# Patient Record
Sex: Male | Born: 1985
Health system: Southern US, Community
[De-identification: ages and names within clinical notes are randomized; demographics above are authoritative.]

## PROBLEM LIST (undated history)

## (undated) DIAGNOSIS — E119 Type 2 diabetes mellitus without complications: Secondary | ICD-10-CM

## (undated) DIAGNOSIS — I10 Essential (primary) hypertension: Secondary | ICD-10-CM

## (undated) DIAGNOSIS — I639 Cerebral infarction, unspecified: Secondary | ICD-10-CM

## (undated) HISTORY — PX: TONSILLECTOMY: SUR1361

## (undated) HISTORY — DX: Morbid (severe) obesity due to excess calories: E66.01

---
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
## 2016-08-13 DIAGNOSIS — R930 Abnormal findings on diagnostic imaging of skull and head, not elsewhere classified: Secondary | ICD-10-CM | POA: Diagnosis not present

## 2016-08-13 DIAGNOSIS — G4482 Headache associated with sexual activity: Secondary | ICD-10-CM | POA: Diagnosis not present

## 2016-08-13 DIAGNOSIS — R51 Headache: Secondary | ICD-10-CM | POA: Diagnosis not present

## 2016-09-13 DIAGNOSIS — R9089 Other abnormal findings on diagnostic imaging of central nervous system: Secondary | ICD-10-CM | POA: Diagnosis not present

## 2016-09-13 DIAGNOSIS — G4482 Headache associated with sexual activity: Secondary | ICD-10-CM | POA: Diagnosis not present

## 2016-09-13 DIAGNOSIS — I10 Essential (primary) hypertension: Secondary | ICD-10-CM | POA: Diagnosis not present

## 2016-09-23 DIAGNOSIS — G4482 Headache associated with sexual activity: Secondary | ICD-10-CM | POA: Diagnosis not present

## 2016-09-23 DIAGNOSIS — R51 Headache: Secondary | ICD-10-CM | POA: Diagnosis not present

## 2016-09-23 DIAGNOSIS — R9089 Other abnormal findings on diagnostic imaging of central nervous system: Secondary | ICD-10-CM | POA: Diagnosis not present

## 2016-10-18 DIAGNOSIS — J101 Influenza due to other identified influenza virus with other respiratory manifestations: Secondary | ICD-10-CM | POA: Diagnosis not present

## 2016-11-07 DIAGNOSIS — G459 Transient cerebral ischemic attack, unspecified: Secondary | ICD-10-CM | POA: Diagnosis not present

## 2016-11-07 DIAGNOSIS — I638 Other cerebral infarction: Secondary | ICD-10-CM | POA: Diagnosis not present

## 2016-11-22 DIAGNOSIS — I67841 Reversible cerebrovascular vasoconstriction syndrome: Secondary | ICD-10-CM | POA: Diagnosis not present

## 2016-11-22 DIAGNOSIS — I10 Essential (primary) hypertension: Secondary | ICD-10-CM | POA: Diagnosis not present

## 2016-11-22 DIAGNOSIS — G4482 Headache associated with sexual activity: Secondary | ICD-10-CM | POA: Diagnosis not present

## 2016-11-22 DIAGNOSIS — I638 Other cerebral infarction: Secondary | ICD-10-CM | POA: Diagnosis not present

## 2017-02-05 DIAGNOSIS — E119 Type 2 diabetes mellitus without complications: Secondary | ICD-10-CM | POA: Diagnosis not present

## 2017-02-05 DIAGNOSIS — I10 Essential (primary) hypertension: Secondary | ICD-10-CM | POA: Diagnosis not present

## 2017-02-05 DIAGNOSIS — Z6841 Body Mass Index (BMI) 40.0 and over, adult: Secondary | ICD-10-CM | POA: Diagnosis not present

## 2017-02-05 DIAGNOSIS — R748 Abnormal levels of other serum enzymes: Secondary | ICD-10-CM | POA: Diagnosis not present

## 2017-04-25 DIAGNOSIS — R21 Rash and other nonspecific skin eruption: Secondary | ICD-10-CM | POA: Diagnosis not present

## 2017-04-25 DIAGNOSIS — L2084 Intrinsic (allergic) eczema: Secondary | ICD-10-CM | POA: Diagnosis not present

## 2018-07-13 ENCOUNTER — Emergency Department (HOSPITAL_COMMUNITY)
Admission: EM | Admit: 2018-07-13 | Discharge: 2018-07-13 | Disposition: A | Discharge status: Home / Self Care | Payer: BLUE CROSS/BLUE SHIELD | Attending: Emergency Medicine | Admitting: Emergency Medicine

## 2018-07-13 ENCOUNTER — Other Ambulatory Visit: Payer: Self-pay

## 2018-07-13 ENCOUNTER — Encounter (HOSPITAL_COMMUNITY): Payer: Self-pay

## 2018-07-13 ENCOUNTER — Emergency Department (HOSPITAL_COMMUNITY): Payer: BLUE CROSS/BLUE SHIELD

## 2018-07-13 ENCOUNTER — Ambulatory Visit (HOSPITAL_COMMUNITY)
Admission: EM | Admit: 2018-07-13 | Discharge: 2018-07-13 | Disposition: A | Discharge status: Home / Self Care | Payer: Self-pay

## 2018-07-13 ENCOUNTER — Encounter (HOSPITAL_COMMUNITY): Payer: Self-pay | Admitting: Emergency Medicine

## 2018-07-13 DIAGNOSIS — R4182 Altered mental status, unspecified: Secondary | ICD-10-CM | POA: Diagnosis not present

## 2018-07-13 DIAGNOSIS — R05 Cough: Secondary | ICD-10-CM | POA: Diagnosis not present

## 2018-07-13 DIAGNOSIS — R51 Headache: Secondary | ICD-10-CM | POA: Diagnosis not present

## 2018-07-13 DIAGNOSIS — Z79899 Other long term (current) drug therapy: Secondary | ICD-10-CM | POA: Diagnosis not present

## 2018-07-13 DIAGNOSIS — R42 Dizziness and giddiness: Secondary | ICD-10-CM | POA: Insufficient documentation

## 2018-07-13 DIAGNOSIS — I10 Essential (primary) hypertension: Secondary | ICD-10-CM | POA: Insufficient documentation

## 2018-07-13 DIAGNOSIS — E119 Type 2 diabetes mellitus without complications: Secondary | ICD-10-CM | POA: Insufficient documentation

## 2018-07-13 DIAGNOSIS — Z8673 Personal history of transient ischemic attack (TIA), and cerebral infarction without residual deficits: Secondary | ICD-10-CM | POA: Diagnosis not present

## 2018-07-13 DIAGNOSIS — F17210 Nicotine dependence, cigarettes, uncomplicated: Secondary | ICD-10-CM | POA: Insufficient documentation

## 2018-07-13 DIAGNOSIS — J0110 Acute frontal sinusitis, unspecified: Secondary | ICD-10-CM | POA: Diagnosis not present

## 2018-07-13 HISTORY — DX: Cerebral infarction, unspecified: I63.9

## 2018-07-13 HISTORY — DX: Essential (primary) hypertension: I10

## 2018-07-13 HISTORY — DX: Type 2 diabetes mellitus without complications: E11.9

## 2018-07-13 LAB — CBC
HCT: 47 % (ref 39.0–52.0)
Hemoglobin: 16.1 g/dL (ref 13.0–17.0)
MCH: 29.9 pg (ref 26.0–34.0)
MCHC: 34.3 g/dL (ref 30.0–36.0)
MCV: 87.4 fL (ref 78.0–100.0)
PLATELETS: 318 10*3/uL (ref 150–400)
RBC: 5.38 MIL/uL (ref 4.22–5.81)
RDW: 12.2 % (ref 11.5–15.5)
WBC: 11.4 10*3/uL — ABNORMAL HIGH (ref 4.0–10.5)

## 2018-07-13 LAB — COMPREHENSIVE METABOLIC PANEL
ALBUMIN: 4.6 g/dL (ref 3.5–5.0)
ALK PHOS: 54 U/L (ref 38–126)
ALT: 68 U/L — ABNORMAL HIGH (ref 0–44)
AST: 36 U/L (ref 15–41)
Anion gap: 12 (ref 5–15)
BILIRUBIN TOTAL: 1.1 mg/dL (ref 0.3–1.2)
BUN: 11 mg/dL (ref 6–20)
CALCIUM: 9.6 mg/dL (ref 8.9–10.3)
CO2: 22 mmol/L (ref 22–32)
Chloride: 101 mmol/L (ref 98–111)
Creatinine, Ser: 1.15 mg/dL (ref 0.61–1.24)
GFR calc Af Amer: >60 mL/min (ref >60)
Glucose, Bld: 208 mg/dL — ABNORMAL HIGH (ref 70–99)
Potassium: 3.6 mmol/L (ref 3.5–5.1)
Sodium: 135 mmol/L (ref 135–145)
TOTAL PROTEIN: 7.7 g/dL (ref 6.5–8.1)

## 2018-07-13 LAB — PROTIME-INR
INR: 0.91
PROTHROMBIN TIME: 12.1 s (ref 11.4–15.2)

## 2018-07-13 LAB — I-STAT CHEM 8, ED
BUN: 11 mg/dL (ref 6–20)
CALCIUM ION: 1.14 mmol/L — AB (ref 1.15–1.40)
CREATININE: 1 mg/dL (ref 0.61–1.24)
Chloride: 102 mmol/L (ref 98–111)
GLUCOSE: 213 mg/dL — AB (ref 70–99)
HCT: 47 % (ref 39.0–52.0)
Hemoglobin: 16 g/dL (ref 13.0–17.0)
Potassium: 3.6 mmol/L (ref 3.5–5.1)
Sodium: 138 mmol/L (ref 135–145)
TCO2: 27 mmol/L (ref 22–32)

## 2018-07-13 LAB — DIFFERENTIAL
Abs Immature Granulocytes: 0.1 10*3/uL (ref 0.0–0.1)
BASOS PCT: 1 %
Basophils Absolute: 0.1 10*3/uL (ref 0.0–0.1)
EOS ABS: 0.2 10*3/uL (ref 0.0–0.7)
EOS PCT: 2 %
IMMATURE GRANULOCYTES: 1 %
Lymphocytes Relative: 25 %
Lymphs Abs: 2.8 10*3/uL (ref 0.7–4.0)
Monocytes Absolute: 0.8 10*3/uL (ref 0.1–1.0)
Monocytes Relative: 7 %
NEUTROS PCT: 64 %
Neutro Abs: 7.4 10*3/uL (ref 1.7–7.7)

## 2018-07-13 LAB — APTT: aPTT: 31 seconds (ref 24–36)

## 2018-07-13 LAB — I-STAT TROPONIN, ED: Troponin i, poc: 0 ng/mL (ref 0.00–0.08)

## 2018-07-13 MED ORDER — LISINOPRIL 10 MG PO TABS: 10.0000 mg | ORAL_TABLET | Freq: Every day | ORAL | 0 refills | Status: DC

## 2018-07-13 MED ORDER — LISINOPRIL 10 MG PO TABS
10.0000 mg | ORAL_TABLET | Freq: Once | ORAL | Status: AC
  Administered 2018-07-13: 10 mg via ORAL
  Filled 2018-07-13: qty 1

## 2018-07-13 MED ORDER — AMOXICILLIN-POT CLAVULANATE 875-125 MG PO TABS: 1.0000 | ORAL_TABLET | Freq: Two times a day (BID) | ORAL | 0 refills | Status: DC

## 2018-07-13 NOTE — ED Triage Notes (Signed)
PT reports history of hypertension, diabetes, TIA.   PT has had sinus symptoms for several days.   PT took a claritin at 0600 this AM and then took a nap.  PT noticed problems concentrating 0900. PT woke up from his nap feeling "spacy" and "not feeling right."  PT has been off of BP meds for 6 months.

## 2018-07-13 NOTE — Discharge Instructions (Signed)
Take tylenol 2 pills 4 times a day and motrin 4 pills 3 times a day.  Drink plenty of fluids.  Return for worsening shortness of breath, headache, confusion. Follow up with your family doctor.   

## 2018-07-13 NOTE — ED Triage Notes (Signed)
Per Dr. Milus Glazier, Pt sent to ED for work up based on past medical history. PT elects to walk over without assistance.

## 2018-07-13 NOTE — ED Provider Notes (Signed)
[facility] EMERGENCY DEPARTMENT Provider Note   CSN: [id number] Arrival date & time: 07/13/18  1237     History   Chief Complaint Chief Complaint  Patient presents with  . Hypertension    HPI Jared Ramsey is a 32 y.o. male.  32 yo M with a chief complaint of cough and congestion.  This been going on for the past couple days.  The patient took some Sudafed last night and then today he was talking to a friend on the phone and felt like he was having trouble keeping track of the conversation.  He felt somewhat lightheaded where he felt like he was high.  He denies fevers denies head injury.  The patient has a history of a remote stroke that was found on MRI.  This made him more concerned about his difficulty keeping track of the conversation.  He denies difficulty with speech or swallowing denies unilateral numbness or weakness.  He went to urgent care and they found his blood pressure to be significantly elevated and sent him here.   Hypertension  Associated symptoms include headaches. Pertinent negatives include no chest pain, no abdominal pain and no shortness of breath.    Past Medical History:  Diagnosis Date  . Diabetes mellitus without complication (HCC)   . Hypertension   . Stroke [facility])    "mini stroke"    There are no active problems to display for this patient.   Past Surgical History:  Procedure Laterality Date  . TONSILLECTOMY          Home Medications    Prior to Admission medications   Medication Sig Start Date End Date Taking? Authorizing Provider  amoxicillin-clavulanate (AUGMENTIN) 875-125 MG tablet Take 1 tablet by mouth 2 (two) times daily. 07/13/18   Melene Plan, DO  aspirin 81 MG chewable tablet Chew by mouth daily.    [provider]  lisinopril (PRINIVIL,ZESTRIL) 10 MG tablet Take 1 tablet (10 mg total) by mouth daily. 07/13/18   Melene Plan, DO  loratadine (CLARITIN) 10 MG tablet Take 10 mg by mouth daily.    [provider]    Family History History reviewed. No pertinent family history.  Social History Social History   Tobacco Use  . Smoking status: Current Every Day Smoker    Packs/day: 0.50    Types: Cigarettes  Substance Use Topics  . Alcohol use: Yes    Comment: occ  . Drug use: Never     Allergies   Codeine   Review of Systems Review of Systems  Constitutional: Negative for chills and fever.  HENT: Positive for congestion. Negative for facial swelling.   Eyes: Negative for discharge and visual disturbance.  Respiratory: Positive for cough. Negative for shortness of breath.   Cardiovascular: Negative for chest pain and palpitations.  Gastrointestinal: Negative for abdominal pain, diarrhea and vomiting.  Musculoskeletal: Negative for arthralgias and myalgias.  Skin: Negative for color change and rash.  Neurological: Positive for headaches. Negative for tremors and syncope.  Psychiatric/Behavioral: Negative for confusion and dysphoric mood.     Physical Exam Updated Vital Signs BP (!) 153/99   Pulse 97   Temp 99 F (37.2 C) (Oral)   Resp 20   Ht 6\' 1"  (1.854 m)   Wt (!) 142.9 kg   SpO2 97%   BMI 41.56 kg/m   Physical Exam  Constitutional: He is oriented to person, place, and time. He appears well-developed and well-nourished.  HENT:  Head: Normocephalic and  atraumatic.  Eyes: Pupils are equal, round, and reactive to light. EOM are normal.  Fundoscopic exam:      The right eye shows no papilledema.       The left eye shows no papilledema.  Neck: Normal range of motion. Neck supple. No JVD present.  Cardiovascular: Normal rate and regular rhythm. Exam reveals no gallop and no friction rub.  No murmur heard. Pulmonary/Chest: No respiratory distress. He has no wheezes.  Abdominal: He exhibits no distension. There is no rebound and no guarding.  Musculoskeletal: Normal range of motion.  Neurological: He is alert and oriented to person, place, and time. He  has normal strength. No cranial nerve deficit or sensory deficit. He displays a negative Romberg sign. Coordination and gait normal.  Skin: No rash noted. No pallor.  Psychiatric: He has a normal mood and affect. His behavior is normal.  Nursing note and vitals reviewed.    ED Treatments / Results  Labs (all labs ordered are listed, but only abnormal results are displayed) Labs Reviewed  CBC - Abnormal; Notable for the following components:      Result Value   WBC 11.4 (*)    All other components within normal limits  COMPREHENSIVE METABOLIC PANEL - Abnormal; Notable for the following components:   Glucose, Bld 208 (*)    ALT 68 (*)    All other components within normal limits  I-STAT CHEM 8, ED - Abnormal; Notable for the following components:   Glucose, Bld 213 (*)    Calcium, Ion 1.14 (*)    All other components within normal limits  PROTIME-INR  APTT  DIFFERENTIAL  I-STAT TROPONIN, ED    EKG EKG Interpretation  Date/Time:  Monday July 13 2018 12:53:58 EDT Ventricular Rate:  111 PR Interval:  156 QRS Duration: 80 QT Interval:  330 QTC Calculation: 448 R Axis:   64 Text Interpretation:  Sinus tachycardia Anterior infarct , age undetermined Abnormal ECG No old tracing to compare Confirmed by Melene Plan [phone]) on 07/13/2018 1:27:00 PM Also confirmed by Melene Plan [phone]), editor Barbette Hair [phone])  on 07/13/2018 1:33:42 PM   Radiology Ct Head Wo Contrast  Result Date: 07/13/2018 CLINICAL DATA:  Hypertension with altered mental status EXAM: CT HEAD WITHOUT CONTRAST TECHNIQUE: Contiguous axial images were obtained from the base of the skull through the vertex without intravenous contrast. COMPARISON:  None. FINDINGS: Brain: The ventricles are normal in size and configuration. There is no intracranial mass, hemorrhage, extra-axial fluid collection, or midline shift. Brain parenchyma appears normal. No evident acute infarct. Vascular: No hyperdense vessel. No appreciable  vascular calcification. Skull: Bony calvarium appears intact. Sinuses/Orbits: There is slight mucosal thickening in each maxillary antrum. There is mucosal thickening in several ethmoid air cells. Other visualized paranasal sinuses are clear. Orbits appear symmetric bilaterally. Other: Mastoid air cells are clear. IMPRESSION: Study within normal limits. Electronically Signed   By: Bretta Bang III M.D.   On: 07/13/2018 13:34    Procedures Procedures (including critical care time)  Medications Ordered in ED Medications  lisinopril (PRINIVIL,ZESTRIL) tablet 10 mg (has no administration in time range)     Initial Impression / Assessment and Plan / ED Course  I have reviewed the triage vital signs and the nursing notes.  Pertinent labs & imaging results that were available during my care of the patient were reviewed by me and considered in my medical decision making (see chart for details).      32 yo M with a  chief complaint of hypertension and difficulty keeping track of the conversation.  He denied any aphasia without event.  He now feels better and his blood pressure is significantly improved.  This without any intervention.  He is to be on lisinopril but stopped taking it and is waiting to see a new physician.  He has a benign neurologic exam.  I suspect his hypertension was due to him taking Sudafed.  No chest pain, diaphoresis, nausea or other acs symptoms. Normal fundiscopic exam, no unequal pulses, normal pulse ox without rales or sob.  Feel this is unlikely to be a Hypertensive Emergency and recent studies suggest no benefit for inpatient admission.  There are also no studies to my knowledge suggesting that patients with hypertensive urgency have increased risk for end organ disease.The patient will follow up closely with their PCP.  Compliance with their medication stressed.    Chester Holstein, Christell Constant EH, et al. Characteristics and outcomes of patients presenting with  hypertensive urgency in the office setting. JAMA Intern Med. 2016 Jul 1; 176(7): 981-8.    2:26 PM:  I have discussed the diagnosis/risks/treatment options with the patient and believe the pt to be eligible for discharge home to follow-up with PCP. We also discussed returning to the ED immediately if new or worsening sx occur. We discussed the sx which are most concerning (e.g., sudden worsening pain, fever, inability to tolerate by mouth) that necessitate immediate return. Medications administered to the patient during their visit and any new prescriptions provided to the patient are listed below.  Medications given during this visit Medications  lisinopril (PRINIVIL,ZESTRIL) tablet 10 mg (has no administration in time range)      The patient appears reasonably screen and/or stabilized for discharge and I doubt any other medical condition or other [facility] requiring further screening, evaluation, or treatment in the ED at this time prior to discharge.    Final Clinical Impressions(s) / ED Diagnoses   Final diagnoses:  Essential hypertension  Acute frontal sinusitis, recurrence not specified    ED Discharge Orders         Ordered    amoxicillin-clavulanate (AUGMENTIN) 875-125 MG tablet  2 times daily     07/13/18 1421    lisinopril (PRINIVIL,ZESTRIL) 10 MG tablet  Daily     07/13/18 1421           Melene Plan, DO 07/13/18 1426

## 2018-07-13 NOTE — ED Triage Notes (Signed)
Pt endorses sinus issues x 2 days, this morning pt was working and states "something didn't feel right in my head" Went to [facility] and BP 190 systolic. Pt states "I wasn't tracking people's conversations correctly while I was at work" Pt has hx of TIA 1 year ago and hx of htn and was on meds but stopped taking them due to changing doctors. Axox4. No neuro deficits.

## 2018-07-14 ENCOUNTER — Encounter (HOSPITAL_COMMUNITY): Payer: Self-pay

## 2018-07-14 ENCOUNTER — Emergency Department (HOSPITAL_COMMUNITY): Payer: BLUE CROSS/BLUE SHIELD

## 2018-07-14 ENCOUNTER — Emergency Department (HOSPITAL_COMMUNITY)
Admission: EM | Admit: 2018-07-14 | Discharge: 2018-07-15 | Disposition: A | Discharge status: Home / Self Care | Payer: BLUE CROSS/BLUE SHIELD | Attending: Emergency Medicine | Admitting: Emergency Medicine

## 2018-07-14 DIAGNOSIS — R51 Headache: Secondary | ICD-10-CM | POA: Diagnosis not present

## 2018-07-14 DIAGNOSIS — F17210 Nicotine dependence, cigarettes, uncomplicated: Secondary | ICD-10-CM | POA: Diagnosis not present

## 2018-07-14 DIAGNOSIS — E119 Type 2 diabetes mellitus without complications: Secondary | ICD-10-CM | POA: Diagnosis not present

## 2018-07-14 DIAGNOSIS — Z7982 Long term (current) use of aspirin: Secondary | ICD-10-CM | POA: Insufficient documentation

## 2018-07-14 DIAGNOSIS — G9389 Other specified disorders of brain: Secondary | ICD-10-CM | POA: Diagnosis not present

## 2018-07-14 DIAGNOSIS — Z720 Tobacco use: Secondary | ICD-10-CM | POA: Diagnosis not present

## 2018-07-14 DIAGNOSIS — I10 Essential (primary) hypertension: Secondary | ICD-10-CM | POA: Insufficient documentation

## 2018-07-14 DIAGNOSIS — E1169 Type 2 diabetes mellitus with other specified complication: Secondary | ICD-10-CM

## 2018-07-14 DIAGNOSIS — Z79899 Other long term (current) drug therapy: Secondary | ICD-10-CM | POA: Insufficient documentation

## 2018-07-14 DIAGNOSIS — I6523 Occlusion and stenosis of bilateral carotid arteries: Secondary | ICD-10-CM | POA: Diagnosis not present

## 2018-07-14 DIAGNOSIS — R519 Headache, unspecified: Secondary | ICD-10-CM

## 2018-07-14 DIAGNOSIS — R531 Weakness: Secondary | ICD-10-CM | POA: Diagnosis not present

## 2018-07-14 LAB — DIFFERENTIAL
Abs Immature Granulocytes: 0.04 10*3/uL (ref 0.00–0.07)
Basophils Absolute: 0.1 10*3/uL (ref 0.0–0.1)
Basophils Relative: 1 %
EOS PCT: 2 %
Eosinophils Absolute: 0.2 10*3/uL (ref 0.0–0.5)
Immature Granulocytes: 0 %
LYMPHS ABS: 2.5 10*3/uL (ref 0.7–4.0)
LYMPHS PCT: 23 %
MONOS PCT: 7 %
Monocytes Absolute: 0.8 10*3/uL (ref 0.1–1.0)
Neutro Abs: 7.3 10*3/uL (ref 1.7–7.7)
Neutrophils Relative %: 67 %

## 2018-07-14 LAB — COMPREHENSIVE METABOLIC PANEL
ALBUMIN: 4.7 g/dL (ref 3.5–5.0)
ALK PHOS: 54 U/L (ref 38–126)
ALT: 66 U/L — ABNORMAL HIGH (ref 0–44)
ANION GAP: 14 (ref 5–15)
AST: 33 U/L (ref 15–41)
BILIRUBIN TOTAL: 1.6 mg/dL — AB (ref 0.3–1.2)
BUN: 12 mg/dL (ref 6–20)
CALCIUM: 10.3 mg/dL (ref 8.9–10.3)
CO2: 22 mmol/L (ref 22–32)
Chloride: 101 mmol/L (ref 98–111)
Creatinine, Ser: 1.07 mg/dL (ref 0.61–1.24)
GFR calc non Af Amer: >60 mL/min (ref >60)
GLUCOSE: 222 mg/dL — AB (ref 70–99)
Potassium: 4.8 mmol/L (ref 3.5–5.1)
Sodium: 137 mmol/L (ref 135–145)
TOTAL PROTEIN: 7.9 g/dL (ref 6.5–8.1)

## 2018-07-14 LAB — CBC
HEMATOCRIT: 51 % (ref 39.0–52.0)
Hemoglobin: 16.7 g/dL (ref 13.0–17.0)
MCH: 28.9 pg (ref 26.0–34.0)
MCHC: 32.7 g/dL (ref 30.0–36.0)
MCV: 88.4 fL (ref 80.0–100.0)
Platelets: 367 10*3/uL (ref 150–400)
RBC: 5.77 MIL/uL (ref 4.22–5.81)
RDW: 12.4 % (ref 11.5–15.5)
WBC: 10.8 10*3/uL — ABNORMAL HIGH (ref 4.0–10.5)
nRBC: 0 % (ref 0.0–0.2)

## 2018-07-14 LAB — I-STAT TROPONIN, ED: Troponin i, poc: 0.01 ng/mL (ref 0.00–0.08)

## 2018-07-14 LAB — CBG MONITORING, ED: GLUCOSE-CAPILLARY: 221 mg/dL — AB (ref 70–99)

## 2018-07-14 LAB — RAPID URINE DRUG SCREEN, HOSP PERFORMED
Amphetamines: NOT DETECTED
BARBITURATES: NOT DETECTED
Benzodiazepines: NOT DETECTED
Cocaine: NOT DETECTED
OPIATES: NOT DETECTED
TETRAHYDROCANNABINOL: NOT DETECTED

## 2018-07-14 MED ORDER — IOPAMIDOL (ISOVUE-370) INJECTION 76%
50.0000 mL | Freq: Once | INTRAVENOUS | Status: AC | PRN
  Administered 2018-07-14: 50 mL via INTRAVENOUS

## 2018-07-14 MED ORDER — ACETAMINOPHEN 325 MG PO TABS
650.0000 mg | ORAL_TABLET | Freq: Once | ORAL | Status: AC
  Administered 2018-07-14: 650 mg via ORAL
  Filled 2018-07-14: qty 2

## 2018-07-14 MED ORDER — IOPAMIDOL (ISOVUE-370) INJECTION 76%
INTRAVENOUS | Status: AC
  Filled 2018-07-14: qty 50

## 2018-07-14 NOTE — ED Triage Notes (Signed)
Patient seen yesterday for right sided weakness and restarted on BP meds. Had labs and CT with no diagnosis. Today arrived anxious and reports right sided weakness. No arm drift, grip strength equal, NAD. No facial droop.

## 2018-07-14 NOTE — ED Provider Notes (Signed)
Patient presents today for evalution of right sided heaviness and headache.  He has a history of abnormal scans.  Please see note by Harolyn Rutherford PA-C for full details.     Physical Exam  BP 132/83 (BP Location: Right Arm)   Pulse 86   Temp 98.5 F (36.9 C) (Oral)   Resp 18   Ht 6\' 1"  (1.854 m)   Wt (!) 142.8 kg   SpO2 98%   BMI 41.54 kg/m   Physical Exam  Constitutional: He appears well-developed and well-nourished. No distress.  HENT:  Head: Normocephalic and atraumatic.  Eyes: Conjunctivae are normal. Right eye exhibits no discharge. Left eye exhibits no discharge. No scleral icterus.  Neck: Normal range of motion.  Cardiovascular: Normal rate and regular rhythm.  Pulmonary/Chest: Effort normal. No stridor. No respiratory distress.  Abdominal: He exhibits no distension.  Musculoskeletal: He exhibits no edema or deformity.  Neurological: He is alert. He exhibits normal muscle tone.  Moves all extremities spontaneously, speech is not slurred, no obvious facial droop.  His gait is normal without evidence of ataxia.  Skin: Skin is warm and dry. He is not diaphoretic.  Psychiatric: He has a normal mood and affect. His behavior is normal.  Nursing note and vitals reviewed.   ED Course/Procedures   Clinical Course as of Jul 15 9  Tue Jul 14, 2018  1752 Spoke with Dr. Wilford Corner, neurologist. Recommends CTA head and neck.   [[facility]]  2027 Spoke with Dr. Laurence Slate after neurologist shift change.  Reviewed patient's current and past imaging.  Recommends MR brain as well as a UDS.   [[facility]]  2343 Spoke with dr. Laurence Slate from neurology, he requests to start patient on 40 mg atorvastatin once a day, and have patient follow-up with neurology.   [EH]    Clinical Course User Index [EH] Cristina Gong, PA-C [[facility]] Joy, Shawn C, PA-C   Ct Angio Head W Or Wo Contrast  Result Date: 07/14/2018 CLINICAL DATA:  Sudden onset right-sided headache with right-sided extremity weakness. Headache is improved.  Weakness has resolved. EXAM: CT ANGIOGRAPHY HEAD AND NECK TECHNIQUE: Multidetector CT imaging of the head and neck was performed using the standard protocol during bolus administration of intravenous contrast. Multiplanar CT image reconstructions and MIPs were obtained to evaluate the vascular anatomy. Carotid stenosis measurements (when applicable) are obtained utilizing NASCET criteria, using the distal internal carotid diameter as the denominator. CONTRAST:  50mL ISOVUE-370 IOPAMIDOL (ISOVUE-370) INJECTION 76% COMPARISON:  Head CT from yesterday.  Brain MRI 09/23/2016 FINDINGS: CT HEAD FINDINGS Brain: Subtle low-density in the superior paramedian right cerebellum, reference MRI in 2017. No evidence of acute infarct, hemorrhage, hydrocephalus, or mass. Normal brain volume. Vascular: Negative Skull: Normal Sinuses: Negative Orbits: Negative Review of the MIP images confirms the above findings CTA NECK FINDINGS Aortic arch: Partial coverage is negative.  Three vessel branching. Right carotid system: Mild wall thickening at the common carotid bifurcation attributed to noncalcified atherosclerosis. No ulceration or flow limiting stenosis. Left carotid system: Vessels are smooth and widely patent. No detected atherosclerosis. Vertebral arteries: No proximal subclavian stenosis. There is 50% narrowing at the origin of the left vertebral artery due to low-density plaque. No vertebral beading. Skeleton: No acute or aggressive finding Other neck: No evidence of mass or inflammation Upper chest: Negative Review of the MIP images confirms the above findings CTA HEAD FINDINGS Anterior circulation: The siphons show no atheromatous changes or narrowing. Irregularity at the right M1 segment on prior MRA is not  seen today. No branch occlusion or beading seen on either side. Negative for aneurysm. Posterior circulation: Mild left vertebral artery dominance. The vertebral and basilar arteries are smooth and widely patent Venous  sinuses: Patent Anatomic variants: Negative Delayed phase: No abnormal intracranial enhancement Review of the MIP images confirms the above findings IMPRESSION: 1. No emergent finding or specific explanation for symptoms. 2. Mild noncalcified plaque at the right common carotid bifurcation and left vertebral origin. There is related 50% narrowing at the left vertebral origin. Electronically Signed   By: Marnee Spring M.D.   On: 07/14/2018 19:58   Ct Head Wo Contrast  Result Date: 07/13/2018 CLINICAL DATA:  Hypertension with altered mental status EXAM: CT HEAD WITHOUT CONTRAST TECHNIQUE: Contiguous axial images were obtained from the base of the skull through the vertex without intravenous contrast. COMPARISON:  None. FINDINGS: Brain: The ventricles are normal in size and configuration. There is no intracranial mass, hemorrhage, extra-axial fluid collection, or midline shift. Brain parenchyma appears normal. No evident acute infarct. Vascular: No hyperdense vessel. No appreciable vascular calcification. Skull: Bony calvarium appears intact. Sinuses/Orbits: There is slight mucosal thickening in each maxillary antrum. There is mucosal thickening in several ethmoid air cells. Other visualized paranasal sinuses are clear. Orbits appear symmetric bilaterally. Other: Mastoid air cells are clear. IMPRESSION: Study within normal limits. Electronically Signed   By: Bretta Bang III M.D.   On: 07/13/2018 13:34   Ct Angio Neck W And/or Wo Contrast  Result Date: 07/14/2018 CLINICAL DATA:  Sudden onset right-sided headache with right-sided extremity weakness. Headache is improved. Weakness has resolved. EXAM: CT ANGIOGRAPHY HEAD AND NECK TECHNIQUE: Multidetector CT imaging of the head and neck was performed using the standard protocol during bolus administration of intravenous contrast. Multiplanar CT image reconstructions and MIPs were obtained to evaluate the vascular anatomy. Carotid stenosis measurements (when  applicable) are obtained utilizing NASCET criteria, using the distal internal carotid diameter as the denominator. CONTRAST:  50mL ISOVUE-370 IOPAMIDOL (ISOVUE-370) INJECTION 76% COMPARISON:  Head CT from yesterday.  Brain MRI 09/23/2016 FINDINGS: CT HEAD FINDINGS Brain: Subtle low-density in the superior paramedian right cerebellum, reference MRI in 2017. No evidence of acute infarct, hemorrhage, hydrocephalus, or mass. Normal brain volume. Vascular: Negative Skull: Normal Sinuses: Negative Orbits: Negative Review of the MIP images confirms the above findings CTA NECK FINDINGS Aortic arch: Partial coverage is negative.  Three vessel branching. Right carotid system: Mild wall thickening at the common carotid bifurcation attributed to noncalcified atherosclerosis. No ulceration or flow limiting stenosis. Left carotid system: Vessels are smooth and widely patent. No detected atherosclerosis. Vertebral arteries: No proximal subclavian stenosis. There is 50% narrowing at the origin of the left vertebral artery due to low-density plaque. No vertebral beading. Skeleton: No acute or aggressive finding Other neck: No evidence of mass or inflammation Upper chest: Negative Review of the MIP images confirms the above findings CTA HEAD FINDINGS Anterior circulation: The siphons show no atheromatous changes or narrowing. Irregularity at the right M1 segment on prior MRA is not seen today. No branch occlusion or beading seen on either side. Negative for aneurysm. Posterior circulation: Mild left vertebral artery dominance. The vertebral and basilar arteries are smooth and widely patent Venous sinuses: Patent Anatomic variants: Negative Delayed phase: No abnormal intracranial enhancement Review of the MIP images confirms the above findings IMPRESSION: 1. No emergent finding or specific explanation for symptoms. 2. Mild noncalcified plaque at the right common carotid bifurcation and left vertebral origin. There is related 50%  narrowing  at the left vertebral origin. Electronically Signed   By: Marnee Spring M.D.   On: 07/14/2018 19:58   Mr Brain Wo Contrast  Result Date: 07/14/2018 CLINICAL DATA:  Right-sided weakness. EXAM: MRI HEAD WITHOUT CONTRAST TECHNIQUE: Multiplanar, multiecho pulse sequences of the brain and surrounding structures were obtained without intravenous contrast. COMPARISON:  Head CT 07/14/2018 Brain MRI 09/23/2016 FINDINGS: BRAIN: There is no acute infarct, acute hemorrhage, hydrocephalus or extra-axial collection. The midline structures are normal. No midline shift or other mass effect. There is a focus of hyperintense T2-weighted signal along the medial superior surface of the right cerebellar hemisphere (series 10, image 11). This is unchanged from the prior examination of 09/23/2016. The white matter signal is normal for the patient's age. The cerebral and cerebellar volume are age-appropriate. Susceptibility-sensitive sequences show no chronic microhemorrhage or superficial siderosis. VASCULAR: Major intracranial arterial and venous sinus flow voids are normal. SKULL AND UPPER CERVICAL SPINE: Calvarial bone marrow signal is normal. There is no skull base mass. Visualized upper cervical spine and soft tissues are normal. SINUSES/ORBITS: No fluid levels or advanced mucosal thickening. No mastoid or middle ear effusion. The orbits are normal. IMPRESSION: 1. Unchanged appearance of hyperintense T2-weighted signal at the superomedial right cerebellar hemisphere, suspected to be the sequela of remote ischemic incident. 2. Otherwise normal MRI of the brain. Electronically Signed   By: Deatra Robinson M.D.   On: 07/14/2018 22:49    Procedures  MDM  Plan per previous team: follow up on MRI, if normal patient can go home, if abnormal re-consult neurology.   MRI was reviewed by Dr. Rexanne Mano from neurology, he came and saw the patient and recommended discharge home.  He requested I discharge patient with a  prescription for 40 mg of atorvastatin daily.  Discussed this with patient, including possible side effects.  Also discussed mildly elevated ALT, he states that in an average weekend he will drink 12-24 beers, he does not drink at all during the week and does not have problems when he does not drink, advised significant decrease in alcohol consumption.  His blood sugar was also elevated at 222, patient has previously been diagnosed as diabetic, however has not been taking any metformin for the past year.  He does not have a primary care doctor.  Will re-prescribe metformin, he was instructed that as he received contrast today he needs to hold metformin for at least 3 days and states his understanding.  General cardiovascular risk reduction discussed.  Return precautions were discussed with patient who states their understanding.  At the time of discharge patient denied any unaddressed complaints or concerns.  Patient is agreeable for discharge home.        Cristina Gong, PA-C 07/15/18 0012    Doug Sou, MD 07/17/18 [id number]    Doug Sou, MD 07/17/18 [phone]

## 2018-07-14 NOTE — Consult Note (Signed)
Requesting Physician: Harolyn Rutherford PA-C    Chief Complaint: headache, right side weakness  History obtained from: Patient and Chart     HPI:                                                                                                                                       Jared Ramsey is an 32 y.o. male past medical history of diabetes mellitus, hypertension, RCVS, scleral headaches and old right cerebellar infarct presents to the emergency room after developing sudden onset headache while driving earlier today followed by mild right arm numbness and weakness.  Patient denies any slurred speech or facial droop. Patient was in the emergency room just prior day after presenting with confusion.  His blood pressure was elevated 180s to 200 systolic and this was thought to be related to Sudafed use.  Patient has a prior history of possible RCVS with MR angiogram showing right M1 irregularity.  He presented with headache after having coitus.  Also noted on workup included MRI brain which showed a chronic infarct in the right cerebellum.  He is being followed by [facility] neurology.  He is taking aspirin daily.   Past Medical History:  Diagnosis Date  . Diabetes mellitus without complication (HCC)   . Hypertension   . Stroke [facility])    "mini stroke"    Past Surgical History:  Procedure Laterality Date  . TONSILLECTOMY      No family history on file. Social History:  reports that he has been smoking cigarettes. He has been smoking about 0.50 packs per day. He does not have any smokeless tobacco history on file. He reports that he drinks alcohol. He reports that he does not use drugs.  Allergies:  Allergies  Allergen Reactions  . Codeine Swelling    Medications:                                                                                                                        I reviewed home medications   ROS:  14 systems reviewed and negative except above   Examination:                                                                                                      General: Appears well-developed  Psych: Affect appropriate to situation Eyes: No scleral injection HENT: No OP obstrucion Head: Normocephalic.  Cardiovascular: Normal rate and regular rhythm.  Respiratory: Effort normal and breath sounds normal to anterior ascultation GI: Soft.  No distension. There is no tenderness.  Skin: WDI    Neurological Examination Mental Status: Alert, oriented, thought content appropriate.  Speech fluent without evidence of aphasia. Able to follow 3 step commands without difficulty. Cranial Nerves: II: Visual fields grossly normal,  III,IV, VI: ptosis not present, extra-ocular motions intact bilaterally, pupils equal, round, reactive to light and accommodation V,VII: smile symmetric, facial light touch sensation normal bilaterally VIII: hearing normal bilaterally IX,X: uvula rises symmetrically XI: bilateral shoulder shrug XII: midline tongue extension Motor: Right : Upper extremity   5/5    Left:     Upper extremity   5/5  Lower extremity   5/5     Lower extremity   5/5 Tone and bulk:normal tone throughout; no atrophy noted Sensory: Pinprick and light touch intact throughout, bilaterally Deep Tendon Reflexes: 2+ and symmetric throughout Plantars: Right: downgoing   Left: downgoing Cerebellar: normal finger-to-nose, normal rapid alternating movements and normal heel-to-shin test Gait: normal gait and station     Lab Results: Basic Metabolic Panel: Recent Labs  Lab 07/13/18 1300 07/13/18 1313 07/14/18 1500  NA 135 138 137  K 3.6 3.6 4.8  CL 101 102 101  CO2 22  --  22  GLUCOSE 208* 213* 222*  BUN 11 11 12   CREATININE 1.15 1.00 1.07  CALCIUM 9.6  --  10.3    CBC: Recent Labs  Lab 07/13/18 1300 07/13/18 1313 07/14/18 1500   WBC 11.4*  --  10.8*  NEUTROABS 7.4  --  7.3  HGB 16.1 16.0 16.7  HCT 47.0 47.0 51.0  MCV 87.4  --  88.4  PLT 318  --  367    Coagulation Studies: Recent Labs    07/13/18 1300  LABPROT 12.1  INR 0.91    Imaging: Ct Angio Head W Or Wo Contrast  Result Date: 07/14/2018 CLINICAL DATA:  Sudden onset right-sided headache with right-sided extremity weakness. Headache is improved. Weakness has resolved. EXAM: CT ANGIOGRAPHY HEAD AND NECK TECHNIQUE: Multidetector CT imaging of the head and neck was performed using the standard protocol during bolus administration of intravenous contrast. Multiplanar CT image reconstructions and MIPs were obtained to evaluate the vascular anatomy. Carotid stenosis measurements (when applicable) are obtained utilizing NASCET criteria, using the distal internal carotid diameter as the denominator. CONTRAST:  96mLGuinea-Bis[place]wood [id number] H195mEuGuinea-[place]isEllwood [id number]a-[place]isEllwood [id number]-BisEllwood [id number]a-BisEllwood [id number]-[place]isEll[place] [id number]-[place] Handle854(641SewardMidd1927mEuGuinea-[place]is[place] [id number]-[place]is[place] Handle854(678)SewardW1968mEuGuinea-[place]isEll[place] Handle854(219SewardNa195mEuGu[place] Handle854917SewardB1962mEuGuinea-[place] Handle854(586SewardSy1943mEuGuinea-[place]isEll[place] Handle854305Sewa1970mEuGu[place] Handle8544SewardRed1965mEuGuinea-[place] Handle854(913SewardB1983mEuGuinea-[place]isEll[place] [id number]ea-[place] Handle8549SewardWhite1953mEuGuinea-[place]isEll[place] Handle854(579Sewar19105mEuGuinea-BisEllwood [id number]-BisEllwood [id number]-BisEllwood [id number]-BisEllwood [id number]-BisEllwood [id number] Moore IOPAMIDOL (ISOVUE-370) INJECTION 76% COMPARISON:  Head CT from yesterday.  Brain MRI 09/23/2016 FINDINGS: CT HEAD FINDINGS Brain: Subtle low-density in the superior paramedian right cerebellum, reference MRI in 2017. No evidence of acute infarct, hemorrhage, hydrocephalus, or mass. Normal brain volume. Vascular: Negative Skull: Normal Sinuses: Negative Orbits: Negative Review  of the MIP images confirms the above findings CTA NECK FINDINGS Aortic arch: Partial coverage is negative.  Three vessel branching. Right carotid system: Mild wall thickening at the common carotid bifurcation attributed to noncalcified atherosclerosis. No ulceration or flow limiting stenosis. Left carotid system: Vessels are smooth and widely patent. No detected atherosclerosis. Vertebral arteries: No proximal subclavian stenosis. There is 50% narrowing at the origin of the left vertebral artery due to low-density plaque. No vertebral beading. Skeleton: No acute or aggressive finding Other neck: No evidence of mass or inflammation Upper chest: Negative Review of the MIP images  confirms the above findings CTA HEAD FINDINGS Anterior circulation: The siphons show no atheromatous changes or narrowing. Irregularity at the right M1 segment on prior MRA is not seen today. No branch occlusion or beading seen on either side. Negative for aneurysm. Posterior circulation: Mild left vertebral artery dominance. The vertebral and basilar arteries are smooth and widely patent Venous sinuses: Patent Anatomic variants: Negative Delayed phase: No abnormal intracranial enhancement Review of the MIP images confirms the above findings IMPRESSION: 1. No emergent finding or specific explanation for symptoms. 2. Mild noncalcified plaque at the right common carotid bifurcation and left vertebral origin. There is related 50% narrowing at the left vertebral origin. Electronically Signed   By: Marnee Spring M.D.   On: 07/14/2018 19:58   Ct Head Wo Contrast  Result Date: 07/13/2018 CLINICAL DATA:  Hypertension with altered mental status EXAM: CT HEAD WITHOUT CONTRAST TECHNIQUE: Contiguous axial images were obtained from the base of the skull through the vertex without intravenous contrast. COMPARISON:  None. FINDINGS: Brain: The ventricles are normal in size and configuration. There is no intracranial mass, hemorrhage, extra-axial fluid collection, or midline shift. Brain parenchyma appears normal. No evident acute infarct. Vascular: No hyperdense vessel. No appreciable vascular calcification. Skull: Bony calvarium appears intact. Sinuses/Orbits: There is slight mucosal thickening in each maxillary antrum. There is mucosal thickening in several ethmoid air cells. Other visualized paranasal sinuses are clear. Orbits appear symmetric bilaterally. Other: Mastoid air cells are clear. IMPRESSION: Study within normal limits. Electronically Signed   By: Bretta Bang III M.D.   On: 07/13/2018 13:34   Ct Angio Neck W And/or Wo Contrast  Result Date: 07/14/2018 CLINICAL DATA:  Sudden onset right-sided headache  with right-sided extremity weakness. Headache is improved. Weakness has resolved. EXAM: CT ANGIOGRAPHY HEAD AND NECK TECHNIQUE: Multidetector CT imaging of the head and neck was performed using the standard protocol during bolus administration of intravenous contrast. Multiplanar CT image reconstructions and MIPs were obtained to evaluate the vascular anatomy. Carotid stenosis measurements (when applicable) are obtained utilizing NASCET criteria, using the distal internal carotid diameter as the denominator. CONTRAST:  50mL ISOVUE-370 IOPAMIDOL (ISOVUE-370) INJECTION 76% COMPARISON:  Head CT from yesterday.  Brain MRI 09/23/2016 FINDINGS: CT HEAD FINDINGS Brain: Subtle low-density in the superior paramedian right cerebellum, reference MRI in 2017. No evidence of acute infarct, hemorrhage, hydrocephalus, or mass. Normal brain volume. Vascular: Negative Skull: Normal Sinuses: Negative Orbits: Negative Review of the MIP images confirms the above findings CTA NECK FINDINGS Aortic arch: Partial coverage is negative.  Three vessel branching. Right carotid system: Mild wall thickening at the common carotid bifurcation attributed to noncalcified atherosclerosis. No ulceration or flow limiting stenosis. Left carotid system: Vessels are smooth and widely patent. No detected atherosclerosis. Vertebral arteries: No proximal subclavian stenosis. There is 50% narrowing at the origin of the left vertebral artery due to low-density plaque. No vertebral beading. Skeleton: No acute  or aggressive finding Other neck: No evidence of mass or inflammation Upper chest: Negative Review of the MIP images confirms the above findings CTA HEAD FINDINGS Anterior circulation: The siphons show no atheromatous changes or narrowing. Irregularity at the right M1 segment on prior MRA is not seen today. No branch occlusion or beading seen on either side. Negative for aneurysm. Posterior circulation: Mild left vertebral artery dominance. The vertebral  and basilar arteries are smooth and widely patent Venous sinuses: Patent Anatomic variants: Negative Delayed phase: No abnormal intracranial enhancement Review of the MIP images confirms the above findings IMPRESSION: 1. No emergent finding or specific explanation for symptoms. 2. Mild noncalcified plaque at the right common carotid bifurcation and left vertebral origin. There is related 50% narrowing at the left vertebral origin. Electronically Signed   By: Marnee Spring M.D.   On: 07/14/2018 19:58     ASSESSMENT AND PLAN  32 year old male with past medical history diabetes, hypertension, right cerebellar infarct, RCVS and post coital headaches who presents to the emergency room with sudden onset headache, followed by mild right-sided weakness. Symptoms lasted approximately 2 hours and gradually subsided.  CTA negative for any vasoconstriction, significant stenosis.  Shows mild to moderate atherosclerotic changes.  No longer showing irregularity in the right M1 seen on  Prior MR Angiogram.  MRI brain negative for acute stroke, read demonstrates FLAIR changes in the cerebellum likely representing chronic infarct.  Patient description of episode with sudden onset headache, recent use of Sudafed and prior history of RCVS -I do suspect this may have been similar event.  Patient is back to baseline before receiving any imaging.  Low suspicion that this was an embolic event and therefore yield of echocardiogram will likely be low and can be done as outpatient per the discretion of his neurologist at [facility].    Transient ischemic attack vs RCVS vs Complicated migraine    Recommendations Continue aspirin Will add atorvastatin 40 mg Avoid any decongestants, stimulants Follow up with outpatient neurologist at [facility] or Dr. Pearlean Brownie at [facility] Hallis Meditz [facility] Pager Number [phone]

## 2018-07-14 NOTE — Discharge Instructions (Addendum)
Please do not start taking your metformin for at least 3 days from today.  There were no acute abnormalities noted on the MRI today.   Headache  For future headaches please try the following regimen: Antiinflammatory medications: Take 600 mg of ibuprofen every 6 hours or 440 mg (over the counter dose) to 500 mg (prescription dose) of naproxen every 12 hours for the next 3 days. After this time, these medications may be used as needed for pain. Take these medications with food to avoid upset stomach. Choose only one of these medications, do not take them together. Tylenol: Should you continue to have additional pain while taking the ibuprofen or naproxen, you may add in tylenol as needed. Your daily total maximum amount of tylenol from all sources should be limited to 4000mg /day for persons without liver problems, or 2000mg /day for those with liver problems.  Hydration: Have a goal of about a half liter of water every couple hours to stay well hydrated.   Sleep: Please be sure to get plenty of sleep with a goal of 8 hours per night. Having a regular bed time and bedtime routine can help with this.  Screens: Reduce the amount of time you are in front of screens.  Take about a 5-10-minute break every hour or every couple hours to give your eyes rest.  Do not use screens in dark rooms.  Glasses with a blue light filter may also help reduce eye fatigue.  Stress: Take steps to reduce stress as much as possible.   Follow up: Follow-up with your primary care provider on this issue and for management of your blood pressure.    You may also need to follow-up with the neurologist due to the presence of plaque abnormalities noted in some of your vessels.

## 2018-07-14 NOTE — ED Notes (Signed)
Pt transported to CT ?

## 2018-07-14 NOTE — ED Provider Notes (Addendum)
[facility] EMERGENCY DEPARTMENT Provider Note   CSN: [id number] Arrival date & time: 07/14/18  1424     History   Chief Complaint Chief Complaint  Patient presents with  . WEAKNESS/nausea    HPI Jared Ramsey is a 32 y.o. male.  HPI   Jared Ramsey is a 32 y.o. male, with a history of DM and HTN, presenting to the ED with right-sided weakness and headache. Around 1:30 PM today, patient was picking up his daughter from school and when he had onset of right-sided headache, nausea, vomiting, and dizziness, followed shortly thereafter by tingling and heaviness in the right upper and lower extremity. Symptoms lasted for approximately an hour and have improved.  His right arm and leg still "feel strange" and he retains a 3/10, pressure-type headache behind the right eye and in the right temporal region. Accompanied by a feeling of tightness in the right neck.   Denies syncope, confusion, chest pain, shortness of breath, vision loss, falls/trauma, or any other complaints.    Past Medical History:  Diagnosis Date  . Diabetes mellitus without complication (HCC)   . Hypertension   . Stroke [facility])    "mini stroke"    There are no active problems to display for this patient.   Past Surgical History:  Procedure Laterality Date  . TONSILLECTOMY          Home Medications    Prior to Admission medications   Medication Sig Start Date End Date Taking? Authorizing Provider  aspirin 81 MG chewable tablet Chew 81 mg by mouth daily.    Yes [provider]  lisinopril (PRINIVIL,ZESTRIL) 10 MG tablet Take 1 tablet (10 mg total) by mouth daily. 07/13/18  Yes Melene Plan, DO  loratadine (CLARITIN) 10 MG tablet Take 10 mg by mouth daily.   Yes [provider]  amoxicillin-clavulanate (AUGMENTIN) 875-125 MG tablet Take 1 tablet by mouth 2 (two) times daily. 07/13/18   Melene Plan, DO    Family History No family history on file.  Social  History Social History   Tobacco Use  . Smoking status: Current Every Day Smoker    Packs/day: 0.50    Types: Cigarettes  Substance Use Topics  . Alcohol use: Yes    Comment: occ  . Drug use: Never     Allergies   Codeine   Review of Systems Review of Systems  Constitutional: Negative for diaphoresis.  Eyes: Negative for visual disturbance.  Respiratory: Negative for shortness of breath.   Cardiovascular: Negative for chest pain.  Gastrointestinal: Positive for nausea and vomiting. Negative for abdominal pain.  Neurological: Positive for dizziness, weakness, numbness and headaches. Negative for syncope.  All other systems reviewed and are negative.    Physical Exam Updated Vital Signs BP (!) 182/123 (BP Location: Left Arm)   Pulse (!) 102   Temp 98.7 F (37.1 C) (Oral)   Resp 20   SpO2 99%   Physical Exam  Constitutional: He is oriented to person, place, and time. He appears well-developed and well-nourished. No distress.  HENT:  Head: Normocephalic and atraumatic.  Mouth/Throat: Oropharynx is clear and moist.  Eyes: Pupils are equal, round, and reactive to light. Conjunctivae and EOM are normal.  Neck: Normal range of motion. Neck supple.  No change in the patient's symptoms with movement of the neck.  Cardiovascular: Normal rate, regular rhythm, normal heart sounds and intact distal pulses.  Pulmonary/Chest: Effort normal and breath sounds normal. No respiratory distress.  Abdominal: Soft.  There is no tenderness. There is no guarding.  Musculoskeletal: He exhibits no edema.  Normal motor function intact in all extremities. No midline spinal tenderness.   Lymphadenopathy:    He has no cervical adenopathy.  Neurological: He is alert and oriented to person, place, and time.  Sensation grossly intact to light touch in the extremities. No noted speech deficits. No aphasia. Patient handles oral secretions without difficulty. No noted swallowing defects.  Equal  grip strength bilaterally. Strength 5/5 in the upper extremities. Strength 5/5 in the lower extremities. Negative Romberg. No gait disturbance.  Coordination intact including heel to shin and finger to nose.  Cranial nerves III-XII grossly intact.  No facial droop.   Skin: Skin is warm and dry. He is not diaphoretic.  Psychiatric: He has a normal mood and affect. His behavior is normal.  Nursing note and vitals reviewed.    ED Treatments / Results  Labs (all labs ordered are listed, but only abnormal results are displayed) Labs Reviewed  CBC - Abnormal; Notable for the following components:      Result Value   WBC 10.8 (*)    All other components within normal limits  COMPREHENSIVE METABOLIC PANEL - Abnormal; Notable for the following components:   Glucose, Bld 222 (*)    ALT 66 (*)    Total Bilirubin 1.6 (*)    All other components within normal limits  CBG MONITORING, ED - Abnormal; Notable for the following components:   Glucose-Capillary 221 (*)    All other components within normal limits  DIFFERENTIAL  I-STAT TROPONIN, ED    EKG None  Radiology Ct Head Wo Contrast  Result Date: 07/13/2018 CLINICAL DATA:  Hypertension with altered mental status EXAM: CT HEAD WITHOUT CONTRAST TECHNIQUE: Contiguous axial images were obtained from the base of the skull through the vertex without intravenous contrast. COMPARISON:  None. FINDINGS: Brain: The ventricles are normal in size and configuration. There is no intracranial mass, hemorrhage, extra-axial fluid collection, or midline shift. Brain parenchyma appears normal. No evident acute infarct. Vascular: No hyperdense vessel. No appreciable vascular calcification. Skull: Bony calvarium appears intact. Sinuses/Orbits: There is slight mucosal thickening in each maxillary antrum. There is mucosal thickening in several ethmoid air cells. Other visualized paranasal sinuses are clear. Orbits appear symmetric bilaterally. Other: Mastoid air  cells are clear. IMPRESSION: Study within normal limits. Electronically Signed   By: Bretta Bang III M.D.   On: 07/13/2018 13:34    Procedures Procedures (including critical care time)  Medications Ordered in ED Medications - No data to display   Initial Impression / Assessment and Plan / ED Course  I have reviewed the triage vital signs and the nursing notes.  Pertinent labs & imaging results that were available during my care of the patient were reviewed by me and considered in my medical decision making (see chart for details).  Clinical Course as of Jul 17 729  Tue Jul 14, 2018  1752 Spoke with Dr. Wilford Corner, neurologist. Recommends CTA head and neck.   [SJ]  2027 Spoke with Dr. Laurence Slate after neurologist shift change.  Reviewed patient's current and past imaging.  Recommends MR brain as well as a UDS.   [SJ]  2343 Spoke with dr. Laurence Slate from neurology, he requests to start patient on 40 mg atorvastatin once a day, and have patient follow-up with neurology.   [EH]    Clinical Course User Index [EH] Cristina Gong, PA-C [SJ] Anselm Pancoast, PA-C    Patient  presents with acute onset headache with subjective right-sided strength and sensory deficits.  No focal neuro deficits upon my examination.  Patient's headache resolved early in ED course and did not recur.  Findings and plan of care discussed with Doug Sou, MD.    End of shift patient care handoff report given to Lyndel Safe, PA-C. Plan: MRI pending.  If no acute abnormalities, patient may be discharged.  He will follow-up with primary care provider and neurology.   Carotid duplex ultrasound (11/07/16) 40-59% stenosis in the proximal RIGHT internal carotid artery. There appears  to be antegrade flow in the vertebral artery.  1-39% stenosis in the LEFT internal carotid artery. There appears to be  antegrade flow in the vertebral artery.  MRI brain w/o contrast (08/13/16): Cluster of small signal  abnormalities in the high right cerebellum. In the current clinical setting and MRA findings, question subacute infarct. Post infectious/inflammatory process is considered, and postcontrast imaging is recommended. This would be an unusual morphology for neoplastic process, but may need surveillance.  MRA head w/o contrast (08/13/16): 1. Narrowed irregular appearance of the proximal right MCA, suspect reversible cerebral vasoconstrictive syndrome in this patient with recent onset post coital headaches. Please see discussion above. 2. Unremarkable right superior cerebellar artery, noted given contemporaneous brain MR findings.  Final Clinical Impressions(s) / ED Diagnoses   Final diagnoses:  None    ED Discharge Orders    None       Concepcion Living 07/14/18 2212    Doug Sou, MD 07/14/18 1610    Doug Sou, MD 07/17/18 0730

## 2018-07-15 MED ORDER — ATORVASTATIN CALCIUM 40 MG PO TABS: 40.0000 mg | ORAL_TABLET | Freq: Every day | ORAL | 1 refills | Status: DC

## 2018-07-15 MED ORDER — METFORMIN HCL 500 MG PO TABS: 500.0000 mg | ORAL_TABLET | Freq: Two times a day (BID) | ORAL | 1 refills | Status: DC

## 2018-07-16 DIAGNOSIS — Z8673 Personal history of transient ischemic attack (TIA), and cerebral infarction without residual deficits: Secondary | ICD-10-CM | POA: Insufficient documentation

## 2018-07-16 DIAGNOSIS — Z9109 Other allergy status, other than to drugs and biological substances: Secondary | ICD-10-CM | POA: Insufficient documentation

## 2018-07-16 HISTORY — DX: Personal history of transient ischemic attack (TIA), and cerebral infarction without residual deficits: Z86.73

## 2018-07-16 NOTE — Progress Notes (Signed)
Complete Physical  Assessment and Plan:  Jared Ramsey was seen today for establish care.  Diagnoses and all orders for this visit:  Encounter for medical examination to establish care  Hypertension, unspecified type Continue medication- lisinopril 10 mg daily Monitor blood pressure at home; call if consistently over 130/80 Continue DASH diet.   Reminder to go to the ER if any CP, SOB, nausea, dizziness, severe HA, changes vision/speech, left arm numbness and tingling and jaw pain. -     CBC with Differential/Platelet -     COMPLETE METABOLIC PANEL WITH GFR -     Magnesium  Type 2 diabetes mellitus with other specified complication, without long-term current use of insulin [facility]) Education: Reviewed 'ABCs' of diabetes management (respective goals in parentheses):  A1C (<7), blood pressure (<130/80), and cholesterol (LDL <70) Eye Exam yearly and Dental Exam every 6 months. Dietary recommendations Physical Activity recommendations -     Hemoglobin A1c -     Urinalysis, Routine w reflex microscopic  History of CVA (cerebrovascular accident) Follow up neurology as scheduled -     Lipid panel  Morbid obesity (HCC) Long discussion about weight loss, diet, and exercise Recommended diet heavy in fruits and veggies and low in animal meats, cheeses, and dairy products, appropriate calorie intake Patient will work on cutting down on portions, avoiding binge eating/drinking Discussed appropriate weight for height and initial goal (275lb) Follow up at next visit  Environmental allergies Continue OTC allergy pills  Alcohol consumption binge drinking Has stopped drinking- discussed avoiding binge drinking, aim for max 1-2 drinks/day Less is best  Former smoker Has quit smoking Strategies for ongoing success discussed  Stenosis of right carotid artery -     Lipid panel  Hyperlipidemia associated with type 2 diabetes mellitus (HCC) -     Lipid panel -     TSH  Discussed med's effects  and SE's. Screening labs and tests as requested with regular follow-up as recommended. Over 40 minutes of exam, counseling, chart review and critical decision making was performed   Future Appointments  Date Time Provider Department Center  07/30/2018  9:30 AM Micki Riley, MD [facility] None  10/26/2018  4:30 PM Judd Gaudier, NP [facility] None  07/27/2019  2:00 PM Judd Gaudier, NP [facility] None    HPI BP 122/82   Pulse 88   Temp (!) 97.5 F (36.4 C)   Ht 6' 0.25" (1.835 m)   Wt (!) 308 lb 12.8 oz (140.1 kg)   SpO2 98%   BMI 41.59 kg/m   32 y.o. male patient, married, has 12 y/o daughter, presents to establish care. He has Diabetes mellitus (HCC); Hypertension; History of CVA (cerebrovascular accident); Morbid obesity (HCC); Environmental allergies; Alcohol consumption binge drinking; Former smoker; Stenosis of right carotid artery; Stroke (HCC); and Diabetes mellitus without complication (HCC) on their problem list. He has not seen a PCP in this past year. He has previous history of diabetes, but has not been on metformin.   He was recently on 10/8 evaluated in ED for right sided headache and extremity heaviness. CTA head showed Subtle low-density in the superior paramedian right cerebellum, compared to MRI in 2017. No evidence of acute infarct,hemorrhage, hydrocephalus, or mass. Normal brain volume. CTA neck showed No emergent finding or specific explanation for symptoms. Mild noncalcified plaque at the right common carotid bifurcation and left vertebral origin. There was related 50% narrowing at the left vertebral origin. Carotid study from 11/2016 demonstrates 40-59% stenosis in proximal R internal carotid  artery with antegrade flow in the vertebral artery. MRI brain from 08/2016 showed subtle abnormalities in high right cerebellum which were questioned for subacute infarct.    Headache and extremity heaviness resolved, and he was discharged after initiation of atorvastatin,  recommendation to cut down on weekend binge drinking (12-24 on weekend) and to follow up with PCP for diabetes management, and scheduled for outpatient neurology follow up with Dr. Delia Heady on 07/30/2018.   He also reports intermittent RUQ pain at night over the weekend, with cold sweats and nausea, dry heaving, emesis x1. Has had intermittently typically 2 AM or so. Was advised to stop ibuprofen, started on pepcid x 2 in PM. Reports didn't have symptoms last night.   BMI is Body mass index is 41.59 kg/m., he has been working on diet and exercise - trying to get out and walk more, going to the park, taking the stairs Wt Readings from Last 3 Encounters:  07/20/18 (!) 308 lb 12.8 oz (140.1 kg)  07/14/18 (!) 314 lb 13.1 oz (142.8 kg)  07/13/18 (!) 315 lb (142.9 kg)   His blood pressure has been controlled at home, today their BP is BP: 122/82 He does not workout. He denies chest pain, shortness of breath, dizziness.   He is on cholesterol medication (atorvastatin 40 mg daily) and denies myalgias.   The cholesterol last visit was:  No results found for: CHOL, HDL, LDLCALC, LDLDIRECT, TRIG, CHOLHDL   He has been working on diet and exercise for T2DM, he is on bASA, he is on ACE/ARB and denies foot ulcerations, increased appetite, nausea, polydipsia, polyuria, visual disturbances and vomiting. Last A1C in the office was: No results found for: HGBA1C   Last GFR: Lab Results  Component Value Date   GFRNONAA >60 07/14/2018   No results found for: VD25OH    Current Medications:  Current Outpatient Medications on File Prior to Visit  Medication Sig Dispense Refill  . aspirin 81 MG chewable tablet Chew 81 mg by mouth daily.     Marland Kitchen atorvastatin (LIPITOR) 40 MG tablet Take 1 tablet (40 mg total) by mouth daily. 30 tablet 1  . famotidine (PEPCID) 10 MG tablet Take 10 mg by mouth 2 (two) times daily.    Marland Kitchen lisinopril (PRINIVIL,ZESTRIL) 10 MG tablet Take 1 tablet (10 mg total) by mouth daily. 30  tablet 0  . loratadine (CLARITIN) 10 MG tablet Take 10 mg by mouth daily as needed.     . metFORMIN (GLUCOPHAGE) 500 MG tablet Take 1 tablet (500 mg total) by mouth 2 (two) times daily. 60 tablet 1   No current facility-administered medications on file prior to visit.    Allergies:  Allergies  Allergen Reactions  . Codeine Swelling   Health Maintenance:   There is no immunization history on file for this patient.  Tetanus: last in college Pneumovax: Prevnar 13: Flu vaccine: declines Zostavax:   Colonoscopy: EGD:  Eye Exam: -  Dentist: plans to get  Patient Care Team: Lucky Cowboy, MD as PCP - General (Internal Medicine)  Medical History:  has Diabetes mellitus (HCC); Hypertension; History of CVA (cerebrovascular accident); Morbid obesity (HCC); Environmental allergies; Alcohol consumption binge drinking; Former smoker; Stenosis of right carotid artery; Stroke (HCC); and Diabetes mellitus without complication (HCC) on their problem list. Surgical History:  He  has a past surgical history that includes Tonsillectomy. Family History:  His family history includes Alzheimer's disease in his maternal grandmother and paternal grandmother; Diabetes in his maternal grandmother, mother,  and sister; Emphysema in his paternal grandmother; Hyperlipidemia in his father and paternal grandfather; Hypertension in his father, maternal grandmother, mother, paternal grandfather, and sister; Lymphoma in his mother. Social History:   reports that he quit smoking 6 days ago. His smoking use included cigarettes. He has a 7.50 pack-year smoking history. He has never used smokeless tobacco. He reports that he drinks alcohol. He reports that he does not use drugs. Review of Systems:  Review of Systems  Constitutional: Negative for malaise/fatigue and weight loss.  HENT: Negative for hearing loss and tinnitus.   Eyes: Negative for blurred vision and double vision.  Respiratory: Negative for cough,  shortness of breath and wheezing.   Cardiovascular: Negative for chest pain, palpitations, orthopnea, claudication and leg swelling.  Gastrointestinal: Negative for abdominal pain, blood in stool, constipation, diarrhea, heartburn, melena, nausea and vomiting.  Genitourinary: Negative.   Musculoskeletal: Negative for joint pain and myalgias.  Skin: Negative for rash.  Neurological: Negative for dizziness, tingling, sensory change, weakness and headaches.  Endo/Heme/Allergies: Negative for polydipsia.  Psychiatric/Behavioral: Negative.   All other systems reviewed and are negative.   Physical Exam: Estimated body mass index is 41.59 kg/m as calculated from the following:   Height as of this encounter: 6' 0.25" (1.835 m).   Weight as of this encounter: 308 lb 12.8 oz (140.1 kg). BP 122/82   Pulse 88   Temp (!) 97.5 F (36.4 C)   Ht 6' 0.25" (1.835 m)   Wt (!) 308 lb 12.8 oz (140.1 kg)   SpO2 98%   BMI 41.59 kg/m  General Appearance: Well nourished, in no apparent distress.  Eyes: PERRLA, EOMs, conjunctiva no swelling or erythema, normal fundi and vessels.  Sinuses: No Frontal/maxillary tenderness  ENT/Mouth: Ext aud canals clear, normal light reflex with TMs without erythema, bulging. Good dentition. No erythema, swelling, or exudate on post pharynx. Tonsils not swollen or erythematous. Hearing normal.  Neck: Supple, thyroid normal. No bruits  Respiratory: Respiratory effort normal, BS equal bilaterally without rales, rhonchi, wheezing or stridor.  Cardio: RRR without murmurs, rubs or gallops. Brisk peripheral pulses without edema.  Chest: symmetric, with normal excursions and percussion.  Abdomen: Soft, mildly tender in RUQ, no guarding, rebound, palpable hernias, masses, or organomegaly.  Lymphatics: Non tender without lymphadenopathy.  Genitourinary: Defer Musculoskeletal: Full ROM all peripheral extremities,5/5 strength, and normal gait.  Skin: Warm, dry without rashes,  lesions, ecchymosis. Neuro: Cranial nerves intact, reflexes equal bilaterally. Normal muscle tone, no cerebellar symptoms. Sensation intact.  Psych: Awake and oriented X 3, normal affect, Insight and Judgment appropriate.   EKG: Defer - just had recently in ED  Dan Maker 5:24 PM [facility]

## 2018-07-17 ENCOUNTER — Other Ambulatory Visit: Payer: Self-pay | Admitting: Physician Assistant

## 2018-07-17 DIAGNOSIS — I6521 Occlusion and stenosis of right carotid artery: Secondary | ICD-10-CM | POA: Insufficient documentation

## 2018-07-17 DIAGNOSIS — F1010 Alcohol abuse, uncomplicated: Secondary | ICD-10-CM | POA: Insufficient documentation

## 2018-07-17 DIAGNOSIS — Z87891 Personal history of nicotine dependence: Secondary | ICD-10-CM

## 2018-07-17 HISTORY — DX: Personal history of nicotine dependence: Z87.891

## 2018-07-17 HISTORY — DX: Alcohol abuse, uncomplicated: F10.10

## 2018-07-17 MED ORDER — ONDANSETRON HCL 4 MG PO TABS: 4.0000 mg | ORAL_TABLET | Freq: Every day | ORAL | 1 refills | Status: DC | PRN

## 2018-07-19 ENCOUNTER — Encounter: Payer: Self-pay | Admitting: Physician Assistant

## 2018-07-19 ENCOUNTER — Telehealth: Payer: Self-pay | Admitting: Physician Assistant

## 2018-07-19 DIAGNOSIS — I639 Cerebral infarction, unspecified: Secondary | ICD-10-CM

## 2018-07-19 DIAGNOSIS — E1159 Type 2 diabetes mellitus with other circulatory complications: Secondary | ICD-10-CM | POA: Insufficient documentation

## 2018-07-19 DIAGNOSIS — E119 Type 2 diabetes mellitus without complications: Secondary | ICD-10-CM | POA: Insufficient documentation

## 2018-07-19 NOTE — Telephone Encounter (Signed)
Patient calling with nausea every night at 2 AM for past 2 nights, last night also had tingling on his left side x 15-20 mins with dizziness, dry heaving. Did not go to ER. BP at the time was 140/90 and sugars were 180.   He has not started the augmenting. He has been on ibuprofen every day. He started the lisinopril, lipitor, just started the metformin yesterday.   Told patient to stop iburopfen, start pepcid OTC take 2 at night, states he does snore but wife states he does not stop breathing, fm hx of sleep apnea.  ? GERD/LPR/gallbladder- check at OV Continue 2 pills of metformin and diet ? Need to switch lisinopril  Has follow up neuro the 24th.  He was informed to call 911 if he develop any new symptoms such as worsening headaches, episodes of blurred vision, double vision or complete loss of vision or speech difficulties or motor weakness.

## 2018-07-20 ENCOUNTER — Encounter: Payer: Self-pay | Admitting: Adult Health

## 2018-07-20 ENCOUNTER — Ambulatory Visit: Payer: BLUE CROSS/BLUE SHIELD | Admitting: Adult Health

## 2018-07-20 VITALS — BP 122/82 | HR 88 | Temp 97.5°F | Ht 72.25 in | Wt 308.8 lb

## 2018-07-20 DIAGNOSIS — Z8673 Personal history of transient ischemic attack (TIA), and cerebral infarction without residual deficits: Secondary | ICD-10-CM

## 2018-07-20 DIAGNOSIS — Z79899 Other long term (current) drug therapy: Secondary | ICD-10-CM | POA: Diagnosis not present

## 2018-07-20 DIAGNOSIS — F1010 Alcohol abuse, uncomplicated: Secondary | ICD-10-CM

## 2018-07-20 DIAGNOSIS — R10811 Right upper quadrant abdominal tenderness: Secondary | ICD-10-CM

## 2018-07-20 DIAGNOSIS — E1169 Type 2 diabetes mellitus with other specified complication: Secondary | ICD-10-CM

## 2018-07-20 DIAGNOSIS — I6521 Occlusion and stenosis of right carotid artery: Secondary | ICD-10-CM

## 2018-07-20 DIAGNOSIS — I10 Essential (primary) hypertension: Secondary | ICD-10-CM | POA: Diagnosis not present

## 2018-07-20 DIAGNOSIS — E785 Hyperlipidemia, unspecified: Secondary | ICD-10-CM | POA: Diagnosis not present

## 2018-07-20 DIAGNOSIS — Z87891 Personal history of nicotine dependence: Secondary | ICD-10-CM

## 2018-07-20 DIAGNOSIS — Z9109 Other allergy status, other than to drugs and biological substances: Secondary | ICD-10-CM

## 2018-07-20 DIAGNOSIS — Z0000 Encounter for general adult medical examination without abnormal findings: Secondary | ICD-10-CM

## 2018-07-20 NOTE — Patient Instructions (Addendum)
Jared Ramsey , Thank you for taking time to come in to establish care. I appreciate your ongoing commitment to your health goals. Please review the following plan we discussed and let me know if I can assist you in the future.   These are the goals we discussed: Goals    . Exercise 150 min/wk Moderate Activity    . HEMOGLOBIN A1C < 7.0    . LDL CALC < 70    . Weight (lb) < 275 lb (124.7 kg)       This is a list of the screening recommended for you and due dates:  Health Maintenance  Topic Date Due  . Hemoglobin A1C  09[birth year]08[birth year]  . Pneumococcal vaccine  04/26/1987  . Complete foot exam   04/26/1995  . Eye exam for diabetics  04/26/1995  . HIV Screening  04/25/2000  . Tetanus Vaccine  04/25/2004  . Flu Shot  05/07/2018     Know what a healthy weight is for you (roughly BMI <25) and aim to maintain this  Aim for 7+ servings of fruits and vegetables daily  65-80+ fluid ounces of water or unsweet tea for healthy kidneys  Limit to max 2 drink of alcohol per day; avoid smoking/tobacco  Limit animal fats in diet for cholesterol and heart health - choose grass fed whenever available  Avoid highly processed foods, and foods high in saturated/trans fats  Aim for low stress - take time to unwind and care for your mental health  Aim for 150 min of moderate intensity exercise weekly for heart health, and weights twice weekly for bone health  Aim for 7-9 hours of sleep daily    Drink 1/2 your body weight in fluid ounces of water daily; drink a tall glass of water 30 min before meals  Don't eat until you're stuffed- listen to your stomach and eat until you are 80% full   Try eating off of a salad plate; wait 10 min after finishing before going back for seconds  Start by eating the vegetables on your plate; aim for 40% of your meals to be fruits or vegetables  Then eat your protein - lean meats (grass fed if possible), fish, beans, nuts in moderation  Eat your carbs/starch last  ONLY if you still are hungry. If you can, stop before finishing it all  Avoid sugar and flour - the closer it looks to it's original form in nature, typically the better it is for you  Splurge in moderation - "assign" days when you get to splurge and have the "bad stuff" - I like to follow a 80% - 20% plan- "good" choices 80 % of the time, "bad" choices in moderation 20% of the time  Simple equation is: Calories out > calories in = weight loss - even if you eat the bad stuff, if you limit portions, you will still lose weight       When it comes to diets, agreement about the perfect plan isn't easy to find, even among the experts. Experts at the [facility] [employer] developed an idea known as the Healthy Eating Plate. Just imagine a plate divided into logical, healthy portions.  The emphasis is on diet quality:  Load up on vegetables and fruits - one-half of your plate: Aim for color and variety, and remember that potatoes don't count.  Go for whole grains - one-quarter of your plate: Whole wheat, barley, wheat berries, quinoa, oats, brown rice, and foods made with them.  If you want pasta, go with whole wheat pasta.  Protein power - one-quarter of your plate: Fish, chicken, beans, and nuts are all healthy, versatile protein sources. Limit red meat.  The diet, however, does go beyond the plate, offering a few other suggestions.  Use healthy plant oils, such as olive, canola, soy, corn, sunflower and peanut. Check the labels, and avoid partially hydrogenated oil, which have unhealthy trans fats.  If you're thirsty, drink water. Coffee and tea are good in moderation, but skip sugary drinks and limit milk and dairy products to one or two daily servings.  The type of carbohydrate in the diet is more important than the amount. Some sources of carbohydrates, such as vegetables, fruits, whole grains, and beans-are healthier than others.  Finally, stay active.

## 2018-07-21 ENCOUNTER — Other Ambulatory Visit: Payer: Self-pay | Admitting: Adult Health

## 2018-07-21 LAB — COMPLETE METABOLIC PANEL WITH GFR
AG Ratio: 1.8 (calc) (ref 1.0–2.5)
ALKALINE PHOSPHATASE (APISO): 53 U/L (ref 40–115)
ALT: 67 U/L — ABNORMAL HIGH (ref 9–46)
AST: 23 U/L (ref 10–40)
Albumin: 5.1 g/dL (ref 3.6–5.1)
BILIRUBIN TOTAL: 1.5 mg/dL — AB (ref 0.2–1.2)
BUN: 13 mg/dL (ref 7–25)
CHLORIDE: 100 mmol/L (ref 98–110)
CO2: 27 mmol/L (ref 20–32)
CREATININE: 0.91 mg/dL (ref 0.60–1.35)
Calcium: 10.6 mg/dL — ABNORMAL HIGH (ref 8.6–10.3)
GFR, Est African American: 128 mL/min/{1.73_m2} (ref >=60)
GFR, Est Non African American: 110 mL/min/{1.73_m2} (ref >=60)
GLUCOSE: 132 mg/dL — AB (ref 65–99)
Globulin: 2.8 g/dL (calc) (ref 1.9–3.7)
Potassium: 4.4 mmol/L (ref 3.5–5.3)
Sodium: 137 mmol/L (ref 135–146)
Total Protein: 7.9 g/dL (ref 6.1–8.1)

## 2018-07-21 LAB — LIPID PANEL
Cholesterol: 242 mg/dL — ABNORMAL HIGH (ref <200)
HDL: 37 mg/dL — ABNORMAL LOW (ref >40)
LDL Cholesterol (Calc): 179 mg/dL (calc) — ABNORMAL HIGH
NON-HDL CHOLESTEROL (CALC): 205 mg/dL — AB (ref <130)
Total CHOL/HDL Ratio: 6.5 (calc) — ABNORMAL HIGH (ref <5.0)
Triglycerides: 127 mg/dL (ref <150)

## 2018-07-21 LAB — CBC WITH DIFFERENTIAL/PLATELET
BASOS PCT: 0.5 %
Basophils Absolute: 56 cells/uL (ref 0–200)
EOS ABS: 311 {cells}/uL (ref 15–500)
Eosinophils Relative: 2.8 %
HEMATOCRIT: 46.6 % (ref 38.5–50.0)
HEMOGLOBIN: 15.9 g/dL (ref 13.2–17.1)
LYMPHS ABS: 3297 {cells}/uL (ref 850–3900)
MCH: 29 pg (ref 27.0–33.0)
MCHC: 34.1 g/dL (ref 32.0–36.0)
MCV: 84.9 fL (ref 80.0–100.0)
MPV: 10.1 fL (ref 7.5–12.5)
Monocytes Relative: 7.5 %
NEUTROS ABS: 6605 {cells}/uL (ref 1500–7800)
Neutrophils Relative %: 59.5 %
Platelets: 339 10*3/uL (ref 140–400)
RBC: 5.49 10*6/uL (ref 4.20–5.80)
RDW: 12.2 % (ref 11.0–15.0)
Total Lymphocyte: 29.7 %
WBC: 11.1 10*3/uL — AB (ref 3.8–10.8)
WBCMIX: 833 {cells}/uL (ref 200–950)

## 2018-07-21 LAB — URINALYSIS, ROUTINE W REFLEX MICROSCOPIC
BILIRUBIN URINE: NEGATIVE
GLUCOSE, UA: NEGATIVE
Hgb urine dipstick: NEGATIVE
Ketones, ur: NEGATIVE
Leukocytes, UA: NEGATIVE
Nitrite: NEGATIVE
PROTEIN: NEGATIVE
Specific Gravity, Urine: 1.01 (ref 1.001–1.03)

## 2018-07-21 LAB — HEMOGLOBIN A1C
HEMOGLOBIN A1C: 10.7 %{Hb} — AB (ref <5.7)
MEAN PLASMA GLUCOSE: 260 (calc)
eAG (mmol/L): 14.4 (calc)

## 2018-07-21 LAB — MAGNESIUM: MAGNESIUM: 1.9 mg/dL (ref 1.5–2.5)

## 2018-07-21 LAB — MICROALBUMIN / CREATININE URINE RATIO
Creatinine, Urine: 64 mg/dL (ref 20–320)
Microalb Creat Ratio: 19 mcg/mg creat (ref <30)
Microalb, Ur: 1.2 mg/dL

## 2018-07-21 LAB — TSH: TSH: 1.92 m[IU]/L (ref 0.40–4.50)

## 2018-07-21 MED ORDER — METFORMIN HCL 500 MG PO TABS: ORAL_TABLET | ORAL | 1 refills | Status: DC

## 2018-07-30 ENCOUNTER — Other Ambulatory Visit: Payer: Self-pay

## 2018-07-30 ENCOUNTER — Encounter: Payer: Self-pay | Admitting: Neurology

## 2018-07-30 ENCOUNTER — Ambulatory Visit: Payer: BLUE CROSS/BLUE SHIELD | Admitting: Neurology

## 2018-07-30 VITALS — BP 128/79 | HR 72 | Ht 73.0 in | Wt 303.0 lb

## 2018-07-30 DIAGNOSIS — I67841 Reversible cerebrovascular vasoconstriction syndrome: Secondary | ICD-10-CM | POA: Diagnosis not present

## 2018-07-30 DIAGNOSIS — G459 Transient cerebral ischemic attack, unspecified: Secondary | ICD-10-CM | POA: Diagnosis not present

## 2018-07-30 NOTE — Patient Instructions (Signed)
I had a long discussion with the patient with regards to his recent episode of left hemispheric TIA as well as previous episode of likely right cerebellar infarct due to cerebral reversible vasoconstriction syndrome.  Both episodes appear to be triggered by using phentermine and Sudafed and I advised him to stay away from these.  Continue aspirin for stroke prevention and aggressive risk factor modification with strict control of hypertension with blood pressure goal below 130/90, lipids with LDL cholesterol goal below 70 mg percent and diabetes with hemoglobin A1c goal below 6.5%.  I encouraged him to eat a healthy diet with lots of fruits, vegetables, cereals and whole grains and to be active and to lose weight.  Check polysomnogram for sleep apnea and transcranial Doppler bubble study for PFO.  Check lipid profile, hemoglobin A1c, anticardiolipin antibodies and ANA.  He will return for follow-up in 3 months or call earlier if necessary.  Stroke Prevention Some medical conditions and behaviors are associated with a higher chance of having a stroke. You can help prevent a stroke by making nutrition, lifestyle, and other changes, including managing any medical conditions you may have. What nutrition changes can be made?  Eat healthy foods. You can do this by: ? Choosing foods high in fiber, such as fresh fruits and vegetables and whole grains. ? Eating at least 5 or more servings of fruits and vegetables a day. Try to fill half of your plate at each meal with fruits and vegetables. ? Choosing lean protein foods, such as lean cuts of meat, poultry without skin, fish, tofu, beans, and nuts. ? Eating low-fat dairy products. ? Avoiding foods that are high in salt (sodium). This can help lower blood pressure. ? Avoiding foods that have saturated fat, trans fat, and cholesterol. This can help prevent high cholesterol. ? Avoiding processed and premade foods.  Follow your health care provider's specific  guidelines for losing weight, controlling high blood pressure (hypertension), lowering high cholesterol, and managing diabetes. These may include: ? Reducing your daily calorie intake. ? Limiting your daily sodium intake to 1,500 milligrams (mg). ? Using only healthy fats for cooking, such as olive oil, canola oil, or sunflower oil. ? Counting your daily carbohydrate intake. What lifestyle changes can be made?  Maintain a healthy weight. Talk to your health care provider about your ideal weight.  Get at least 30 minutes of moderate physical activity at least 5 days a week. Moderate activity includes brisk walking, biking, and swimming.  Do not use any products that contain nicotine or tobacco, such as cigarettes and e-cigarettes. If you need help quitting, ask your health care provider. It may also be helpful to avoid exposure to secondhand smoke.  Limit alcohol intake to no more than 1 drink a day for nonpregnant women and 2 drinks a day for men. One drink equals 12 oz of beer, 5 oz of wine, or 1 oz of hard liquor.  Stop any illegal drug use.  Avoid taking birth control pills. Talk to your health care provider about the risks of taking birth control pills if: ? You are over 39 years old. ? You smoke. ? You get migraines. ? You have ever had a blood clot. What other changes can be made?  Manage your cholesterol levels. ? Eating a healthy diet is important for preventing high cholesterol. If cholesterol cannot be managed through diet alone, you may also need to take medicines. ? Take any prescribed medicines to control your cholesterol as told by your  health care provider.  Manage your diabetes. ? Eating a healthy diet and exercising regularly are important parts of managing your blood sugar. If your blood sugar cannot be managed through diet and exercise, you may need to take medicines. ? Take any prescribed medicines to control your diabetes as told by your health care  provider.  Control your hypertension. ? To reduce your risk of stroke, try to keep your blood pressure below 130/80. ? Eating a healthy diet and exercising regularly are an important part of controlling your blood pressure. If your blood pressure cannot be managed through diet and exercise, you may need to take medicines. ? Take any prescribed medicines to control hypertension as told by your health care provider. ? Ask your health care provider if you should monitor your blood pressure at home. ? Have your blood pressure checked every year, even if your blood pressure is normal. Blood pressure increases with age and some medical conditions.  Get evaluated for sleep disorders (sleep apnea). Talk to your health care provider about getting a sleep evaluation if you snore a lot or have excessive sleepiness.  Take over-the-counter and prescription medicines only as told by your health care provider. Aspirin or blood thinners (antiplatelets or anticoagulants) may be recommended to reduce your risk of forming blood clots that can lead to stroke.  Make sure that any other medical conditions you have, such as atrial fibrillation or atherosclerosis, are managed. What are the warning signs of a stroke? The warning signs of a stroke can be easily remembered as BEFAST.  B is for balance. Signs include: ? Dizziness. ? Loss of balance or coordination. ? Sudden trouble walking.  E is for eyes. Signs include: ? A sudden change in vision. ? Trouble seeing.  F is for face. Signs include: ? Sudden weakness or numbness of the face. ? The face or eyelid drooping to one side.  A is for arms. Signs include: ? Sudden weakness or numbness of the arm, usually on one side of the body.  S is for speech. Signs include: ? Trouble speaking (aphasia). ? Trouble understanding.  T is for time. ? These symptoms may represent a serious problem that is an emergency. Do not wait to see if the symptoms will go away.  Get medical help right away. Call your local emergency services (911 in the [place] Do not drive yourself to the hospital.  Other signs of stroke may include: ? A sudden, severe headache with no known cause. ? Nausea or vomiting. ? Seizure.  Where to find more information: For more information, visit:  American Stroke Association: www.strokeassociation.org  National Stroke Association: www.stroke.org  Summary  You can prevent a stroke by eating healthy, exercising, not smoking, limiting alcohol intake, and managing any medical conditions you may have.  Do not use any products that contain nicotine or tobacco, such as cigarettes and e-cigarettes. If you need help quitting, ask your health care provider. It may also be helpful to avoid exposure to secondhand smoke.  Remember BEFAST for warning signs of stroke. Get help right away if you or a loved one has any of these signs. This information is not intended to replace advice given to you by your health care provider. Make sure you discuss any questions you have with your health care provider. Document Released: 10/31/2004 Document Revised: 10/29/2016 Document Reviewed: 10/29/2016 Elsevier Interactive Patient Education  [employer].

## 2018-07-30 NOTE — Progress Notes (Signed)
[facility] [address] [place]. [place] [address] [phone]       OFFICE CONSULT NOTE  Jared. Jared Ramsey Date of Birth:  01-09-1986 Medical Record Number:  [id number]   Referring MD: Judd Gaudier, NP Reason for Referral: TIA  HPI:  Jared Ramsey is a 32 year old Caucasian male who is seen today for initial consultation visit for TIA and stroke.  History is obtained from the patient and review of electronic medical records.  I personally reviewed imaging films.  The patient states that on 07/14/2018 he developed sudden onset of nausea, dry heaving as well as some swallowing difficulties and noticed heaviness of the right face arm and leg after picking up his daughter from school.Marland Kitchen  He describes a mild 3/10 pressure type headache over the right eye in the right temporal region as well as some tightness in the right side of the neck.  Symptoms lasted about 4 hours and then resolved completely.  The day prior to he was actually seen in the emergency room and was found to have significantly elevated blood pressure greater than 190 systolic.  The patient had been suffering from an cold and had been taking Sudafed recently prior to the present episode..  The patient's blood pressure was 190 systolic when he went to urgent care on 07/13/2018 but today in the office it is 128/79.  Interestingly the patient initially developed postcoital headaches on several occasions in October 2017.  He was eventually seen at [facility] neurology clinic and an MRI scan of the brain was obtained on 08/13/2016 which showed a subacute right superior cerebellar infarct.  MRA of the brain showed irregularity of the proximal right middle cerebral artery.  Patient was on phentermine for weight loss and this was felt to be cerebral reversible vasoconstriction syndrome.  This was discontinued.  He had a second MRI on 09/23/2016 which showed the right cerebellar infarct.  Interestingly on the CT angiogram that he had  recently on 07/14/2018 the right middle cerebral artery irregularities no longer seen.  He was found to have elevated LDL cholesterol of 172 mg percent and hemoglobin A1c of 10.9 on 07/14/2018.  Patient does complain of snoring, restless sleep and daytime sleepiness.  He has not been evaluated for sleep apnea.He is tolerating aspirin well without bruising or bleeding.  He remains on Lipitor 40 mg which was recently started but follow-up lipid profile has not yet been checked.  He states his sugars are well controlled even though the last A1c was quite high ROS:   14 system review of systems is positive for numbness, snoring, daytime tiredness and all other systems negative  PMH:  Past Medical History:  Diagnosis Date  . Diabetes mellitus without complication (HCC)   . Hypertension   . Morbid obesity (HCC)   . Stroke [facility])    "mini stroke"    Social History:  Social History   Socioeconomic History  . Marital status: Married    Spouse name: Not on file  . Number of children: 1  . Years of education: Not on file  . Highest education level: Not on file  Occupational History  . Not on file  Social Needs  . Financial resource strain: Not on file  . Food insecurity:    Worry: Not on file    Inability: Not on file  . Transportation needs:    Medical: Not on file    Non-medical: Not on file  Tobacco Use  . Smoking status: Former Smoker  Packs/day: 0.50    Years: 15.00    Pack years: 7.50    Types: Cigarettes    Last attempt to quit: 07/14/2018    Years since quitting: 0.0  . Smokeless tobacco: Never Used  Substance and Sexual Activity  . Alcohol use: Yes    Comment: 6 pack over the weekend  . Drug use: Never  . Sexual activity: Yes    Partners: Female    Birth control/protection: Implant  Lifestyle  . Physical activity:    Days per week: 0 days    Minutes per session: 0 min  . Stress: Only a little  Relationships  . Social connections:    Talks on phone: Not on file     Gets together: Not on file    Attends religious service: Not on file    Active member of club or organization: Not on file    Attends meetings of clubs or organizations: Not on file    Relationship status: Not on file  . Intimate partner violence:    Fear of current or ex partner: Not on file    Emotionally abused: Not on file    Physically abused: Not on file    Forced sexual activity: Not on file  Other Topics Concern  . Not on file  Social History Narrative  . Not on file    Medications:   Current Outpatient Medications on File Prior to Visit  Medication Sig Dispense Refill  . aspirin 81 MG chewable tablet Chew 81 mg by mouth daily.     Marland Kitchen atorvastatin (LIPITOR) 40 MG tablet Take 1 tablet (40 mg total) by mouth daily. 30 tablet 1  . famotidine (PEPCID) 10 MG tablet Take 10 mg by mouth as needed.     Marland Kitchen lisinopril (PRINIVIL,ZESTRIL) 10 MG tablet Take 1 tablet (10 mg total) by mouth daily. 30 tablet 0  . loratadine (CLARITIN) 10 MG tablet Take 10 mg by mouth daily as needed.     . metFORMIN (GLUCOPHAGE) 500 MG tablet Take 2 tabs with largest meal of the day and 1 tab with each other meal for a total of 4 tabs daily. 360 tablet 1   No current facility-administered medications on file prior to visit.     Allergies:   Allergies  Allergen Reactions  . Codeine Swelling    Physical Exam General: obese young Caucasian male, seated, in no evident distress Head: head normocephalic and atraumatic.   Neck: supple with no carotid or supraclavicular bruits Cardiovascular: regular rate and rhythm, no murmurs Musculoskeletal: no deformity Skin:  no rash/petichiae Vascular:  Normal pulses all extremities  Neurologic Exam Mental Status: Awake and fully alert. Oriented to place and time. Recent and remote memory intact. Attention span, concentration and fund of knowledge appropriate. Mood and affect appropriate.  Cranial Nerves: Fundoscopic exam reveals sharp disc margins. Pupils equal,  briskly reactive to light. Extraocular movements full without nystagmus. Visual fields full to confrontation. Hearing intact. Facial sensation intact. Face, tongue, palate moves normally and symmetrically.  Motor: Normal bulk and tone. Normal strength in all tested extremity muscles. Sensory.: intact to touch , pinprick , position and vibratory sensation.  Coordination: Rapid alternating movements normal in all extremities. Finger-to-nose and heel-to-shin performed accurately bilaterally. Gait and Station: Arises from chair without difficulty. Stance is normal. Gait demonstrates normal stride length and balance . Able to heel, toe and tandem walk without difficulty.  Reflexes: 1+ and symmetric. Toes downgoing.   NIHSS  0 Modified Rankin  0   ASSESSMENT: 15 year Caucasian male with episode of left hemispheric TIA in October 2019 likely from vasoconstriction from Sudafed use.  Prior history of likely right cerebellar infarct in 2017 secondary to cerebral reversible vasoconstriction syndrome while using phentermine and postcoital headaches.  Both episodes apparently happened in the setting of using vasoactive medications.  Multiple vascular risk factors of obesity, diabetes, hypertension, hyperlipidemia and suspected sleep apnea    PLAN: I had a long discussion with the patient with regards to his recent episode of left hemispheric TIA as well as previous episode of likely right cerebellar infarct due to cerebral reversible vasoconstriction syndrome.  Both episodes appear to be triggered by using phentermine and Sudafed and I advised him to stay away from these.  Continue aspirin for stroke prevention and aggressive risk factor modification with strict control of hypertension with blood pressure goal below 130/90, lipids with LDL cholesterol goal below 70 mg percent and diabetes with hemoglobin A1c goal below 6.5%.  I encouraged him to eat a healthy diet with lots of fruits, vegetables, cereals and  whole grains and to be active and to lose weight.  Check polysomnogram for sleep apnea and transcranial Doppler bubble study for PFO.  Check lipid profile, hemoglobin A1c, anticardiolipin antibodies and ANA. . I have advised him in the future to stay away from vasoactive medications like cough and cold medicines and migraine medications like triptan's.  Greater than 50% time during this 45-minute consultation visit was spent on counseling and coordination of care about stroke, TIA, reversible cerebral vasoconstriction syndrome and answering questions He will return for follow-up in 3 months or call earlier if necessary. Delia Heady, MD  [facility] [address] [place], [place] [address]  Phone [phone] Fax [phone] Note: This document was prepared with digital dictation and possible smart phrase technology. Any transcriptional errors that result from this process are unintentional.

## 2018-08-02 LAB — LIPID PANEL
CHOLESTEROL TOTAL: 156 mg/dL (ref 100–199)
Chol/HDL Ratio: 4.2 ratio (ref 0.0–5.0)
HDL: 37 mg/dL — ABNORMAL LOW (ref >39)
LDL Calculated: 97 mg/dL (ref 0–99)
Triglycerides: 108 mg/dL (ref 0–149)
VLDL Cholesterol Cal: 22 mg/dL (ref 5–40)

## 2018-08-02 LAB — ANA,IFA RA DIAG PNL W/RFLX TIT/PATN
ANA Titer 1: NEGATIVE
Cyclic Citrullin Peptide Ab: 9 units (ref 0–19)
Rheumatoid fact SerPl-aCnc: <10 IU/mL (ref 0.0–13.9)

## 2018-08-02 LAB — CARDIOLIPIN ANTIBODY, IGA

## 2018-08-06 ENCOUNTER — Ambulatory Visit (INDEPENDENT_AMBULATORY_CARE_PROVIDER_SITE_OTHER): Payer: BLUE CROSS/BLUE SHIELD | Admitting: Neurology

## 2018-08-06 ENCOUNTER — Encounter: Payer: Self-pay | Admitting: Neurology

## 2018-08-06 VITALS — BP 155/96 | HR 99 | Ht 73.0 in | Wt 301.0 lb

## 2018-08-06 DIAGNOSIS — R0683 Snoring: Secondary | ICD-10-CM | POA: Diagnosis not present

## 2018-08-06 DIAGNOSIS — G4719 Other hypersomnia: Secondary | ICD-10-CM

## 2018-08-06 DIAGNOSIS — Z820 Family history of epilepsy and other diseases of the nervous system: Secondary | ICD-10-CM

## 2018-08-06 DIAGNOSIS — E669 Obesity, unspecified: Secondary | ICD-10-CM | POA: Diagnosis not present

## 2018-08-06 DIAGNOSIS — I639 Cerebral infarction, unspecified: Secondary | ICD-10-CM

## 2018-08-06 DIAGNOSIS — Z8673 Personal history of transient ischemic attack (TIA), and cerebral infarction without residual deficits: Secondary | ICD-10-CM

## 2018-08-06 NOTE — Patient Instructions (Addendum)

## 2018-08-06 NOTE — Progress Notes (Signed)
Subjective:    Patient ID: Jared Ramsey is a 32 y.o. male.  HPI     Jared Foley, MD, PhD [facility] [address] [address] [place], [place] [address]  Dear Janalyn Shy,   I saw your patient, Jared Ramsey, upon your kind request, in my sleep clinic today for initial consultation of his sleep disorder, in particular, concern for underlying obstructive sleep apnea. The patient is unaccompanied today. As you know, Jared Ramsey is a 32 year old right-handed gentleman with an underlying medical history of TIA, cerebellar stroke, hypertension, diabetes, and morbid obesity, who reports snoring and excessive daytime somnolence. I reviewed your office note from 07/30/2018. His Epworth sleepiness score is 7 out of 24, fatigue score is 19 out of 63. He is married and lives with his wife and 36 yo daughter, quit smoking about 3 weeks ago, drinks alcohol in the form of beer, 2 per week on average, drinks quite a bit of caffeine in the form of coffee, one "pot a day". He does not drink sodas.  He has been a [employer] architect, has a CDL. He is trying to lose weight. His mother had sleep apnea and improved after significant weight loss in the context of weight loss surgery. He would be willing to get tested for sleep apnea and consider CPAP therapy. He does snore, particularly when he sleeps on his back. He typically likes to sleep on his stomach. He had a tonsillectomy and adenoidectomy as a child. His A1c was high around 9, he is working on diabetes management.   His Past Medical History Is Significant For: Past Medical History:  Diagnosis Date  . Diabetes mellitus without complication (HCC)   . Hypertension   . Morbid obesity (HCC)   . Stroke [facility])    "mini stroke"    His Past Surgical History Is Significant For: Past Surgical History:  Procedure Laterality Date  . TONSILLECTOMY      His Family History Is Significant For: Family History  Problem Relation Age of Onset   . Diabetes Mother        resolved post gastric bypass  . Lymphoma Mother   . Hypertension Mother   . Hyperlipidemia Father   . Hypertension Father   . Diabetes Sister   . Hypertension Sister   . Alzheimer's disease Maternal Grandmother   . Diabetes Maternal Grandmother   . Hypertension Maternal Grandmother   . Alzheimer's disease Paternal Grandmother   . Emphysema Paternal Grandmother        smoker  . Hypertension Paternal Grandfather   . Hyperlipidemia Paternal Grandfather     His Social History Is Significant For: Social History   Socioeconomic History  . Marital status: Married    Spouse name: Not on file  . Number of children: 1  . Years of education: Not on file  . Highest education level: Not on file  Occupational History  . Not on file  Social Needs  . Financial resource strain: Not on file  . Food insecurity:    Worry: Not on file    Inability: Not on file  . Transportation needs:    Medical: Not on file    Non-medical: Not on file  Tobacco Use  . Smoking status: Former Smoker    Packs/day: 0.50    Years: 15.00    Pack years: 7.50    Types: Cigarettes    Last attempt to quit: 07/14/2018    Years since quitting: 0.0  . Smokeless tobacco:  Never Used  Substance and Sexual Activity  . Alcohol use: Yes    Comment: 6 pack over the weekend  . Drug use: Never  . Sexual activity: Yes    Partners: Female    Birth control/protection: Implant  Lifestyle  . Physical activity:    Days per week: 0 days    Minutes per session: 0 min  . Stress: Only a little  Relationships  . Social connections:    Talks on phone: Not on file    Gets together: Not on file    Attends religious service: Not on file    Active member of club or organization: Not on file    Attends meetings of clubs or organizations: Not on file    Relationship status: Not on file  Other Topics Concern  . Not on file  Social History Narrative  . Not on file    His Allergies Are:  Allergies   Allergen Reactions  . Codeine Swelling  :   His Current Medications Are:  Outpatient Encounter Medications as of 08/06/2018  Medication Sig  . aspirin 81 MG chewable tablet Chew 81 mg by mouth daily.   Marland Kitchen atorvastatin (LIPITOR) 40 MG tablet Take 1 tablet (40 mg total) by mouth daily.  . famotidine (PEPCID) 10 MG tablet Take 10 mg by mouth as needed.   Marland Kitchen lisinopril (PRINIVIL,ZESTRIL) 10 MG tablet Take 1 tablet (10 mg total) by mouth daily.  Marland Kitchen loratadine (CLARITIN) 10 MG tablet Take 10 mg by mouth daily as needed.   . metFORMIN (GLUCOPHAGE) 500 MG tablet Take 2 tabs with largest meal of the day and 1 tab with each other meal for a total of 4 tabs daily.   No facility-administered encounter medications on file as of 08/06/2018.   :  Review of Systems:  Out of a complete 14 point review of systems, all are reviewed and negative with the exception of these symptoms as listed below:  Review of Systems  Neurological:       Pt presents today to discuss his sleep. Pt has never had a sleep study but does endorse snoring.  Epworth Sleepiness Scale 0= would never doze 1= slight chance of dozing 2= moderate chance of dozing 3= high chance of dozing  Sitting and reading: 1 Watching TV: 2 Sitting inactive in a public place (ex. Theater or meeting): 0 As a passenger in a car for an hour without a break: 0 Lying down to rest in the afternoon: 3 Sitting and talking to someone: 0 Sitting quietly after lunch (no alcohol): 1 In a car, while stopped in traffic: 0 Total: 7     Objective:  Neurological Exam  Physical Exam Physical Examination:   Vitals:   08/06/18 1523  BP: (!) 155/96  Pulse: 99    General Examination: The patient is a very pleasant 33 y.o. male in no acute distress. He appears well-developed and well-nourished and well groomed.   HEENT: Normocephalic, atraumatic, pupils are equal, round and reactive to light and accommodation. Extraocular tracking is good without  limitation to gaze excursion or nystagmus noted. Normal smooth pursuit is noted. Hearing is grossly intact. Face is symmetric with normal facial animation and normal facial sensation. Speech is clear with no dysarthria noted. There is no hypophonia. There is no lip, neck/head, jaw or voice tremor. Neck is supple with full range of passive and active motion. There are no carotid bruits on auscultation. Oropharynx exam reveals: moderate mouth dryness, adequate dental hygiene and  moderate airway crowding, due to smaller airway entry, and larger tongue. Mallampati is class III. Tongue protrudes centrally and palate elevates symmetrically. Tonsils are absent. Neck size is 18.5 inches. He has a Mild overbite.  Chest: Clear to auscultation without wheezing, rhonchi or crackles noted.  Heart: S1+S2+0, regular and normal without murmurs, rubs or gallops noted.   Abdomen: Soft, non-tender and non-distended with normal bowel sounds appreciated on auscultation.  Extremities: There is no pitting edema in the distal lower extremities bilaterally. Pedal pulses are intact.  Skin: Warm and dry without trophic changes noted.   Musculoskeletal: exam reveals no obvious joint deformities, tenderness or joint swelling or erythema.   Neurologically:  Mental status: The patient is awake, alert and oriented in all 4 spheres. His immediate and remote memory, attention, language skills and fund of knowledge are appropriate. There is no evidence of aphasia, agnosia, apraxia or anomia. Speech is clear with normal prosody and enunciation. Thought process is linear. Mood is normal and affect is normal.  Cranial nerves II - XII are as described above under HEENT exam. In addition: shoulder shrug is normal with equal shoulder height noted. Motor exam: Normal bulk, strength and tone is noted. There is no drift, tremor or rebound. Romberg is negative. Reflexes are 2+ throughout. Fine motor skills and coordination: intact with normal  finger taps, normal hand movements, normal rapid alternating patting, normal foot taps and normal foot agility.  Cerebellar testing: No dysmetria or intention tremor on finger to nose testing. Heel to shin is unremarkable bilaterally. There is no truncal or gait ataxia.  Sensory exam: intact to light touch.  Gait, station and balance: He stands easily. No veering to one side is noted. No leaning to one side is noted. Posture is age-appropriate and stance is narrow based. Gait shows normal stride length and normal pace. No problems turning are noted. Tandem walk is okay.   Assessment and plan:   In summary, Tanush Drees is a very pleasant 32 y.o.-year old male with an underlying medical history of TIA, cerebellar stroke, hypertension, diabetes, and morbid obesity, whose history and physical exam are concerning for obstructive sleep apnea (OSA). I had a long chat with the patient about my findings and the diagnosis of OSA, its prognosis and treatment options. We talked about medical treatments, surgical interventions and non-pharmacological approaches. I explained in particular the risks and ramifications of untreated moderate to severe OSA, especially with respect to developing cardiovascular disease down the Road, including congestive heart failure, difficult to treat hypertension, cardiac arrhythmias, or stroke. Even type 2 diabetes has, in part, been linked to untreated OSA. Symptoms of untreated OSA include daytime sleepiness, memory problems, mood irritability and mood disorder such as depression and anxiety, lack of energy, as well as recurrent headaches, especially morning headaches. We talked about ongoing smoking cessation and trying to maintain a healthy lifestyle in general, as well as the importance of weight control. I encouraged the patient to eat healthy, exercise daily and keep well hydrated, to keep a scheduled bedtime and wake time routine, to not skip any meals and eat healthy snacks in  between meals. I advised the patient not to drive when feeling sleepy. I recommended the following at this time: sleep study with potential positive airway pressure titration. (We will score hypopneas at 3%).   I explained the sleep test procedure to the patient and also outlined possible surgical and non-surgical treatment options of OSA, including the use of a custom-made dental device (which would  require a referral to a specialist dentist or oral surgeon), upper airway surgical options, such as pillar implants, radiofrequency surgery, tongue base surgery, and UPPP (which would involve a referral to an ENT surgeon). Rarely, jaw surgery such as mandibular advancement may be considered.  I also explained the CPAP treatment option to the patient, who indicated that he would be willing to try CPAP if the need arises. I explained the importance of being compliant with PAP treatment, not only for insurance purposes but primarily to improve His symptoms, and for the patient's long term health benefit, including to reduce His cardiovascular risks. I answered all his questions today and the patient was in agreement. I plan to see him back after the sleep study is completed and encouraged him to call with any interim questions, concerns, problems or updates.   Thank you very much for allowing me to participate in the care of this nice patient. If I can be of any further assistance to you please do not hesitate to call me at [phone].  Sincerely,   Jared Foley, MD, PhD

## 2018-08-11 ENCOUNTER — Other Ambulatory Visit: Payer: Self-pay | Admitting: Adult Health

## 2018-08-11 MED ORDER — LISINOPRIL 10 MG PO TABS: 10.0000 mg | ORAL_TABLET | Freq: Every day | ORAL | 1 refills | Status: DC

## 2018-08-11 MED ORDER — ATORVASTATIN CALCIUM 40 MG PO TABS: 40.0000 mg | ORAL_TABLET | Freq: Every day | ORAL | 1 refills | Status: DC

## 2018-08-12 ENCOUNTER — Telehealth: Payer: Self-pay

## 2018-08-12 ENCOUNTER — Other Ambulatory Visit: Payer: Self-pay | Admitting: Adult Health

## 2018-08-12 MED ORDER — ROSUVASTATIN CALCIUM 40 MG PO TABS: 40.0000 mg | ORAL_TABLET | Freq: Every day | ORAL | 2 refills | Status: DC

## 2018-08-12 NOTE — Telephone Encounter (Signed)
RN call patient about his lab work for vasculitis and abnormal clotting tendency. Rn stated his bad cholesterol is unsatisfactory and needs to be increase to 80 lipitor. PT stated he has seen his PCP, the NP put him on 40 mg of crestor which he will be taking tomorrow. RN stated Dr .Pearlean Brownie will be notified and pt verbalized understanding.

## 2018-08-12 NOTE — Telephone Encounter (Signed)
-----   Message from Micki Riley, MD sent at 08/11/2018  8:32 AM EST ----- Kindly inform patient that all lab work for vasculitis and abnormal clotting tendency is fine but bad cholestrol is unsatisfactory and need to increase lipitor dose to 80 mg daily and to see Dr Oneta Rack for this

## 2018-08-25 ENCOUNTER — Ambulatory Visit (HOSPITAL_COMMUNITY)
Admission: RE | Admit: 2018-08-25 | Discharge: 2018-08-25 | Disposition: A | Discharge status: Home / Self Care | Payer: BLUE CROSS/BLUE SHIELD | Source: Ambulatory Visit | Attending: Neurology | Admitting: Neurology

## 2018-08-25 ENCOUNTER — Other Ambulatory Visit (HOSPITAL_COMMUNITY): Payer: BLUE CROSS/BLUE SHIELD

## 2018-08-25 ENCOUNTER — Encounter (HOSPITAL_COMMUNITY): Payer: Self-pay | Admitting: Neurology

## 2018-08-25 DIAGNOSIS — G459 Transient cerebral ischemic attack, unspecified: Secondary | ICD-10-CM | POA: Diagnosis not present

## 2018-08-25 NOTE — Progress Notes (Signed)
TCD bubble study completed with Dr. Roda ShuttersXu.   No HITS at rest or during Valsalva. No apparent PFO.     Farrel DemarkJill Eunice, RDMS, RVT Blanch MediaMegan Riddle, RVS

## 2018-08-26 ENCOUNTER — Telehealth: Payer: Self-pay

## 2018-08-26 NOTE — Telephone Encounter (Signed)
-----   Message from Micki RileyPramod S Sethi, MD sent at 08/26/2018  8:08 AM EST ----- Kindly inform patient that TCD Bubble study was negative

## 2018-08-26 NOTE — Telephone Encounter (Signed)
Rn call patient that the TCD bubble study was negative. Pt verbalized understanding. ------

## 2018-08-27 ENCOUNTER — Ambulatory Visit (INDEPENDENT_AMBULATORY_CARE_PROVIDER_SITE_OTHER): Payer: BLUE CROSS/BLUE SHIELD | Admitting: Neurology

## 2018-08-27 DIAGNOSIS — G4719 Other hypersomnia: Secondary | ICD-10-CM

## 2018-08-27 DIAGNOSIS — Z820 Family history of epilepsy and other diseases of the nervous system: Secondary | ICD-10-CM

## 2018-08-27 DIAGNOSIS — E669 Obesity, unspecified: Secondary | ICD-10-CM

## 2018-08-27 DIAGNOSIS — G4720 Circadian rhythm sleep disorder, unspecified type: Secondary | ICD-10-CM

## 2018-08-27 DIAGNOSIS — I639 Cerebral infarction, unspecified: Secondary | ICD-10-CM

## 2018-08-27 DIAGNOSIS — G4710 Hypersomnia, unspecified: Secondary | ICD-10-CM

## 2018-08-27 DIAGNOSIS — Z8673 Personal history of transient ischemic attack (TIA), and cerebral infarction without residual deficits: Secondary | ICD-10-CM

## 2018-08-27 DIAGNOSIS — R0683 Snoring: Secondary | ICD-10-CM

## 2018-09-01 ENCOUNTER — Telehealth: Payer: Self-pay

## 2018-09-01 NOTE — Telephone Encounter (Signed)
-----   Message from Huston FoleySaima Athar, MD sent at 09/01/2018  7:45 AM EST ----- Patient referred by Dr. Pearlean BrownieSethi, seen by me on 08/06/18, diagnostic PSG on 08/27/18.   Please call and notify the patient that the recent sleep study did not show any significant obstructive sleep apnea, with the exception of mild to moderate snoring. Weight loss and avoiding the supine sleep position will likely help reduce snoring. For disturbing snoring, an oral appliance (through a qualified dentist) can be considered. Please remind patient to try to maintain good sleep hygiene, which means: Keep a regular sleep and wake schedule and make enough time for sleep (7 1/2 to 8 1/2 hours for the average adult), try not to exercise or have a meal within 2 hours of your bedtime, try to keep your bedroom conducive for sleep, that is, cool and dark, without light distractors such as an illuminated alarm clock, and refrain from watching TV right before sleep or in the middle of the night and do not keep the TV or radio on during the night. If a nightlight is used, have it away from the visual field. Also, try not to use or play on electronic devices at bedtime, such as your cell phone, tablet PC or laptop. If you like to read at bedtime on an electronic device, try to dim the background light as much as possible. Do not eat in the middle of the night. Keep pets away from the bedroom environment. For stress relief, try meditation, deep breathing exercises (there are many books and CDs available), a white noise machine or fan can help to diffuse other noise distractors, such as traffic noise. Do not drink alcohol before bedtime, as it can disturb sleep and cause middle of the night awakenings. Never mix alcohol and sedating medications! Avoid narcotic pain medication close to bedtime, as opioids/narcotics can suppress breathing drive and breathing effort.   He can FU with Dr. Pearlean BrownieSethi and PCP as planned.  Thanks,  Huston FoleySaima Athar, MD, PhD [facility]

## 2018-09-01 NOTE — Telephone Encounter (Signed)
I called pt. I advised pt that Dr. Frances FurbishAthar reviewed pt's sleep study and found that pt did not show any significant osa with the exception of mild to moderate snoring. If this snoring is disturbing to the pt, Dr. Frances FurbishAthar recommends that pt consider an oral appliance to treat snoring. The oral appliance should be made by a qualified dentist. Dr. Frances FurbishAthar recommends that pt avoid sleeping supine and work on weight loss. I reviewed sleep hygiene recommendations with the pt, including trying to keep a regular sleep wake schedule, avoiding electronics in the bedroom, keeping the bedroom cool, dark, and quiet, and avoiding eating or exercising within 2 hours of bedtime as well as eating in the middle of the night. I advised pt to keep pets out of the bedroom. I discussed with pt the importance of stress relief and to try meditation, deep breathing exercises, and/or a white noise machine or fan to diffuse other noise distractors. I advised pt to not drink alcohol before bedtime and to never mix alcohol and sedating medications. Pt was advised to avoid narcotic pain medication close to bedtime. I advised pt that a copy of these sleep study results will be sent to Dr. Pearlean BrownieSethi. Pt verbalized understanding of results. Pt had no questions at this time but was encouraged to call back if questions arise.

## 2018-09-01 NOTE — Procedures (Signed)
PATIENT'S NAME:  Jared Ramsey, [name] DOB:      Jul 17, 1986      MR#:    [id number]     DATE OF RECORDING: 08/27/2018 REFERRING M.D.:  Delia HeadySethi, Pramod Study Performed:   Baseline Polysomnogram HISTORY: 32 year old right-handed gentleman with an underlying medical history of TIA, cerebellar stroke, hypertension, diabetes, and morbid obesity, who reports snoring and daytime sleepiness. The patient endorsed the Epworth Sleepiness Scale at 7 points. The patient's weight 301 pounds with a height of 73 (inches), resulting in a BMI of 40. kg/m2. The patient's neck circumference measured 18.5 inches.  CURRENT MEDICATIONS: Aspirin, Lipitor, Pepcid, Prinivil, Claritin, Glucophage   PROCEDURE:  This is a multichannel digital polysomnogram utilizing the Somnostar 11.2 system.  Electrodes and sensors were applied and monitored per AASM Specifications.   EEG, EOG, Chin and Limb EMG, were sampled at 200 Hz.  ECG, Snore and Nasal Pressure, Thermal Airflow, Respiratory Effort, CPAP Flow and Pressure, Oximetry was sampled at 50 Hz. Digital video and audio were recorded.      BASELINE STUDY  Lights Out was at 21:09 and Lights On at 03:15.  Total recording time (TRT) was 367 minutes, with a total sleep time (TST) of 273.5 minutes.   The patient's sleep latency to persistent sleep was 56 minutes, which is delayed. REM latency was 213.5 minutes, which is markedly delayed. The sleep efficiency was 74.5%, which is reduced.     SLEEP ARCHITECTURE: WASO (Wake after sleep onset) was 67 minutes with moderate sleep fragmentation noted. There were 21 minutes in Stage N1, 215.5 minutes Stage N2, 17 minutes Stage N3 and 20 minutes in Stage REM.  The percentage of Stage N1 was 7.7%, Stage N2 was 78.8%, which is increased, Stage N3 was 6.2% and Stage R (REM sleep) was 7.3%, which is reduced. The arousals were noted as: 62 were spontaneous, 0 were associated with PLMs, 1 were associated with respiratory events.  RESPIRATORY ANALYSIS:  There  were a total of 2 respiratory events:  0 obstructive apneas, 0 central apneas and 0 mixed apneas with a total of 0 apneas and an apnea index (AI) of 0 /hour. There were 2 hypopneas with a hypopnea index of .4 /hour. The patient also had 0 respiratory event related arousals (RERAs).      The total APNEA/HYPOPNEA INDEX (AHI) was .4/hour and the total RESPIRATORY DISTURBANCE INDEX was .4 /hour.  0 events occurred in REM sleep and 4 events in NREM. The REM AHI was 0 /hour, versus a non-REM AHI of .5. The patient spent 38.5 minutes of total sleep time in the supine position and 235 minutes in non-supine.. The supine AHI was 0.0 versus a non-supine AHI of 0.5.  OXYGEN SATURATION & C02:  The Wake baseline 02 saturation was 98%, with the lowest being 92%. Time spent below 89% saturation equaled 0 minutes.  PERIODIC LIMB MOVEMENTS: The patient had a total of 0 Periodic Limb Movements.  The Periodic Limb Movement (PLM) index was 0 and the PLM Arousal index was 0/hour.  Audio and video analysis did not show any abnormal or unusual movements, behaviors, phonations or vocalizations. The patient took one bathroom break. Mild to moderate snoring was noted. The EKG was in keeping with normal sinus rhythm (NSR).  Post-study, the patient indicated that sleep was the same as usual.   IMPRESSION:  1. Primary Snoring 2. Dysfunctions associated with sleep stages or arousal from sleep  RECOMMENDATIONS:  1. This study does not demonstrate any significant obstructive or central  sleep disordered breathing with the exception of mild to moderate snoring. Weight loss and avoiding the supine sleep position will likely help reduce snoring. For disturbing snoring, an oral appliance (through a qualified dentist) can be considered. This study does not support an intrinsic sleep disorder as a cause of the patient's symptoms. Other causes, including circadian rhythm disturbances, an underlying mood disorder, medication effect and/or  an underlying medical problem cannot be ruled out. 2. This study shows sleep fragmentation and abnormal sleep stage percentages; these are nonspecific findings and per se do not signify an intrinsic sleep disorder or a cause for the patient's sleep-related symptoms. Causes include (but are not limited to) the first night effect of the sleep study, circadian rhythm disturbances, medication effect or an underlying mood disorder or medical problem.  3. The patient should be cautioned not to drive, work at heights, or operate dangerous or heavy equipment when tired or sleepy. Review and reiteration of good sleep hygiene measures should be pursued with any patient. 4. The patient will be advised to follow up with the referring provider, who will be notified of the test results.  I certify that I have reviewed the entire raw data recording prior to the issuance of this report in accordance with the Standards of Accreditation of the American Academy of Sleep Medicine (AASM)   Huston Foley, MD, PhD Diplomat, American Board of Neurology and Sleep Medicine (Neurology and Sleep Medicine)

## 2018-09-01 NOTE — Progress Notes (Signed)
Patient referred by Dr. Pearlean BrownieSethi, seen by me on 08/06/18, diagnostic PSG on 08/27/18.   Please call and notify the patient that the recent sleep study did not show any significant obstructive sleep apnea, with the exception of mild to moderate snoring. Weight loss and avoiding the supine sleep position will likely help reduce snoring. For disturbing snoring, an oral appliance (through a qualified dentist) can be considered. Please remind patient to try to maintain good sleep hygiene, which means: Keep a regular sleep and wake schedule and make enough time for sleep (7 1/2 to 8 1/2 hours for the average adult), try not to exercise or have a meal within 2 hours of your bedtime, try to keep your bedroom conducive for sleep, that is, cool and dark, without light distractors such as an illuminated alarm clock, and refrain from watching TV right before sleep or in the middle of the night and do not keep the TV or radio on during the night. If a nightlight is used, have it away from the visual field. Also, try not to use or play on electronic devices at bedtime, such as your cell phone, tablet PC or laptop. If you like to read at bedtime on an electronic device, try to dim the background light as much as possible. Do not eat in the middle of the night. Keep pets away from the bedroom environment. For stress relief, try meditation, deep breathing exercises (there are many books and CDs available), a white noise machine or fan can help to diffuse other noise distractors, such as traffic noise. Do not drink alcohol before bedtime, as it can disturb sleep and cause middle of the night awakenings. Never mix alcohol and sedating medications! Avoid narcotic pain medication close to bedtime, as opioids/narcotics can suppress breathing drive and breathing effort.   He can FU with Dr. Pearlean BrownieSethi and PCP as planned.  Thanks,  Huston FoleySaima Znya Albino, MD, PhD [facility])

## 2018-10-23 DIAGNOSIS — E785 Hyperlipidemia, unspecified: Secondary | ICD-10-CM

## 2018-10-23 DIAGNOSIS — E1169 Type 2 diabetes mellitus with other specified complication: Secondary | ICD-10-CM | POA: Insufficient documentation

## 2018-10-23 NOTE — Progress Notes (Signed)
FOLLOW UP  Assessment and Plan:    Hypertension, unspecified type Continue medication- increase to lisinopril 20 mg daily for BP goal 130/80 Monitor blood pressure at home; call if consistently over 130/80 Continue DASH diet.   Reminder to go to the ER if any CP, SOB, nausea, dizziness, severe HA, changes vision/speech, left arm numbness and tingling and jaw pain. -     COMPLETE METABOLIC PANEL WITH GFR -     Magnesium  Type 2 diabetes mellitus with other specified complication, without long-term current use of insulin [facility]) Education: Reviewed 'ABCs' of diabetes management (respective goals in parentheses):  A1C (<7), blood pressure (<130/80), and cholesterol (LDL <70) Eye Exam yearly and Dental Exam every 6 months. Dietary recommendations Physical Activity recommendations He is doing well with 2000 mg daily metformin; strongly motivated to work on lifestyle and we are willing to work with this as long as we continue to see progress with lifestyle modification and weight loss going forward -     Hemoglobin A1c  History of CVA (cerebrovascular accident) Avoid vasocontrictive meds- reviewed these with patient today; continue ASA;  Control blood pressure, cholesterol, glucose, increase exercise.  -     Lipid panel  Morbid obesity (HCC) Long discussion about weight loss, diet, and exercise Recommended diet heavy in fruits and veggies and low in animal meats, cheeses, and dairy products, appropriate calorie intake Patient will work on cutting down on portions, avoiding binge eating/drinking Discussed appropriate weight for height and initial goal (275lb) - he is confident he can achieve this in the next 3-6 months Follow up at next visit  Former smoker Has quit smoking Strategies for ongoing success discussed  Hyperlipidemia associated with type 2 diabetes mellitus (HCC) Continue medications: rosuvastatin 40 mg daily  Continue low cholesterol diet and exercise.  Check lipid  panel.  -     Lipid panel -     TSH  Presented with immunization history record today demonstrating last Tdap in 2009; discussed and boosted with Td today  Continue diet and meds as discussed. Further disposition pending results of labs. Discussed med's effects and SE's.   Over 30 minutes of exam, counseling, chart review, and critical decision making was performed.   Future Appointments  Date Time Provider Department Center  11/02/2018  3:30 PM Micki Riley, MD [facility] None  07/27/2019  2:00 PM Judd Gaudier, NP [facility] None    ----------------------------------------------------------------------------------------------------------------------  HPI 33 y.o. male  presents for 3 month follow up on hypertension, cholesterol, diabetes, morbid obesity and vitamin D deficiency.   Last year in 07/2018 he was evaluated in ED for right sided headache and extremity heaviness. CTA head showed Subtle low-density in the superior paramedian right cerebellum, compared to MRI in 2017.  He was evaluated by Dr. Pearlean Brownie and concluded episode of left hemispheric TIA in October 2019 likely from vasoconstriction from Sudafed use.  Prior history of likely right cerebellar infarct in 2017 secondary to cerebral reversible vasoconstriction syndrome while using phentermine and postcoital headaches.  Both episodes apparently happened in the setting of using vasoactive medications.  Multiple vascular risk factors of obesity, diabetes, hypertension, hyperlipidemia; sleep apnea was considered though sleep study did not show evidence of this. He was recommended aggressive lifestyle modification and avoidance of vasoconstrictive agents, sudafed, phentermiene.   BMI is Body mass index is 40.66 kg/m., he admits he didn't do well over the holidays, but is back fully on his diet as of 2 weeks. He is avoiding eating in the  truck at work (he feels worst snacking is during this time), he is working with wife to improve meals,  may have whole grain english muffin with egg and cheese, maybe sausage in the morning. Limits eating during the day, will have whole orange, celery, carrots to snack on during the day. For dinner having more veggies, trying to limit meat intake. He is doing 50 stair steps per each leg during the day, getting in 15-20 min of intentional walking while at work. He has cut down on beer from a case every weekend down to 2-3 per day. He is drinking black coffee and water only during the day.  Wt Readings from Last 3 Encounters:  10/26/18 (!) 308 lb 3.2 oz (139.8 kg)  08/06/18 (!) 301 lb (136.5 kg)  07/30/18 (!) 303 lb (137.4 kg)   His blood pressure has been controlled at home (130/80s), today their BP is BP: (!) 150/90  He does workout. He denies chest pain, shortness of breath, dizziness.   He is on cholesterol medication Rosuvastatin 40 mg daily and denies myalgias. His cholesterol is not at goal. The cholesterol last visit was:   Lab Results  Component Value Date   CHOL 156 07/30/2018   HDL 37 (L) 07/30/2018   LDLCALC 97 07/30/2018   TRIG 108 07/30/2018   CHOLHDL 4.2 07/30/2018    He has been working on diet and exercise for T2DM newly on metformin 2000 mg daily, working aggressively on lifestyle per patient preference, and denies increased appetite, nausea, paresthesia of the feet, polydipsia, polyuria, visual disturbances and vomiting. He reports fasting values down from 180 to 150-160, and 2 hours post-prandial typically runs 100-120. Last A1C in the office was:  Lab Results  Component Value Date   HGBA1C 10.7 (H) 07/20/2018     Current Medications:  Current Outpatient Medications on File Prior to Visit  Medication Sig  . aspirin 81 MG chewable tablet Chew 81 mg by mouth daily.   . famotidine (PEPCID) 10 MG tablet Take 10 mg by mouth as needed.   Marland Kitchen. lisinopril (PRINIVIL,ZESTRIL) 10 MG tablet Take 1 tablet (10 mg total) by mouth daily.  Marland Kitchen. loratadine (CLARITIN) 10 MG tablet Take 10 mg  by mouth daily as needed.   . metFORMIN (GLUCOPHAGE) 500 MG tablet Take 2 tabs with largest meal of the day and 1 tab with each other meal for a total of 4 tabs daily.  . rosuvastatin (CRESTOR) 40 MG tablet Take 1 tablet (40 mg total) by mouth daily.   No current facility-administered medications on file prior to visit.      Allergies:  Allergies  Allergen Reactions  . Codeine Swelling     Medical History:  Past Medical History:  Diagnosis Date  . Diabetes mellitus without complication (HCC)   . Hypertension   . Morbid obesity (HCC)   . Stroke [facility](HCC)    "mini stroke"   Family history- Reviewed and unchanged Social history- Reviewed and unchanged   Review of Systems:  Review of Systems  Constitutional: Negative for malaise/fatigue and weight loss.  HENT: Negative for hearing loss and tinnitus.   Eyes: Negative for blurred vision and double vision.  Respiratory: Negative for cough, shortness of breath and wheezing.   Cardiovascular: Negative for chest pain, palpitations, orthopnea, claudication and leg swelling.  Gastrointestinal: Negative for abdominal pain, blood in stool, constipation, diarrhea, heartburn, melena, nausea and vomiting.  Genitourinary: Negative.   Musculoskeletal: Negative for joint pain and myalgias.  Skin: Negative for  rash.  Neurological: Negative for dizziness, tingling, sensory change, weakness and headaches.  Endo/Heme/Allergies: Negative for polydipsia.  Psychiatric/Behavioral: Negative.   All other systems reviewed and are negative.     Physical Exam: BP (!) 150/90   Pulse 83   Temp (!) 97.3 F (36.3 C)   Ht 6\' 1"  (1.854 m)   Wt (!) 308 lb 3.2 oz (139.8 kg)   SpO2 98%   BMI 40.66 kg/m  Wt Readings from Last 3 Encounters:  10/26/18 (!) 308 lb 3.2 oz (139.8 kg)  08/06/18 (!) 301 lb (136.5 kg)  07/30/18 (!) 303 lb (137.4 kg)   General Appearance: Well nourished, in no apparent distress. Eyes: PERRLA, EOMs, conjunctiva no swelling or  erythema Sinuses: No Frontal/maxillary tenderness ENT/Mouth: Ext aud canals clear, TMs without erythema, bulging. No erythema, swelling, or exudate on post pharynx.  Tonsils not swollen or erythematous. Hearing normal.  Neck: Supple, thyroid normal.  Respiratory: Respiratory effort normal, BS equal bilaterally without rales, rhonchi, wheezing or stridor.  Cardio: RRR with no MRGs. Brisk peripheral pulses without edema.  Abdomen: Soft, + BS.  Non tender, no guarding, rebound, hernias, masses. Lymphatics: Non tender without lymphadenopathy.  Musculoskeletal: Full ROM, 5/5 strength, Normal gait Skin: Warm, dry without rashes, lesions, ecchymosis.  Neuro: Cranial nerves intact. No cerebellar symptoms.  Psych: Awake and oriented X 3, normal affect, Insight and Judgment appropriate.    Dan Maker, NP 4:41 PM [facility]

## 2018-10-26 ENCOUNTER — Encounter: Payer: Self-pay | Admitting: Adult Health

## 2018-10-26 ENCOUNTER — Ambulatory Visit: Payer: BLUE CROSS/BLUE SHIELD | Admitting: Adult Health

## 2018-10-26 VITALS — BP 150/90 | HR 83 | Temp 97.3°F | Ht 73.0 in | Wt 308.2 lb

## 2018-10-26 DIAGNOSIS — E785 Hyperlipidemia, unspecified: Secondary | ICD-10-CM

## 2018-10-26 DIAGNOSIS — Z79899 Other long term (current) drug therapy: Secondary | ICD-10-CM

## 2018-10-26 DIAGNOSIS — E1169 Type 2 diabetes mellitus with other specified complication: Secondary | ICD-10-CM

## 2018-10-26 DIAGNOSIS — E119 Type 2 diabetes mellitus without complications: Secondary | ICD-10-CM | POA: Diagnosis not present

## 2018-10-26 DIAGNOSIS — I6521 Occlusion and stenosis of right carotid artery: Secondary | ICD-10-CM

## 2018-10-26 DIAGNOSIS — Z23 Encounter for immunization: Secondary | ICD-10-CM

## 2018-10-26 DIAGNOSIS — Z8673 Personal history of transient ischemic attack (TIA), and cerebral infarction without residual deficits: Secondary | ICD-10-CM

## 2018-10-26 DIAGNOSIS — I10 Essential (primary) hypertension: Secondary | ICD-10-CM | POA: Diagnosis not present

## 2018-10-26 DIAGNOSIS — F1010 Alcohol abuse, uncomplicated: Secondary | ICD-10-CM

## 2018-10-26 MED ORDER — LISINOPRIL 20 MG PO TABS: 20.0000 mg | ORAL_TABLET | Freq: Every day | ORAL | 1 refills | Status: DC

## 2018-10-26 MED ORDER — ROSUVASTATIN CALCIUM 40 MG PO TABS: 40.0000 mg | ORAL_TABLET | Freq: Every day | ORAL | 1 refills | Status: DC

## 2018-10-26 NOTE — Patient Instructions (Signed)
Goals    . Blood Pressure < 130/80    . DIET - INCREASE WATER INTAKE     80-100 fluid ounces    . Exercise 150 min/wk Moderate Activity    . HEMOGLOBIN A1C < 7.0    . LDL CALC < 70    . Weight (lb) < 275 lb (124.7 kg)      "left hemispheric TIA as well as previous episode of likely right cerebellar infarct due to cerebral reversible vasoconstriction syndrome.  Both episodes appear to be triggered by using phentermine and Sudafed and I advised him to stay away from these.  Continue aspirin for stroke prevention and aggressive risk factor modification with strict control of hypertension with blood pressure goal below 130/90, lipids with LDL cholesterol goal below 70 mg percent and diabetes with hemoglobin A1c goal below 6.5%.  I encouraged him to eat a healthy diet with lots of fruits, vegetables, cereals and whole grains and to be active and to lose weight.  Check polysomnogram for sleep apnea and transcranial Doppler bubble study for PFO.  Check lipid profile, hemoglobin A1c, anticardiolipin antibodies and ANA. . I have advised him in the future to stay away from vasoactive medications like cough and cold medicines and migraine medications like triptan's"   Know what a healthy weight is for you (roughly BMI <25) and aim to maintain this  Aim for 7+ servings of fruits and vegetables daily  65-80+ fluid ounces of water or unsweet tea for healthy kidneys  Limit to max 1 drink of alcohol per day; avoid smoking/tobacco  Limit animal fats in diet for cholesterol and heart health - choose grass fed whenever available  Avoid highly processed foods, and foods high in saturated/trans fats  Aim for low stress - take time to unwind and care for your mental health  Aim for 150 min of moderate intensity exercise weekly for heart health, and weights twice weekly for bone health  Aim for 7-9 hours of sleep        When it comes to diets, agreement about the perfect plan isn't easy to find, even  among the experts. Experts at the [employer] developed an idea known as the Healthy Eating Plate. Just imagine a plate divided into logical, healthy portions.  The emphasis is on diet quality:  Load up on vegetables and fruits - one-half of your plate: Aim for color and variety, and remember that potatoes don't count.  Go for whole grains - one-quarter of your plate: Whole wheat, barley, wheat berries, quinoa, oats, brown rice, and foods made with them. If you want pasta, go with whole wheat pasta.  Protein power - one-quarter of your plate: Fish, chicken, beans, and nuts are all healthy, versatile protein sources. Limit red meat.  The diet, however, does go beyond the plate, offering a few other suggestions.  Use healthy plant oils, such as olive, canola, soy, corn, sunflower and peanut. Check the labels, and avoid partially hydrogenated oil, which have unhealthy trans fats.  If you're thirsty, drink water. Coffee and tea are good in moderation, but skip sugary drinks and limit milk and dairy products to one or two daily servings.  The type of carbohydrate in the diet is more important than the amount. Some sources of carbohydrates, such as vegetables, fruits, whole grains, and beans-are healthier than others.  Finally, stay active.       Bad carbs also include fruit juice, alcohol, and sweet tea. These are empty  calories that do not signal to your brain that you are full.   Please remember the good carbs are still carbs which convert into sugar. So please measure them out no more than 1/2-1 cup of rice, oatmeal, pasta, and beans  Veggies are however free foods! Pile them on.   Not all fruit is created equal. Please see the list below, the fruit at the bottom is higher in sugars than the fruit at the top. Please avoid all dried fruits.

## 2018-10-27 LAB — COMPLETE METABOLIC PANEL WITH GFR
AG RATIO: 2.1 (calc) (ref 1.0–2.5)
ALT: 39 U/L (ref 9–46)
AST: 19 U/L (ref 10–40)
Albumin: 5 g/dL (ref 3.6–5.1)
Alkaline phosphatase (APISO): 44 U/L (ref 40–115)
BILIRUBIN TOTAL: 0.9 mg/dL (ref 0.2–1.2)
BUN: 14 mg/dL (ref 7–25)
CALCIUM: 10.3 mg/dL (ref 8.6–10.3)
CHLORIDE: 101 mmol/L (ref 98–110)
CO2: 27 mmol/L (ref 20–32)
Creat: 0.94 mg/dL (ref 0.60–1.35)
GFR, Est African American: 123 mL/min/{1.73_m2} (ref >=60)
GFR, Est Non African American: 106 mL/min/{1.73_m2} (ref >=60)
Globulin: 2.4 g/dL (calc) (ref 1.9–3.7)
Glucose, Bld: 114 mg/dL — ABNORMAL HIGH (ref 65–99)
POTASSIUM: 4.1 mmol/L (ref 3.5–5.3)
Sodium: 138 mmol/L (ref 135–146)
Total Protein: 7.4 g/dL (ref 6.1–8.1)

## 2018-10-27 LAB — LIPID PANEL
CHOLESTEROL: 134 mg/dL (ref <200)
HDL: 41 mg/dL (ref >40)
LDL Cholesterol (Calc): 76 mg/dL (calc)
Non-HDL Cholesterol (Calc): 93 mg/dL (calc) (ref <130)
Total CHOL/HDL Ratio: 3.3 (calc) (ref <5.0)
Triglycerides: 83 mg/dL (ref <150)

## 2018-10-27 LAB — HEMOGLOBIN A1C
EAG (MMOL/L): 9.7 (calc)
Hgb A1c MFr Bld: 7.7 % of total Hgb — ABNORMAL HIGH (ref <5.7)
Mean Plasma Glucose: 174 (calc)

## 2018-10-27 LAB — MAGNESIUM: Magnesium: 2.1 mg/dL (ref 1.5–2.5)

## 2018-11-02 ENCOUNTER — Ambulatory Visit: Payer: BLUE CROSS/BLUE SHIELD | Admitting: Neurology

## 2018-11-02 ENCOUNTER — Encounter: Payer: Self-pay | Admitting: Neurology

## 2018-11-02 VITALS — BP 130/81 | HR 85 | Ht 73.0 in | Wt 308.0 lb

## 2018-11-02 DIAGNOSIS — G459 Transient cerebral ischemic attack, unspecified: Secondary | ICD-10-CM | POA: Diagnosis not present

## 2018-11-02 NOTE — Progress Notes (Signed)
[facility] [address] [place]. [place] [address] [phone]       OFFICE CONSULT NOTE  Jared. Jared Ramsey Date of Birth:  April 11, 1986 Medical Record Number:  [id number]   Referring MD: Judd Gaudier, NP Reason for Referral: TIA  HPI:  Initial visit 07/30/2018 ; Jared Ramsey who is seen today for initial consultation visit for TIA and stroke.  History is obtained from the patient and review of electronic medical records.  I personally reviewed imaging films.  The patient states that on 07/14/2018 he developed sudden onset of nausea, dry heaving as well as some swallowing difficulties and noticed heaviness of the right face arm and leg after picking up his daughter from school.Marland Kitchen  He describes a mild 3/10 pressure type headache over the right eye in the right temporal region as well as some tightness in the right side of the neck.  Symptoms lasted about 4 hours and then resolved completely.  The day prior to he was actually seen in the emergency room and was found to have significantly elevated blood pressure greater than 190 systolic.  The patient had been suffering from an cold and had been taking Sudafed recently prior to the present episode..  The patient's blood pressure was 190 systolic when he went to urgent care on 07/13/2018 but today in the office it is 128/79.  Interestingly the patient initially developed postcoital headaches on several occasions in October 2017.  He was eventually seen at [facility] neurology clinic and an MRI scan of the brain was obtained on 08/13/2016 which showed a subacute right superior cerebellar infarct.  MRA of the brain showed irregularity of the proximal right middle cerebral artery.  Patient was on phentermine for weight loss and this was felt to be cerebral reversible vasoconstriction syndrome.  This was discontinued.  He had a second MRI on 09/23/2016 which showed the right cerebellar infarct.  Interestingly on the  CT angiogram that he had recently on 07/14/2018 the right middle cerebral artery irregularities no longer seen.  He was found to have elevated LDL cholesterol of 172 mg percent and hemoglobin A1c of 10.9 on 07/14/2018.  Patient does complain of snoring, restless sleep and daytime sleepiness.  He has not been evaluated for sleep apnea.He is tolerating aspirin well without bruising or bleeding.  He remains on Lipitor 40 mg which was recently started but follow-up lipid profile has not yet been checked.  He states his sugars are well controlled even though the last A1c was quite high Update 11/02/2018 : He returns for follow-up after last visit 3 months ago.  He states he is doing well without recurrent stroke or TIA symptoms.  Is tolerating aspirin well without bruising or bleeding.  His blood pressure was elevated and primary care physician has increased the dose of Zestril to 20 mg and is doing much better and today it is 130/81.  He has also change his Lipitor to Crestor and is doing better and LDL cholesterol on 10/26/2018 was nearly optimal at 76 mg percent.  Hemoglobin A1c also has come down from 10.7 to 7.7.  He had lab work on 07/30/2018 which were all negative for hypercoagulable panel and vasculitis.  Transcranial Doppler bubble study on 08/25/2018 was negative for PFO.  Polysomnogram study on 08/27/2018 was negative for obstructive sleep apnea.  Patient states she started eating healthy and is starting a weight loss program.  He is also increase his physical activity.  He wants  to lose weight.  He has noticed that he is snoring much less as per his wife since Lipitor was changed to Crestor. ROS:   14 system review of systems is positive for  snoring, daytime tiredness and all other systems negative  PMH:  Past Medical History:  Diagnosis Date  . Diabetes mellitus without complication (HCC)   . Hypertension   . Morbid obesity (HCC)   . Stroke [facility](HCC)    "mini stroke"    Social History:  Social  History   Socioeconomic History  . Marital status: Married    Spouse name: Not on file  . Number of children: 1  . Years of education: Not on file  . Highest education level: Not on file  Occupational History  . Not on file  Social Needs  . Financial resource strain: Not on file  . Food insecurity:    Worry: Not on file    Inability: Not on file  . Transportation needs:    Medical: Not on file    Non-medical: Not on file  Tobacco Use  . Smoking status: Former Smoker    Packs/day: 0.50    Years: 15.00    Pack years: 7.50    Types: Cigarettes    Last attempt to quit: 07/14/2018    Years since quitting: 0.3  . Smokeless tobacco: Never Used  Substance and Sexual Activity  . Alcohol use: Yes    Comment: 6 pack over the weekend  . Drug use: Never  . Sexual activity: Yes    Partners: Female    Birth control/protection: Implant  Lifestyle  . Physical activity:    Days per week: 0 days    Minutes per session: 0 min  . Stress: Only a little  Relationships  . Social connections:    Talks on phone: Not on file    Gets together: Not on file    Attends religious service: Not on file    Active member of club or organization: Not on file    Attends meetings of clubs or organizations: Not on file    Relationship status: Not on file  . Intimate partner violence:    Fear of current or ex partner: Not on file    Emotionally abused: Not on file    Physically abused: Not on file    Forced sexual activity: Not on file  Other Topics Concern  . Not on file  Social History Narrative  . Not on file    Medications:   Current Outpatient Medications on File Prior to Visit  Medication Sig Dispense Refill  . aspirin 81 MG chewable tablet Chew 81 mg by mouth daily.     . famotidine (PEPCID) 10 MG tablet Take 10 mg by mouth as needed.     Marland Kitchen. lisinopril (PRINIVIL,ZESTRIL) 20 MG tablet Take 1 tablet (20 mg total) by mouth daily. 90 tablet 1  . loratadine (CLARITIN) 10 MG tablet Take 10 mg  by mouth daily as needed.     . metFORMIN (GLUCOPHAGE) 500 MG tablet Take 2 tabs with largest meal of the day and 1 tab with each other meal for a total of 4 tabs daily. 360 tablet 1  . rosuvastatin (CRESTOR) 40 MG tablet Take 1 tablet (40 mg total) by mouth daily. 90 tablet 1   No current facility-administered medications on file prior to visit.     Allergies:   Allergies  Allergen Reactions  . Codeine Swelling    Physical Exam General: obese  young Caucasian Ramsey, seated, in no evident distress Head: head normocephalic and atraumatic.   Neck: supple with no carotid or supraclavicular bruits Cardiovascular: regular rate and rhythm, no murmurs Musculoskeletal: no deformity Skin:  no rash/petichiae Vascular:  Normal pulses all extremities  Neurologic Exam Mental Status: Awake and fully alert. Oriented to place and time. Recent and remote memory intact. Attention span, concentration and fund of knowledge appropriate. Mood and affect appropriate.  Cranial Nerves: Fundoscopic exam not done. Pupils equal, briskly reactive to light. Extraocular movements full without nystagmus. Visual fields full to confrontation. Hearing intact. Facial sensation intact. Face, tongue, palate moves normally and symmetrically.  Motor: Normal bulk and tone. Normal strength in all tested extremity muscles. Sensory.: intact to touch , pinprick , position and vibratory sensation.  Coordination: Rapid alternating movements normal in all extremities. Finger-to-nose and heel-to-shin performed accurately bilaterally. Gait and Station: Arises from chair without difficulty. Stance is normal. Gait demonstrates normal stride length and balance . Able to heel, toe and tandem walk without difficulty.  Reflexes: 1+ and symmetric. Toes downgoing.       ASSESSMENT: [age] year Caucasian Ramsey with episode of left hemispheric TIA in October 2019 likely from vasoconstriction from Sudafed use.  Prior history of likely right  cerebellar infarct in 2017 secondary to cerebral reversible vasoconstriction syndrome while using phentermine and postcoital headaches.  Both episodes apparently happened in the setting of using vasoactive medications.  Multiple vascular risk factors of obesity, diabetes, hypertension, hyperlipidemia .    PLAN: I had a long d/w patient about his recent TIA, risk for recurrent stroke/TIAs, personally independently reviewed imaging studies and stroke evaluation results and answered questions.Continue aspirin 81 mg daily  for secondary stroke prevention and maintain strict control of hypertension with blood pressure goal below 130/90, diabetes with hemoglobin A1c goal below 6.5% and lipids with LDL cholesterol goal below 70 mg/dL. I also advised the patient to eat a healthy diet with plenty of whole grains, cereals, fruits and vegetables, exercise regularly and maintain ideal body weight and lose weight.  Followup in the future with me in 1 year or call earlier if necessaryI have advised him in the future to stay away from vasoactive medications like cough and cold medicines and migraine medications like triptan's.  Greater than 50% time during this 25-minute consultation visit was spent on counseling and coordination of care about stroke, TIA, reversible cerebral vasoconstriction syndrome and answering questions     Delia HeadyPramod , MD  [facility] [address] [place], [place] [id number]  Phone [phone] Fax [phone] Note: This document was prepared with digital dictation and possible smart phrase technology. Any transcriptional errors that result from this process are unintentional.

## 2018-11-02 NOTE — Patient Instructions (Signed)
I had a long d/w patient about his recent TIA, risk for recurrent stroke/TIAs, personally independently reviewed imaging studies and stroke evaluation results and answered questions.Continue aspirin 81 mg daily  for secondary stroke prevention and maintain strict control of hypertension with blood pressure goal below 130/90, diabetes with hemoglobin A1c goal below 6.5% and lipids with LDL cholesterol goal below 70 mg/dL. I also advised the patient to eat a healthy diet with plenty of whole grains, cereals, fruits and vegetables, exercise regularly and maintain ideal body weight and lose weight.  Followup in the future with me in 1 year or call earlier if necessary

## 2018-12-20 DIAGNOSIS — J1089 Influenza due to other identified influenza virus with other manifestations: Secondary | ICD-10-CM | POA: Diagnosis not present

## 2018-12-23 ENCOUNTER — Other Ambulatory Visit: Payer: Self-pay | Admitting: Adult Health

## 2018-12-23 MED ORDER — FLUTICASONE PROPIONATE 50 MCG/ACT NA SUSP: 2.0000 | Freq: Every day | NASAL | 1 refills | Status: DC

## 2018-12-23 MED ORDER — PREDNISONE 20 MG PO TABS: ORAL_TABLET | ORAL | 0 refills | Status: DC

## 2019-01-14 DIAGNOSIS — K5900 Constipation, unspecified: Secondary | ICD-10-CM

## 2019-01-14 DIAGNOSIS — Z0289 Encounter for other administrative examinations: Secondary | ICD-10-CM

## 2019-01-14 NOTE — Telephone Encounter (Signed)
Virtual Visit via Telephone Note  I connected with Jared Ramsey on @TODAY @ at  by telephone and verified that I am speaking with the correct person using two identifiers.   I discussed the limitations, risks, security and privacy concerns of performing an evaluation and management service by telephone and the availability of in person appointments. I also discussed with the patient that there may be a patient responsible charge related to this service. The patient expressed understanding and agreed to proceed.   History of Present Illness:  33 y.o. male with hx of htn, T2DM, morbid obesity reqeusts evaluation for clearance to return to work. He reports he has had intermittent sensation of bloating, constipation over the past few days, has history of similar. He was feeling particularly bad this AM on the way to work and called his boss to notify and returned home and took dulcolax suppository and reports had BM and felt much better. He denies any other symptoms; he has been checking temps BID and reports ranging 98.0-98.7 degrees, this AM was 98.3. He denies known sick contacts, recent travel to high risk areas. He denies cough, dyspnea, headache, myalgias, fatigue or any other URI sx.    Observations/Objective:  General : Well sounding patient in no apparent distress HEENT: no hoarseness, no cough for duration of visit Lungs: speaks in complete sentences, no audible wheezing, no apparent distress Neurological: alert, oriented x 3 Psychiatric: pleasant, judgement appropriate   Assessment and Plan:  Diagnoses and all orders for this visit:  Constipation, unspecified constipation type Resolved with OTC dulcolax suppository  - Increase fiber/ water intake, decrease caffeine, increase activity level. Laboratory tests per orders. Please go to the hospital if you have severe abdominal pain, vomiting, fever, CP, SOB.   Encounter to obtain excuse from work Letter written to clear for work;  nothing suggests possible covid 19    Follow Up Instructions:  I discussed the assessment and treatment plan with the patient. The patient was provided an opportunity to ask questions and all were answered. The patient agreed with the plan and demonstrated an understanding of the instructions.   The patient was advised to call back or seek an in-person evaluation if the symptoms worsen or if the condition fails to improve as anticipated.  I provided 15 minutes of non-face-to-face time during this encounter.   Dan Maker, NP

## 2019-01-16 DIAGNOSIS — E1169 Type 2 diabetes mellitus with other specified complication: Secondary | ICD-10-CM

## 2019-01-16 MED ORDER — FREESTYLE LIBRE SENSOR SYSTEM MISC: 0 refills | Status: DC

## 2019-01-16 MED ORDER — GLUCOSE BLOOD VI STRP: ORAL_STRIP | 12 refills | Status: DC

## 2019-01-16 MED ORDER — FREESTYLE LIBRE 14 DAY READER DEVI: 1.0000 | Freq: Once | 2 refills | Status: AC

## 2019-01-16 NOTE — Addendum Note (Signed)
Addended by: Quentin Mulling R on: 01/16/2019 07:25 PM   Modules accepted: Orders

## 2019-01-18 ENCOUNTER — Other Ambulatory Visit: Payer: Self-pay | Admitting: Adult Health

## 2019-01-18 MED ORDER — ONDANSETRON HCL 8 MG PO TABS: 4.0000 mg | ORAL_TABLET | Freq: Three times a day (TID) | ORAL | 1 refills | Status: DC | PRN

## 2019-01-19 ENCOUNTER — Encounter: Payer: Self-pay | Admitting: Adult Health

## 2019-01-19 ENCOUNTER — Other Ambulatory Visit: Payer: Self-pay | Admitting: Adult Health

## 2019-01-19 DIAGNOSIS — E1169 Type 2 diabetes mellitus with other specified complication: Secondary | ICD-10-CM

## 2019-01-19 MED ORDER — FREESTYLE LIBRE SENSOR SYSTEM MISC: 2 refills | Status: DC

## 2019-01-22 DIAGNOSIS — R252 Cramp and spasm: Secondary | ICD-10-CM | POA: Diagnosis not present

## 2019-01-25 ENCOUNTER — Ambulatory Visit: Payer: BLUE CROSS/BLUE SHIELD | Admitting: Physician Assistant

## 2019-01-25 ENCOUNTER — Other Ambulatory Visit: Payer: Self-pay

## 2019-01-25 ENCOUNTER — Ambulatory Visit: Payer: BLUE CROSS/BLUE SHIELD | Admitting: Adult Health

## 2019-01-25 ENCOUNTER — Encounter: Payer: Self-pay | Admitting: Adult Health

## 2019-01-25 VITALS — BP 118/74 | HR 92 | Temp 96.3°F | Ht 73.0 in | Wt 301.0 lb

## 2019-01-25 DIAGNOSIS — Z9189 Other specified personal risk factors, not elsewhere classified: Secondary | ICD-10-CM | POA: Diagnosis not present

## 2019-01-25 DIAGNOSIS — R10811 Right upper quadrant abdominal tenderness: Secondary | ICD-10-CM

## 2019-01-25 DIAGNOSIS — R29898 Other symptoms and signs involving the musculoskeletal system: Secondary | ICD-10-CM

## 2019-01-25 DIAGNOSIS — R52 Pain, unspecified: Secondary | ICD-10-CM | POA: Diagnosis not present

## 2019-01-25 DIAGNOSIS — R110 Nausea: Secondary | ICD-10-CM

## 2019-01-25 MED ORDER — SUCRALFATE 1 G PO TABS: ORAL_TABLET | ORAL | 1 refills | Status: DC

## 2019-01-25 MED ORDER — OMEPRAZOLE 40 MG PO CPDR: 40.0000 mg | DELAYED_RELEASE_CAPSULE | Freq: Every day | ORAL | 1 refills | Status: DC

## 2019-01-25 NOTE — Progress Notes (Signed)
Assessment and Plan:  Jared Ramsey was seen today for emesis.  Diagnoses and all orders for this visit:  A vague feeling of discomfort/ Nausea Unclear etiology; seems possible he has multiple concurrent concerns though seems primary GI; ruling out possible peptic ulcer or gallbladder etiology based on history and exam Continue H2i; check h. Pylori today and starting omeprazole 40 mg daily, carafate Discussed continue bland, low fat diet Continue PRN zofran for nausea Will go to the ER if they have any melena (black stool), hematochezia (blood in stool), coffee ground vomiting, severe pain, severe shortness of breath or chest pain.  If labs unclear will obtain abd US/consider HIDA -     Urinalysis w microscopic + reflex cultur -     CBC with Differential/Platelet -     Lipase -     Amylase -     H. pylori breath test -     omeprazole (PRILOSEC) 40 MG capsule; Take 1 capsule (40 mg total) by mouth daily. 30 min prior to breakfast. -     sucralfate (CARAFATE) 1 g tablet; Dissolve in a few ounces of water and take prior to meals and bedtime.  Right upper quadrant abdominal tenderness without rebound tenderness -     Urinalysis w microscopic + reflex cultur -     CBC with Differential/Platelet -     Lipase -     Amylase  High risk of cardiac event Low suspicion; checking to r/o due to extreme patient anxiety -     EKG 12-Lead -     STAT Troponin I -  LE heaviness Resolved; workup by UC unremarkable including CK; unclear etiology Neuro exam unremarkable today, neg straight leg raise Monitor  Further disposition pending results of labs. Discussed med's effects and SE's.   Over 30 minutes of exam, counseling, chart review, and critical decision making was performed.   Future Appointments  Date Time Provider Department Center  02/02/2019  4:30 PM Elder Negus, NP [facility] None  07/27/2019  2:00 PM Judd Gaudier, NP [facility] None  11/02/2019  4:00 PM Micki Riley, MD [facility]  None    ------------------------------------------------------------------------------------------------------------------   HPI BP 118/74   Pulse 92   Temp (!) 96.3 F (35.7 C)   Ht  (1.854 m)   Wt (!) 301 lb (136.5 kg)   SpO2 98%   BMI 39.71 kg/m   33 y.o.male with hx of T2DM, hyperlipidemia, htn, morbid obesity, former smoker, hx of binge drinking (reports not recently), hx of CVA attributed to medication SE, presents for evaluation of episodes of intense sense of feeling unwell (no specific symptoms, does appear pale when this occurs) and nausea. He is accompanied by his wife today.   He is a [employer] and has been out of work for several days due to persistent intermittent ongoing symptoms.   He had type A flu  in March 15, with residual bronchitis, had lots of residual fatigue, finally got better and went back to work and was fine for 1 fully week.   Then he thinks he ate something bad on 01/04/2019, he had vomiting/diarrhea x 24 hours, eldest sister also had same symptoms and had same chicken wings that were "off." N/V/D resolved but had residual constipation, didn't have BM for full 3 days. He was very bloated and felt terrible, took dulcolax suppossitory and tabs, had a large BM after, felt resolved/back to baseline for a day or two.   He reports he had more vague abdominal  discomfort that day, had an epidode of emesis, threw everything up then felt back to baseline. Since then, no specific abdominal pain, not constipated, passing gas, no cramping, but has intermittent episodes of nausea (none in past 3 days, takes zofran PRN nausea and this works well).   In the last 4-5 days, he has had multiple episodes of overwhelming feeling of "not feeling right" - looks pale, very tired/sleepy, will sit or lay down and will resolve in a few hours. Has checked BP/pulse, glucose when this happens, hasn't been unusually low or high. He cannot associate episodes with meals, activities,  exertion. He questionably thinks this may happen a few hours after meals. Denies chest pain, palpitations, dypspnea, wheezing, dizziness, headache, fever/chills. He has been taking pepcid BID x 1 week which he does feel may have improved GI sx. He denies blood, bile in emesis, denies recent blood in stool, black/tarry stools. Denies flank pain, dysuria, changes in urine character.   He has not had alcohol in 3 weeks, does use NSAID (ibuprofen or aleve) intermittently, not significantly recently.   Per our recommendations he has been following bland diet, low fat diets.   He has additionally been reporting intermittent vague heaviness of bilateral lower extremities; he was concerned about ongoing symtpoms and presented to UC on 01/22/2019 and had CMP/GFR (unremarkable except Co2 18, calcium 10.3, albumin 5.1, ALT 51 - historically has mild intermittent ALT elevation). CK was 125. They additionally drew iron/tibc, b12, folate, TSH, magnesium which were unremarkable. CBC, urine, lipase amylase were not checked. He denies any episodes of LE heaviness in 3 days.   He has no abdominal surgical history; no relevant imaging for review.   He has multiple family members with hx of cholelithiasis/cholecystitis.   He is very anxious regarding this possibly being "something cardiac" and reports nearly presented to ED due to anxiety.    Past Medical History:  Diagnosis Date  . Diabetes mellitus without complication (HCC)   . Hypertension   . Morbid obesity (HCC)   . Stroke [facility](HCC)    "mini stroke"     Allergies  Allergen Reactions  . Codeine Swelling    Current Outpatient Medications on File Prior to Visit  Medication Sig  . aspirin 81 MG chewable tablet Chew 81 mg by mouth daily.   . Continuous Blood Gluc Sensor (FREESTYLE LIBRE SENSOR SYSTEM) MISC Check sugars 3 x a day DX E10.9  . famotidine (PEPCID) 10 MG tablet Take 10 mg by mouth as needed.   . fluticasone (FLONASE) 50 MCG/ACT nasal spray  Place 2 sprays into both nostrils daily.  Marland Kitchen. glucose blood (FREESTYLE LITE) test strip Test sugar once daily  . lisinopril (PRINIVIL,ZESTRIL) 20 MG tablet Take 1 tablet (20 mg total) by mouth daily.  Marland Kitchen. loratadine (CLARITIN) 10 MG tablet Take 10 mg by mouth daily as needed.   . metFORMIN (GLUCOPHAGE) 500 MG tablet Take 2 tabs with largest meal of the day and 1 tab with each other meal for a total of 4 tabs daily.  . ondansetron (ZOFRAN) 8 MG tablet Take 0.5 tablets (4 mg total) by mouth every 8 (eight) hours as needed for nausea or vomiting.  . rosuvastatin (CRESTOR) 40 MG tablet Take 1 tablet (40 mg total) by mouth daily.  . predniSONE (DELTASONE) 20 MG tablet 2 tablets daily for 3 days, 1 tablet daily for 4 days.   No current facility-administered medications on file prior to visit.     ROS: all negative except above.  Physical Exam:  BP 118/74   Pulse 92   Temp (!) 96.3 F (35.7 C)   Ht 6\' 1"  (1.854 m)   Wt (!) 301 lb (136.5 kg)   SpO2 98%   BMI 39.71 kg/m   General Appearance: Well nourished, obese, in no apparent distress. Eyes: PERRLA, EOMs, conjunctiva no swelling or erythema Sinuses: No Frontal/maxillary tenderness ENT/Mouth: No erythema, swelling, or exudate on post pharynx.  Tonsils not swollen or erythematous. Hearing normal.  Neck: Supple, thyroid normal.  Respiratory: Respiratory effort normal, BS equal bilaterally without rales, rhonchi, wheezing or stridor.  Cardio: RRR with no MRGs. Brisk peripheral pulses without edema.  Abdomen: Soft, + BS.  Generalized mild tenderness, pronounced RUQ tenderness, no guarding, rebound, hernias, no palpable masses. Lymphatics: Non tender without lymphadenopathy.  Musculoskeletal: Full ROM, 5/5 strength, normal gait.  Skin: Warm, dry without rashes, lesions, ecchymosis.  Neuro: Cranial nerves intact. Normal muscle tone, no cerebellar symptoms. Sensation intact.  Psych: Awake and oriented X 3, normal affect, Insight and Judgment  appropriate.     Dan Maker, NP 2:45 PM [facility]

## 2019-01-25 NOTE — Patient Instructions (Addendum)
We are checking EKG/troponin to check on your heart  You may have an ulcer; we are checking for h. hylori (bacterial cause) and will start you on a PPI type stomach acid medication  I am also giving you carafate that you can try; can dissolve in a few ounces of water and drink 15-30 min prior to meals and bedtime; this medication coats your stomach to protect an ulcer or irritation  This may be your gallbladder - checking some labs today, and if all else negative will get ultrasound, and possibly a HIDA scan to look more specifically at liver/gallbladder   Peptic Ulcer  A peptic ulcer is a sore in the lining of the stomach (gastric ulcer) or the first part of the small intestine (duodenal ulcer). The ulcer causes a gradual wearing away (erosion) of the deeper tissue. What are the causes? Normally, the lining of the stomach and the small intestine protects them from the acid that digests food. The protective lining can be damaged by:  An infection caused by a type of bacteria called Helicobacter pylori or H. pylori.  Regular use of NSAIDs, such as ibuprofen or aspirin.  Rare tumors in the stomach, small intestine, or pancreas (Zollinger-Ellison syndrome). What increases the risk? The following factors may make you more likely to develop this condition:  Smoking.  Having a family history of ulcer disease.  Drinking alcohol.  Having been hospitalized in an intensive care unit (ICU). What are the signs or symptoms? Symptoms of this condition include:  Persistent burning pain in the area between the chest and the belly button. The pain may be worse on an empty stomach and at night.  Heartburn.  Nausea and vomiting.  Bloating. If the ulcer results in bleeding, it can cause:  Black, tarry stools.  Vomiting of bright red blood.  Vomiting of material that looks like coffee grounds. How is this diagnosed? This condition may be diagnosed based on:  Your medical history and a  physical exam.  Various tests or procedures, such as: ? Blood tests, stool tests, or breath tests to check for the H. pylori bacteria. ? An X-ray exam (upper gastrointestinal series) of the esophagus, stomach, and small intestine. ? Upper endoscopy. The health care provider examines the esophagus, stomach, and small intestine using a small flexible tube that has a video camera at the end. ? Biopsy. A tissue sample is removed to be examined under a microscope. How is this treated? Treatment for this condition may include:  Eliminating the cause of the ulcer, such as smoking or use of NSAIDs, and limiting alcohol and caffeine intake.  Medicines to reduce the amount of acid in your digestive tract.  Antibiotic medicines, if the ulcer is caused by an H. pylori infection.  An upper endoscopy may be used to treat a bleeding ulcer.  Surgery. This may be needed if the bleeding is severe or if the ulcer created a hole somewhere in the digestive system. Follow these instructions at home:  Do not drink alcohol if your health care provider tells you not to drink.  Do not use any products that contain nicotine or tobacco, such as cigarettes, e-cigarettes, and chewing tobacco. If you need help quitting, ask your health care provider.  Take over-the-counter and prescription medicines only as told by your health care provider. ? Do not use over-the-counter medicines in place of prescription medicines unless your health care provider approves. ? Do not take aspirin, ibuprofen, or other NSAIDs unless your health care  provider told you to do so.  Take over-the-counter and prescription medicines only as told by your health care provider.  Keep all follow-up visits as told by your health care provider. This is important. Contact a health care provider if:  Your symptoms do not improve within 7 days of starting treatment.  You have ongoing indigestion or heartburn. Get help right away if:  You have  sudden, sharp, or persistent pain in your abdomen.  You have bloody or dark black, tarry stools.  You vomit blood or material that looks like coffee grounds.  You become light-headed or you feel faint.  You become weak.  You become sweaty or clammy. Summary  A peptic ulcer is a sore in the lining of the stomach (gastric ulcer) or the first part of the small intestine (duodenal ulcer). The ulcer causes a gradual wearing away (erosion) of the deeper tissue.  Do not use any products that contain nicotine or tobacco, such as cigarettes, e-cigarettes, and chewing tobacco. If you need help quitting, ask your health care provider.  Take over-the-counter and prescription medicines only as told by your health care provider. Do not use over-the-counter medicines in place of prescription medicines unless your health care provider approves.  Contact your health care provider if you have ongoing indigestion or heartburn.  Keep all follow-up visits as told by your health care provider. This is important. This information is not intended to replace advice given to you by your health care provider. Make sure you discuss any questions you have with your health care provider. Document Released: 09/20/2000 Document Revised: 03/31/2018 Document Reviewed: 03/31/2018 Elsevier Interactive Patient Education  2019 [employer]      Cholecystitis  Cholecystitis is inflammation of the gallbladder. It is often called a gallbladder attack. The gallbladder is a pear-shaped organ that lies beneath the liver on the right side of the body. The gallbladder stores bile, which is a fluid that helps the body digest fats. If bile builds up in your gallbladder, your gallbladder becomes inflamed. This condition may occur suddenly. Cholecystitis is a serious condition and requires treatment. What are the causes? The most common cause of this condition is gallstones. Gallstones can block the tube (duct) that carries bile  out of your gallbladder. This causes bile to build up. Other causes include:  Damage to the gallbladder due to a decrease in blood flow.  Infections in the bile ducts.  Scars or kinks in the bile ducts.  Tumors in the liver, pancreas, or gallbladder. What increases the risk? You are more likely to develop this condition if:  You have sickle cell disease.  You take birth control pills or use estrogen.  You have alcoholic liver disease.  You have liver cirrhosis.  You have your nutrition delivered through a vein (parenteral nutrition).  You are critically ill.  You do not eat or drink for a long time. This is also called "fasting."  You are obese.  You lose weight too fast.  You are pregnant.  You have high levels of fat (triglycerides) in the blood.  You have pancreatitis. What are the signs or symptoms? Symptoms of this condition include:  Pain in the abdomen, especially in the upper right area of the abdomen.  Tenderness or bloating in the abdomen.  Nausea.  Vomiting.  Fever.  Chills. How is this diagnosed? This condition is diagnosed with a medical history and physical exam. You may also have other tests, including:  Imaging tests, such as: ? An  ultrasound of the gallbladder. ? A CT scan of the abdomen. ? A gallbladder nuclear scan (HIDA scan). This scan allows your health care provider to see the bile moving from your liver to your gallbladder and on to your small intestine. ? MRI.  Blood tests, such as: ? A complete blood count. The white blood cell count may be higher than normal. ? Liver function tests. Certain types of gallstones cause some results to be higher than normal. How is this treated? Treatment may include:  Surgery to remove your gallbladder (cholecystectomy).  Antibiotic medicine, usually through an IV.  Fasting for a certain amount of time.  Giving IV fluids.  Medicine to treat pain or vomiting. Follow these instructions at  home:  If you had surgery, follow instructions from your health care provider about home care after the procedure. Medicines   Take over-the-counter and prescription medicines only as told by your health care provider.  If you were prescribed an antibiotic medicine, take it as told by your health care provider. Do not stop taking the antibiotic even if you start to feel better. General instructions  Follow instructions from your health care provider about what to eat or drink. When you are allowed to eat, avoid eating or drinking anything that triggers your symptoms.  Do not lift anything that is heavier than 10 lb (4.5 kg), or the limit that you are told, until your health care provider says that it is safe.  Do not use any products that contain nicotine or tobacco, such as cigarettes and e-cigarettes. If you need help quitting, ask your health care provider.  Keep all follow-up visits as told by your health care provider. This is important. Contact a health care provider if:  Your pain is not controlled with medicine.  You have a fever. Get help right away if:  Your pain moves to another part of your abdomen or to your back.  You continue to have symptoms or you develop new symptoms even with treatment. Summary  Cholecystitis is inflammation of the gallbladder.  The most common cause of this condition is gallstones. Gallstones can block the tube (duct) that carries bile out of your gallbladder.  Common symptoms are pain in the abdomen, nausea, vomiting, fever, and chills.  This condition is treated with surgery to remove the gallbladder, medicines, fasting, and IV fluids.  Follow your health care provider's instructions for eating and drinking. Avoid eating anything that triggers your symptoms. This information is not intended to replace advice given to you by your health care provider. Make sure you discuss any questions you have with your health care provider. Document  Released: 09/23/2005 Document Revised: 01/30/2018 Document Reviewed: 01/30/2018 Elsevier Interactive Patient Education  2019 [employer]

## 2019-01-26 LAB — URINALYSIS W MICROSCOPIC + REFLEX CULTURE
Bacteria, UA: NONE SEEN /HPF
Bilirubin Urine: NEGATIVE
Glucose, UA: NEGATIVE
Hgb urine dipstick: NEGATIVE
Leukocyte Esterase: NEGATIVE
Nitrites, Initial: NEGATIVE
RBC / HPF: NONE SEEN /HPF (ref 0–2)
Specific Gravity, Urine: 1.022 (ref 1.001–1.03)
pH: 5.5 (ref 5.0–8.0)

## 2019-01-26 LAB — CBC WITH DIFFERENTIAL/PLATELET
Absolute Monocytes: 698 cells/uL (ref 200–950)
Basophils Absolute: 68 cells/uL (ref 0–200)
Basophils Relative: 0.7 %
Eosinophils Absolute: 272 cells/uL (ref 15–500)
Eosinophils Relative: 2.8 %
HCT: 43.1 % (ref 38.5–50.0)
Hemoglobin: 14.7 g/dL (ref 13.2–17.1)
Lymphs Abs: 3376 cells/uL (ref 850–3900)
MCH: 29.2 pg (ref 27.0–33.0)
MCHC: 34.1 g/dL (ref 32.0–36.0)
MCV: 85.7 fL (ref 80.0–100.0)
MPV: 9.1 fL (ref 7.5–12.5)
Monocytes Relative: 7.2 %
Neutro Abs: 5287 cells/uL (ref 1500–7800)
Neutrophils Relative %: 54.5 %
Platelets: 357 10*3/uL (ref 140–400)
RBC: 5.03 10*6/uL (ref 4.20–5.80)
RDW: 12.9 % (ref 11.0–15.0)
Total Lymphocyte: 34.8 %
WBC: 9.7 10*3/uL (ref 3.8–10.8)

## 2019-01-26 LAB — H. PYLORI BREATH TEST: H. pylori Breath Test: NOT DETECTED

## 2019-01-26 LAB — TROPONIN I: Troponin I: <0.01 ng/mL (ref <=0.0)

## 2019-01-26 LAB — AMYLASE: Amylase: 33 U/L (ref 21–101)

## 2019-01-26 LAB — NO CULTURE INDICATED

## 2019-01-26 LAB — LIPASE: Lipase: 33 U/L (ref 7–60)

## 2019-01-27 ENCOUNTER — Other Ambulatory Visit: Payer: Self-pay | Admitting: Adult Health

## 2019-02-02 ENCOUNTER — Encounter: Payer: Self-pay | Admitting: Adult Health Nurse Practitioner

## 2019-02-02 ENCOUNTER — Ambulatory Visit: Payer: Self-pay | Admitting: Adult Health

## 2019-02-02 ENCOUNTER — Other Ambulatory Visit: Payer: Self-pay

## 2019-02-02 ENCOUNTER — Ambulatory Visit: Payer: BLUE CROSS/BLUE SHIELD | Admitting: Adult Health Nurse Practitioner

## 2019-02-02 VITALS — Wt 298.0 lb

## 2019-02-02 DIAGNOSIS — I10 Essential (primary) hypertension: Secondary | ICD-10-CM

## 2019-02-02 DIAGNOSIS — Z9109 Other allergy status, other than to drugs and biological substances: Secondary | ICD-10-CM | POA: Diagnosis not present

## 2019-02-02 DIAGNOSIS — E785 Hyperlipidemia, unspecified: Secondary | ICD-10-CM

## 2019-02-02 DIAGNOSIS — E1169 Type 2 diabetes mellitus with other specified complication: Secondary | ICD-10-CM | POA: Diagnosis not present

## 2019-02-02 DIAGNOSIS — E119 Type 2 diabetes mellitus without complications: Secondary | ICD-10-CM

## 2019-02-02 DIAGNOSIS — R52 Pain, unspecified: Secondary | ICD-10-CM

## 2019-02-02 DIAGNOSIS — Z8673 Personal history of transient ischemic attack (TIA), and cerebral infarction without residual deficits: Secondary | ICD-10-CM

## 2019-02-02 DIAGNOSIS — R29898 Other symptoms and signs involving the musculoskeletal system: Secondary | ICD-10-CM

## 2019-02-02 NOTE — Progress Notes (Signed)
Virtual Visit via Telephone Note  I connected with Jared Ramsey on 02/02/19 at  4:30 PM EDT by telephone and verified that I am speaking with the correct person using two identifiers.   I discussed the limitations, risks, security and privacy concerns of performing an evaluation and management service by telephone and the availability of in person appointments. I also discussed with the patient that there may be a patient responsible charge related to this service. The patient expressed understanding and agreed to proceed.   3 Month Follow Up   Assessment and Plan:    Jared Ramsey was seen today for follow-up.  Diagnoses and all orders for this visit:  Hypertension, unspecified type Hypertension Continue medication: Lisinopril 22m daily Monitor blood pressure at home; call if consistently over 130/80 Continue DASH diet.   Reminder to go to the ER if any CP, SOB, nausea, dizziness, severe HA, changes vision/speech, left arm numbness and tingling and jaw pain.  Diabetes mellitus without complication (HCC) Continue medications: Metformin 5042m 2 tabs BID Discussed general issues about diabetes pathophysiology and management., Educational material distributed., Suggested low cholesterol diet., Encouraged aerobic exercise., Discussed foot care., Reminded to get yearly retinal exam.  Hyperlipidemia associated with type 2 diabetes mellitus (HCGoodmanCholesterol Continue medications: Crestor 406maily Continue low cholesterol diet and exercise.  Check lipid panel next office visit  Environmental allergies Doing well at this time Continue current regiment  History of CVA (cerebrovascular accident) Control blood pressure, lipids and glucose Advised of weight loss Discussed dietary and exercise modifications  Morbid obesity ([place] made lifestyle changes Discussed dietary and exercise modifications Continue monitoring with Fitbit and increasing activity.  LE heaviness Improved  from last visit No further symptoms Continue to monitor  A vague feeling of discomfort Improved since last visit Continue to monitor Notify office with new or worsening symptoms  Continue diet and meds as discussed. Further disposition pending results of labs. Discussed med's effects and SE's.    Follow Up Instructions:  I discussed the assessment and treatment plan with the patient. The patient was provided an opportunity to ask questions and all were answered. The patient agreed with the plan and demonstrated an understanding of the instructions.   The patient was advised to call back or seek an in-person evaluation if the symptoms worsen or if the condition fails to improve as anticipated.   I provided 30 minutes of non-face-to-face time during this encounter including counseling, chart review, and critical decision making was preformed.   Future Appointments  Date Time Provider DepAu [place]0/20/2020  2:00 PM CorLiane ComberP [facility] None  11/02/2019  4:00 PM SetGarvin FilaD [facility] None    ----------------------------------------------------------------------------------------------------------------------  HPI [age]. male  presents for 3 month follow up on HTN, HLD,DMII, obesity and vitamin D deficiency.  He also has history of nicotine dependence, ETOH binge drinking, CVA related to medication SE.    Back on 12/20/18 he had Flu A with residual bronchitis and fatigue.  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  Then 12/25/18 he reports he potentially had food poisoning with V/D x24 hours.  He then had constipation for 3 days with bloating.  This was resolved with dulcolax suppository.  After this he had residual abdominal discomfort with an episode of emesis.  He was seen at an urgent care on 01/22/19 for intermittent vague heaviness of bilaterally lower extremities. He followed up with our office and started  on PPI and Rx for Carafate,  bland diet.  Patient reports overall he is doing well and has improved since last office visit.  He is taking omeprazole 19m and reports that he is no longer having symptoms from last visit.  He is no longer having flushing or nausea.  He did not use the carafate. Reports he no long has any abdominal discomfort.  Reports that he had some lower extremity heaviness at appointment eight days ago.  He reports that he has not had these symptoms since.     BMI is Body mass index is 39.32 kg/m., he has been working on diet and exercise.  Reports his wife gave him a Fitbit and he has been increasing the amount he walks.  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   Wt Readings from Last 3 Encounters:  02/02/19 298 lb (135.2 kg)  01/25/19 (!) 301 lb (136.5 kg)  11/02/18 (!) 308 lb (139.7 kg)     His blood pressure has been controlled at home, does not have a reading for today. He does workout. He denies any cardiac symptoms, chest pains, palpitations, shortness of breath, dizziness or lower extremity edema.     He is on cholesterol medication Rosuvastatin and denies myalgias. His cholesterol is at goal. The cholesterol last visit was:   Lab Results  Component Value Date   CHOL 134 10/26/2018   HDL 41 10/26/2018   LDLCALC 76 10/26/2018   TRIG 83 10/26/2018   CHOLHDL 3.3 10/26/2018    He has been working on diet and exercise for prediabetes, and denies hyperglycemia, hypoglycemia , increased appetite, nausea, paresthesia of the feet, polydipsia and polyuria. Last A1C in the office was:  Lab Results  Component Value Date   HGBA1C 7.7 (H) 10/26/2018     Current Medications:  Current Outpatient Medications on File Prior to Visit  Medication Sig  . aspirin 81 MG chewable tablet Chew 81 mg by mouth daily.   . Continuous Blood Gluc Sensor (FREESTYLE LIBRE SENSOR SYSTEM) MISC Check sugars 3 x a day DX E10.9  . famotidine (PEPCID) 10 MG  tablet Take 10 mg by mouth as needed.   . fluticasone (FLONASE) 50 MCG/ACT nasal spray Place 2 sprays into both nostrils daily.  .Marland Kitchenglucose blood (FREESTYLE LITE) test strip Test sugar once daily  . lisinopril (PRINIVIL,ZESTRIL) 20 MG tablet Take 1 tablet (20 mg total) by mouth daily.  .Marland Kitchenloratadine (CLARITIN) 10 MG tablet Take 10 mg by mouth daily as needed.   . metFORMIN (GLUCOPHAGE) 500 MG tablet TAKE TWO TABLETS BY MOUTH WITH LARGEST MEAL OF THE DAY AND TAKE ONE TABLET WITH EACH OTHER MEAL FOR A TOTAL OF 4 DAILY  . omeprazole (PRILOSEC) 40 MG capsule Take 1 capsule (40 mg total) by mouth daily. 30 min prior to breakfast.  . rosuvastatin (CRESTOR) 40 MG tablet Take 1 tablet (40 mg total) by mouth daily.   No current facility-administered medications on file prior to visit.     Allergies:  Allergies  Allergen Reactions  . Codeine Swelling     Medical History:  Past Medical History:  Diagnosis Date  . Diabetes mellitus without complication ([place]   . Hypertension   . Morbid obesity ([place]   . Stroke ([facility]    "mini stroke"    Family history- Reviewed and unchanged   Social history- Reviewed and unchanged  Due for Eye exam, to  schedule after COVID19 restrictions  Due for Dental exam, to schedule after COVID19 restrictions  Screening Tests: Immunization History  Administered Date(s) Administered  . Hepatitis A 07/11/2008, 05/30/2009  . Hepatitis B 08/23/1997, 01/24/1998, 07/19/1998  . IPV 07/21/2008  . MMR 08/23/1997  . Td 01/24/1998, 10/26/2018  . Tdap 07/11/2008  . Typhoid Live 07/11/2008     Vaccinations: TD or Tdap: 10/2018  Influenza: Declined Pneumococcal: N/A Prevnar13: N/A Shingles/Zostavax: N/A   Preventative Care: Last colonoscopy:N/A    Review of Systems:  Review of Systems  Constitutional: Negative for chills, diaphoresis, fever, malaise/fatigue and weight loss.  HENT: Negative for congestion, ear discharge, ear pain, hearing loss, nosebleeds,  sinus pain, sore throat and tinnitus.   Eyes: Negative for blurred vision, double vision, photophobia, pain, discharge and redness.  Respiratory: Negative for cough, hemoptysis, sputum production, shortness of breath, wheezing and stridor.   Cardiovascular: Negative for chest pain, palpitations, orthopnea, claudication, leg swelling and PND.  Gastrointestinal: Negative for abdominal pain, blood in stool, constipation, diarrhea, heartburn, melena, nausea and vomiting.  Genitourinary: Negative for dysuria, flank pain, frequency, hematuria and urgency.  Musculoskeletal: Negative for back pain, falls, joint pain, myalgias and neck pain.  Skin: Negative for itching and rash.  Neurological: Negative for dizziness, tingling, tremors, sensory change, speech change, focal weakness, seizures, loss of consciousness, weakness and headaches.  Endo/Heme/Allergies: Negative for environmental allergies and polydipsia. Does not bruise/bleed easily.  Psychiatric/Behavioral: Negative for depression, hallucinations, memory loss, substance abuse and suicidal ideas. The patient is not nervous/anxious and does not have insomnia.       Physical Exam: Wt 298 lb (135.2 kg)   BMI 39.32 kg/m  Wt Readings from Last 3 Encounters:  02/02/19 298 lb (135.2 kg)  01/25/19 (!) 301 lb (136.5 kg)  11/02/18 (!) 308 lb (139.7 kg)    General : Well sounding patient in no apparent distress HEENT: no hoarseness, no cough for duration of visit Lungs: speaks in complete sentences, no audible wheezing, no apparent distress Neurological: alert, oriented x 3 Psychiatric: pleasant, judgement appropriate**   Garnet Sierras, NP [facility] 4:30 PM

## 2019-02-03 ENCOUNTER — Other Ambulatory Visit: Payer: Self-pay

## 2019-02-04 ENCOUNTER — Encounter: Payer: Self-pay | Admitting: Adult Health Nurse Practitioner

## 2019-02-04 NOTE — Patient Instructions (Addendum)
Today you had a telephone visit with Elder NegusKyra Zareth Rippetoe, DNP.  Below is a summary of your visit.   Continue to work on diet modifications and exercise.  Good work already with a few pounds of weight loss.  Small changes make a large impact.  Do not try to change everything at once.  Think about replacing foods that are not healthy with a healthier option.  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   MyPlate from USDA  MyPlate is an outline of a general healthy diet based on the 2010 Dietary Guidelines for Americans, from the U.S. Department of Agriculture Architect(USDA). It sets guidelines for how much food you should eat from each food group based on your age, sex, and level of physical activity. What are tips for following MyPlate? To follow MyPlate recommendations:  Eat a wide variety of fruits and vegetables, grains, and protein foods.  Serve smaller portions and eat less food throughout the day.  Limit portion sizes to avoid overeating.  Enjoy your food.  Get at least 150 minutes of exercise every week. This is about 30 minutes each day, 5 or more days per week. It can be difficult to have every meal look like MyPlate. Think about MyPlate as eating guidelines for an entire day, rather than each individual meal. Fruits and vegetables  Make half of your plate fruits and vegetables.  Eat many different colors of fruits and vegetables each day.  For a 2,000 calorie daily food plan, eat: ? 2 cups of vegetables every day. ? 2 cups of fruit every day.  1 cup is equal to: ? 1 cup raw or cooked vegetables. ? 1 cup raw fruit. ? 1 medium-sized orange, apple, or banana. ? 1 cup 100% fruit or vegetable juice. ? 2 cups raw leafy greens, such as lettuce, spinach, or kale. ?  cup dried fruit. Grains  One fourth of your plate should be grains.  Make at least half of the grains you eat each day whole grains.  For a 2,000 calorie daily food plan, eat 6 oz of grains every day.  1  oz is equal to: ? 1 slice bread. ? 1 cup cereal. ?  cup cooked rice, cereal, or pasta. Protein  One fourth of your plate should be protein.  Eat a wide variety of protein foods, including meat, poultry, fish, eggs, beans, nuts, and tofu.  For a 2,000 calorie daily food plan, eat 5 oz of protein every day.  1 oz is equal to: ? 1 oz meat, poultry, or fish. ?  cup cooked beans. ? 1 egg. ?  oz nuts or seeds. ? 1 Tbsp peanut butter. Dairy  Drink fat-free or low-fat (1%) milk.  Eat or drink dairy as a side to meals.  For a 2,000 calorie daily food plan, eat or drink 3 cups of dairy every day.  1 cup is equal to: ? 1 cup milk, yogurt, cottage cheese, or soy milk (soy beverage). ? 2 oz processed cheese. ? 1 oz natural cheese. Fats, oils, salt, and sugars  Only small amounts of oils are recommended.  Avoid foods that are high in calories and low in nutritional value (empty calories), like foods high in fat or added sugars.  Choose foods that are low in salt (sodium). Choose foods that have less than 140 milligrams (mg) of sodium per serving.  Drink water instead of sugary drinks. Drink enough water each day to keep your urine pale yellow. Where to  find support  Work with your health care provider or a nutrition specialist (dietitian) to develop a customized eating plan that is right for you.  Download an app (mobile application) to help you track your daily food intake. Where to find more information  Go to https://www.bernard.org/ for more information.  Learn more and log your daily food intake according to MyPlate using USDA's SuperTracker: www.supertracker.usda.gov Summary  MyPlate is a general guideline for healthy eating from the USDA. It is based on the 2010 Dietary Guidelines for Americans.  In general, fruits and vegetables should take up  of your plate, grains should take up  of your plate, and protein should take up  of your plate. This information is not  intended to replace advice given to you by your health care provider. Make sure you discuss any questions you have with your health care provider. Document Released: 10/13/2007 Document Revised: 12/23/2016 Document Reviewed: 12/23/2016 Elsevier Interactive Patient Education  2019 [employer].   >>>>>>>>>>>>>>>>>>>>>>>>>>>>>>>>>>>>>>>>>>>>>>>>>>>>>>> Coronavirus (COVID-19) Are you at risk?  Are you at risk for the Coronavirus (COVID-19)?  To be considered HIGH RISK for Coronavirus (COVID-19), you have to meet the following criteria:  . Traveled to [place], [place], [place], [place] or [place]; or in the [place] to [place], [place], [place]  . or [place]; and have fever, cough, and shortness of breath within the last 2 weeks of travel OR . Been in close contact with a person diagnosed with COVID-19 within the last 2 weeks and have  . fever, cough,and shortness of breath .  . IF YOU DO NOT MEET THESE CRITERIA, YOU ARE CONSIDERED LOW RISK FOR COVID-19.  What to do if you are HIGH RISK for COVID-19?  Marland Kitchen If you are having a medical emergency, call 911. . Seek medical care right away. Before you go to a doctor's office, urgent care or emergency department, .  call ahead and tell them about your recent travel, contact with someone diagnosed with COVID-19  .  and your symptoms.  . You should receive instructions from your physician's office regarding next steps of care.  . When you arrive at healthcare provider, tell the healthcare staff immediately you have returned from  . visiting [place], [place], [place], [place] or [place]; or traveled in the [place] to [place], [place],  . [place] or [place] in the last two weeks or you have been in close contact with a person diagnosed with  . COVID-19 in the last 2 weeks.   . Tell the health care staff about your symptoms: fever, cough and shortness of breath. . After you have been seen by a medical provider, you will be  either: o Tested for (COVID-19) and discharged home on quarantine except to seek medical care if  o symptoms worsen, and asked to  - Stay home and avoid contact with others until you get your results (4-5 days)  - Avoid travel on public transportation if possible (such as bus, train, or airplane) or o Sent to the Emergency Department by EMS for evaluation, COVID-19 testing  and  o possible admission depending on your condition and test results.  What to do if you are LOW RISK for COVID-19?  Reduce your risk of any infection by using the same precautions used for avoiding the common cold or flu:  Marland Kitchen Wash your hands often with soap and warm water for at least 20 seconds.  If soap and water are not readily available,  .  use an alcohol-based hand sanitizer with at least 60% alcohol.  . If coughing or sneezing, cover your mouth and nose by coughing or sneezing into the elbow areas of your shirt or coat, .  into a tissue or into your sleeve (not your hands). . Avoid shaking hands with others and consider head nods or verbal greetings only. . Avoid touching your eyes, nose, or mouth with unwashed hands.  . Avoid close contact with people who are sick. . Avoid places or events with large numbers of people in one location, like concerts or sporting events. . Carefully consider travel plans you have or are making. . If you are planning any travel outside or inside the [place], visit the CDC's Travelers' Health webpage for the latest health notices. . If you have some symptoms but not all symptoms, continue to monitor at home and seek medical attention  . if your symptoms worsen. . If you are having a medical emergency, call 911.   .++++++++++++++++++ Recommend the book "The END of DIETING" by Dr Monico Hoar  & the book "The END of DIABETES " by Dr Monico Hoar At [email or web address] - get book & Audio CD's    Being diabetic has a  300% increased risk for heart attack, stroke, cancer, and alzheimer- type  vascular dementia. It is very important that you work harder with diet by avoiding all foods that are white. Avoid white rice (brown & wild rice is OK), white potatoes (sweetpotatoes in moderation is OK), White bread or wheat bread or anything made out of white flour like bagels, donuts, rolls, buns, biscuits, cakes, pastries, cookies, pizza crust, and pasta (made from white flour & egg whites) - vegetarian pasta or spinach or wheat pasta is OK. Multigrain breads like Arnold's or Pepperidge Farm, or multigrain sandwich thins or flatbreads.  Diet, exercise and weight loss can reverse and cure diabetes in the early stages.  Diet, exercise and weight loss is very important in the control and prevention of complications of diabetes which affects every system in your body, ie. Brain - dementia/stroke, eyes - glaucoma/blindness, heart - heart attack/heart failure, kidneys - dialysis, stomach - gastric paralysis, intestines - malabsorption, nerves - severe painful neuritis, circulation - gangrene & loss of a leg(s), and finally cancer and Alzheimers.    I recommend avoid fried & greasy foods,  sweets/candy, white rice (brown or wild rice or Quinoa is OK), white potatoes (sweet potatoes are OK) - anything made from white flour - bagels, doughnuts, rolls, buns, biscuits,white and wheat breads, pizza crust and traditional pasta made of white flour & egg white(vegetarian pasta or spinach or wheat pasta is OK).  Multi-grain bread is OK - like multi-grain flat bread or sandwich thins. Avoid alcohol in excess. Exercise is also important.    Eat all the vegetables you want - avoid meat, especially red meat and dairy - especially cheese.  Cheese is the most concentrated form of trans-fats which is the worst thing to clog up our arteries. Veggie cheese is OK which can be found in the fresh produce section at [employer] or [employer] or [employer]  +++++++++++++++++++ DASH Eating Plan  DASH stands for "Dietary Approaches  to Stop Hypertension."   The DASH eating plan is a healthy eating plan that has been shown to reduce high blood pressure (hypertension). Additional health benefits may include reducing the risk of type 2 diabetes mellitus, heart disease, and stroke. The DASH eating plan may also help with weight loss. WHAT DO  I NEED TO KNOW ABOUT THE DASH EATING PLAN? For the DASH eating plan, you will follow these general guidelines:  Choose foods with a percent daily value for sodium of less than 5% (as listed on the food label).  Use salt-free seasonings or herbs instead of table salt or sea salt.  Check with your health care provider or pharmacist before using salt substitutes.  Eat lower-sodium products, often labeled as "lower sodium" or "no salt added."  Eat fresh foods.  Eat more vegetables, fruits, and low-fat dairy products.  Choose whole grains. Look for the word "whole" as the first word in the ingredient list.  Choose fish   Limit sweets, desserts, sugars, and sugary drinks.  Choose heart-healthy fats.  Eat veggie cheese   Eat more home-cooked food and less restaurant, buffet, and fast food.  Limit fried foods.  Cook foods using methods other than frying.  Limit canned vegetables. If you do use them, rinse them well to decrease the sodium.  When eating at a restaurant, ask that your food be prepared with less salt, or no salt if possible.                      WHAT FOODS CAN I EAT? Read Dr Francis Dowse Fuhrman's books on The End of Dieting & The End of Diabetes  Grains Whole grain or whole wheat bread. Brown rice. Whole grain or whole wheat pasta. Quinoa, bulgur, and whole grain cereals. Low-sodium cereals. Corn or whole wheat flour tortillas. Whole grain cornbread. Whole grain crackers. Low-sodium crackers.  Vegetables Fresh or frozen vegetables (raw, steamed, roasted, or grilled). Low-sodium or reduced-sodium tomato and vegetable juices. Low-sodium or reduced-sodium tomato sauce and  paste. Low-sodium or reduced-sodium canned vegetables.   Fruits All fresh, canned (in natural juice), or frozen fruits.  Protein Products  All fish and seafood.  Dried beans, peas, or lentils. Unsalted nuts and seeds. Unsalted canned beans.  Dairy Low-fat dairy products, such as skim or 1% milk, 2% or reduced-fat cheeses, low-fat ricotta or cottage cheese, or plain low-fat yogurt. Low-sodium or reduced-sodium cheeses.  Fats and Oils Tub margarines without trans fats. Light or reduced-fat mayonnaise and salad dressings (reduced sodium). Avocado. Safflower, olive, or canola oils. Natural peanut or almond butter.  Other Unsalted popcorn and pretzels. The items listed above may not be a complete list of recommended foods or beverages. Contact your dietitian for more options.  +++++++++++++++  WHAT FOODS ARE NOT RECOMMENDED? Grains/ White flour or wheat flour White bread. White pasta. White rice. Refined cornbread. Bagels and croissants. Crackers that contain trans fat.  Vegetables  Creamed or fried vegetables. Vegetables in a . Regular canned vegetables. Regular canned tomato sauce and paste. Regular tomato and vegetable juices.  Fruits Dried fruits. Canned fruit in light or heavy syrup. Fruit juice.  Meat and Other Protein Products Meat in general - RED meat & White meat.  Fatty cuts of meat. Ribs, chicken wings, all processed meats as bacon, sausage, bologna, salami, fatback, hot dogs, bratwurst and packaged luncheon meats.  Dairy Whole or 2% milk, cream, half-and-half, and cream cheese. Whole-fat or sweetened yogurt. Full-fat cheeses or blue cheese. Non-dairy creamers and whipped toppings. Processed cheese, cheese spreads, or cheese curds.  Condiments Onion and garlic salt, seasoned salt, table salt, and sea salt. Canned and packaged gravies. Worcestershire sauce. Tartar sauce. Barbecue sauce. Teriyaki sauce. Soy sauce, including reduced sodium. Steak sauce. Fish sauce. Oyster  sauce. Cocktail sauce. Horseradish. Ketchup and mustard. Meat  flavorings and tenderizers. Bouillon cubes. Hot sauce. Tabasco sauce. Marinades. Taco seasonings. Relishes.  Fats and Oils Butter, stick margarine, lard, shortening and bacon fat. Coconut, palm kernel, or palm oils. Regular salad dressings.  Pickles and olives. Salted popcorn and pretzels.  The items listed above may not be a complete list of foods and beverages to avoid.

## 2019-02-08 ENCOUNTER — Encounter: Payer: Self-pay | Admitting: Internal Medicine

## 2019-02-13 ENCOUNTER — Other Ambulatory Visit: Payer: Self-pay | Admitting: Adult Health

## 2019-03-12 ENCOUNTER — Other Ambulatory Visit: Payer: Self-pay | Admitting: Adult Health

## 2019-03-13 ENCOUNTER — Other Ambulatory Visit: Payer: Self-pay | Admitting: Physician Assistant

## 2019-03-13 MED ORDER — PREDNISONE 20 MG PO TABS: ORAL_TABLET | ORAL | 0 refills | Status: DC

## 2019-03-24 ENCOUNTER — Telehealth: Payer: Self-pay

## 2019-03-24 MED ORDER — METFORMIN HCL 500 MG PO TABS: ORAL_TABLET | ORAL | 0 refills | Status: DC

## 2019-03-24 MED ORDER — LISINOPRIL 20 MG PO TABS: 20.0000 mg | ORAL_TABLET | Freq: Every day | ORAL | 1 refills | Status: DC

## 2019-03-24 MED ORDER — FLUTICASONE PROPIONATE 50 MCG/ACT NA SUSP: NASAL | 3 refills | Status: DC

## 2019-03-24 NOTE — Telephone Encounter (Signed)
Refill request for Metformin, Lisinopril and Flonase.

## 2019-03-29 ENCOUNTER — Other Ambulatory Visit: Payer: Self-pay | Admitting: Adult Health

## 2019-03-29 MED ORDER — FAMOTIDINE 20 MG PO TABS: 20.0000 mg | ORAL_TABLET | Freq: Two times a day (BID) | ORAL | 1 refills | Status: DC | PRN

## 2019-04-05 ENCOUNTER — Ambulatory Visit: Payer: BLUE CROSS/BLUE SHIELD | Admitting: Adult Health

## 2019-04-05 ENCOUNTER — Telehealth: Payer: Self-pay | Admitting: Neurology

## 2019-04-05 ENCOUNTER — Other Ambulatory Visit: Payer: Self-pay

## 2019-04-05 ENCOUNTER — Encounter: Payer: Self-pay | Admitting: Adult Health

## 2019-04-05 VITALS — BP 124/82 | HR 77 | Temp 97.5°F | Ht 73.0 in | Wt 314.0 lb

## 2019-04-05 DIAGNOSIS — I10 Essential (primary) hypertension: Secondary | ICD-10-CM

## 2019-04-05 DIAGNOSIS — Z8673 Personal history of transient ischemic attack (TIA), and cerebral infarction without residual deficits: Secondary | ICD-10-CM | POA: Diagnosis not present

## 2019-04-05 NOTE — Telephone Encounter (Signed)
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  CB# [phone]

## 2019-04-05 NOTE — Progress Notes (Signed)
Assessment and Plan:  Jared Ramsey was seen today for letter for school/work.  Diagnoses and all orders for this visit:  Hypertension, unspecified type At goal; continue medication Monitor blood pressure at home; call if consistently over 130/80 Continue DASH diet.   Reminder to go to the ER if any CP, SOB, nausea, dizziness, severe HA, changes vision/speech, left arm numbness and tingling and jaw pain.  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 Continue with lifestyle modification and risk factor reduction. Follow up Dr. Pearlean BrownieSethi as recommended.   Further disposition pending results of labs. Discussed med's effects and SE's.   Over 30 minutes of exam, counseling, chart review, and critical decision making was performed.   Future Appointments  Date Time Provider Department Center  04/26/2019  2:30 PM Judd Gaudierorbett, Chany Woolworth, NP [facility] None  07/27/2019  2:00 PM Judd Gaudierorbett, Seymone Forlenza, NP [facility] None  11/02/2019  4:00 PM Micki RileySethi, Pramod S, MD [facility] None    ------------------------------------------------------------------------------------------------------------------   HPI BP 124/82   Pulse 77   Temp (!) 97.5 F (36.4 C)   Ht 6\' 1"  (1.854 m)   Wt (!) 314 lb (142.4 kg)   SpO2 97%   BMI 41.43 kg/m   33 y.o.male left hand dominant, works as Airline pilotcommercial driver, with hx of [id number], htn, hyperlipidemia, morbid obsity and CVA/TIA attributed to reversible cerebral vasoconstriction syndrome presents for paperwork for short term disability.   He had an episode of left hemispheric TIA in  October 2019 likely from vasoconstriction from Sudafed use.  Prior history of likely right cerebellar infarct in 2017 secondary to cerebral reversible vasoconstriction syndrome while using phentermine and postcoital headaches.  Both episodes apparently happened in the setting of using vasoactive medications.  Multiple vascular risk factors of obesity, diabetes, hypertension, hyperlipidemia. He was evaluated by Dr. Marlowe ShoresPromod Sethi, He had lab work on 07/30/2018 which were all negative for hypercoagulable panel and vasculitis.  Transcranial Doppler bubble study on 08/25/2018 was negative for PFO.  Polysomnogram study on 08/27/2018 was negative for obstructive sleep apnea. He has been advised no further vasoactive medications (cough/cold medications, triptans) and is working on lifestyle modification for risk factors and initiated on medications as indicated by current guidelines.   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   His blood pressure has been controlled at home, today their BP is BP: 124/82  He does workout, walking daily with daughter. He denies chest pain, shortness of breath, dizziness.  BMI is Body mass index is 41.43 kg/m., he reports gained weight initially with covid 19 but has been working on diet and exercise and optimistic about progress in upcoming months.  Wt Readings from Last 3 Encounters:  04/05/19 (!) 314 lb (142.4 kg)  02/02/19 298 lb (135.2 kg)  01/25/19 (!) 301 lb (136.5 kg)     Past Medical History:  Diagnosis Date  . Diabetes mellitus without complication (HCC)   . Hypertension   . Morbid obesity (HCC)   . Stroke [facility](HCC)    "  mini stroke"     Allergies  Allergen Reactions  . Codeine Swelling    Current Outpatient Medications on File Prior to Visit  Medication Sig  . aspirin 81 MG chewable tablet Chew 81 mg  by mouth daily.   . Continuous Blood Gluc Sensor (FREESTYLE LIBRE SENSOR SYSTEM) MISC Check sugars 3 x a day DX E10.9  . famotidine (PEPCID) 20 MG tablet Take 1 tablet (20 mg total) by mouth 2 (two) times daily as needed.  . fluticasone (FLONASE) 50 MCG/ACT nasal spray Use 2 sprays each nostril 2 x  /Day  . glucose blood (FREESTYLE LITE) test strip Test sugar once daily  . lisinopril (ZESTRIL) 20 MG tablet Take 1 tablet (20 mg total) by mouth daily.  Marland Kitchen loratadine (CLARITIN) 10 MG tablet Take 10 mg by mouth daily as needed.   . metFORMIN (GLUCOPHAGE) 500 MG tablet TAKE TWO TABLETS BY MOUTH WITH LARGEST MEAL OF THE DAY AND TAKE ONE TABLET WITH EACH OTHER MEAL FOR A TOTAL OF 4 DAILY  . rosuvastatin (CRESTOR) 40 MG tablet Take 1 tablet (40 mg total) by mouth daily.  Marland Kitchen omeprazole (PRILOSEC) 40 MG capsule Take 1 capsule Daily for Indigestion & Heartburn  . predniSONE (DELTASONE) 20 MG tablet 2 tablets daily for 3 days, 1 tablet daily for 4 days, 1/2 tablet for 6 days if needed for contact dermatitis   No current facility-administered medications on file prior to visit.     ROS: all negative except above.   Physical Exam:  BP 124/82   Pulse 77   Temp (!) 97.5 F (36.4 C)   Ht 6\' 1"  (1.854 m)   Wt (!) 314 lb (142.4 kg)   SpO2 97%   BMI 41.43 kg/m   General Appearance: Well nourished, obese, in no apparent distress. Eyes: PERRLA, EOMs, conjunctiva no swelling or erythema ENT/Mouth: Ext aud canals clear, TMs without erythema, bulging. No erythema, swelling, or exudate on post pharynx.  Hearing normal.  Neck: Supple, thyroid normal.  Respiratory: Respiratory effort normal, BS equal bilaterally without rales, rhonchi, wheezing or stridor.  Cardio: RRR with no MRGs. Brisk peripheral pulses without edema.  Abdomen: Soft, + BS.  Non tender, no guarding, rebound, hernias, masses. Lymphatics: Non tender without lymphadenopathy.  Musculoskeletal: Full ROM, 5/5 strength, normal gait.  Skin: Warm,  dry without rashes, lesions, ecchymosis.  Neuro: Cranial nerves intact. Normal muscle tone, no cerebellar symptoms. Sensation intact.  Psych: Awake and oriented X 3, normal affect, Insight and Judgment appropriate.     Izora Ribas, NP 5:13 PM [facility]

## 2019-04-06 NOTE — Telephone Encounter (Signed)
I returned patient's call.  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  He has turned in disability paperwork to my office and wants me to state so while filling out the paperwork.

## 2019-04-12 ENCOUNTER — Telehealth: Payer: Self-pay | Admitting: *Deleted

## 2019-04-12 NOTE — Telephone Encounter (Signed)
I faxed pt form to short term disability on 04/12/19.

## 2019-04-12 NOTE — Telephone Encounter (Signed)
Disability paperwork completed patient paid fee for it. 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 Pt had a TIA in 07/2018 and if he is stroke free he can be cleared 07/2019. Form done till 07/14/2019.Form given to medical records to fax.

## 2019-04-22 NOTE — Progress Notes (Signed)
FOLLOW UP  Assessment and Plan:    Hypertension, unspecified type Continue medication-lisinopril 20 mg daily for BP goal 130/80 Monitor blood pressure at home; call if consistently over 130/80 Continue DASH diet.   Reminder to go to the ER if any CP, SOB, nausea, dizziness, severe HA, changes vision/speech, left arm numbness and tingling and jaw pain. -     COMPLETE METABOLIC PANEL WITH GFR -     Magnesium  Type 2 diabetes mellitus with other specified complication, without long-term current use of insulin [facility]) Education: Reviewed 'ABCs' of diabetes management (respective goals in parentheses):  A1C (<7), blood pressure (<130/80), and cholesterol (LDL <70) Eye Exam yearly and Dental Exam every 6 months. Dietary recommendations Physical Activity recommendations He is doing well with 2000 mg daily metformin; strongly motivated to work on lifestyle and we are willing to work with this as long as we continue to see progress with lifestyle modification and weight loss going forward Discussed ozempic for weight loss and glucose; he is agreeable - discs used, 6 week starter pack provided, titrate up to 1 mg weekly dose; will with any concerns with SE -     Hemoglobin A1c   History of CVA (cerebrovascular accident) Avoid vasocontrictive meds- reviewed these with patient today; continue ASA;  Control blood pressure, cholesterol, glucose, increase exercise.  -     Lipid panel  Morbid obesity ([place]) Long discussion about weight loss, diet, and exercise Recommended diet heavy in fruits and veggies and low in animal meats, cheeses, and dairy products, appropriate calorie intake Patient will work on cutting down on portions, avoiding binge eating/drinking Discussed appropriate weight for height and initial goal (275lb) - he is confident he can achieve this in the next 3-6 months Follow up at next visit  Former smoker Has quit smoking Strategies for ongoing success discussed  Hyperlipidemia  associated with type 2 diabetes mellitus ([place]) Continue medications: rosuvastatin 40 mg daily  Continue low cholesterol diet and exercise.  Check lipid panel.  -     Lipid panel -     TSH   Continue diet and meds as discussed. Further disposition pending results of labs. Discussed med's effects and SE's.   Over 30 minutes of exam, counseling, chart review, and critical decision making was performed.   Future Appointments  Date Time Provider [place]  07/27/2019  2:00 PM Liane Comber, NP [facility] None  11/02/2019  4:00 PM Garvin Fila, MD [facility] None    ----------------------------------------------------------------------------------------------------------------------  HPI 33 y.o. male  presents for 3 month follow up on hypertension, cholesterol, diabetes, morbid obesity and vitamin D deficiency.   Last year in 07/2018 he was evaluated in ED for right sided headache and extremity heaviness. CTA head showed Subtle low-density in the superior paramedian right cerebellum, compared to MRI in 2017.  He was evaluated by Dr. Leonie Man and concluded episode of left hemispheric TIA in October 2019 likely from vasoconstriction from Sudafed use.  Prior history of likely right cerebellar infarct in 2017 secondary to cerebral reversible vasoconstriction syndrome. He was recommended aggressive lifestyle modification and avoidance of vasoconstrictive agents, sudafed, phentermiene.   BMI is Body mass index is 41.77 kg/m., he admits lifestyle has been poor, has had very stressful month, weight was up to 323 lb on home scale just a few weeks ago. 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 He is trying to follow a "meat and veggies" diet, also taking metamucil.  He has been monitoring steps, walked 6 miles today, does either really well or not at all.  Alcohol -  limiting binging, ~3 per sitting, 6 pack per average week  No soda, only coffee and water Wt Readings from Last 3 Encounters:  04/26/19 (!) 316 lb 9.6 oz (143.6 kg)  04/05/19 (!) 314 lb (142.4 kg)  02/02/19 298 lb (135.2 kg)   His blood pressure has been controlled at home (130/80s), today their BP is BP: 134/78  He does workout. He denies chest pain, shortness of breath, dizziness.   He is on cholesterol medication Rosuvastatin 40 mg daily and denies myalgias. His cholesterol is not at goal. The cholesterol last visit was:   Lab Results  Component Value Date   CHOL 134 10/26/2018   HDL 41 10/26/2018   LDLCALC 76 10/26/2018   TRIG 83 10/26/2018   CHOLHDL 3.3 10/26/2018    He has been working on diet and exercise for T2DM newly on metformin 2000 mg daily this past year, working aggressively on lifestyle per patient preference, and denies increased appetite, nausea, paresthesia of the feet, polydipsia, polyuria, visual disturbances and vomiting. He reports fasting values down from 180 to 120-150. [facility] and was 8.7%. Last in the office was:  Lab Results  Component Value Date   HGBA1C 7.7 (H) 10/26/2018      Current Medications:  Current Outpatient Medications on File Prior to Visit  Medication Sig  . aspirin 81 MG chewable tablet Chew 81 mg by mouth daily.   . famotidine (PEPCID) 20 MG tablet Take 1 tablet (20 mg total) by mouth 2 (two) times daily as needed.  . fluticasone (FLONASE) 50 MCG/ACT nasal spray Use 2 sprays each nostril 2 x  /Day  . lisinopril (ZESTRIL) 20 MG tablet Take 1 tablet (20 mg total) by mouth daily.  . metFORMIN (GLUCOPHAGE) 500 MG tablet TAKE TWO TABLETS BY MOUTH WITH LARGEST MEAL OF THE DAY AND TAKE ONE TABLET WITH EACH OTHER MEAL FOR A TOTAL OF 4 DAILY  . rosuvastatin (CRESTOR) 40 MG tablet Take 1 tablet (40 mg total) by mouth daily.  . Continuous Blood Gluc Sensor (FREESTYLE LIBRE SENSOR SYSTEM) MISC Check sugars 3 x a day DX E10.9  (Patient not taking: Reported on 04/26/2019)  . glucose blood (FREESTYLE LITE) test strip Test sugar once daily  . loratadine (CLARITIN) 10 MG tablet Take 10 mg by mouth daily as needed.    No current facility-administered medications on file prior to visit.      Allergies:  Allergies  Allergen Reactions  . Codeine Swelling     Medical History:  Past Medical History:  Diagnosis Date  . Diabetes mellitus without complication (HCC)   . Hypertension   . Morbid obesity (HCC)   . Stroke [facility](HCC)    "mini stroke"   Family history- Reviewed and unchanged Social history- Reviewed and unchanged   Review of Systems:  Review of Systems  Constitutional: Negative for malaise/fatigue and weight loss.  HENT: Negative for hearing loss and tinnitus.   Eyes: Negative for blurred vision and double vision.  Respiratory: Negative for cough, shortness of breath and wheezing.   Cardiovascular: Negative for chest pain, palpitations, orthopnea, claudication and leg swelling.  Gastrointestinal: Negative for abdominal pain, blood in stool, constipation, diarrhea, heartburn, melena, nausea and vomiting.  Genitourinary: Negative.  Musculoskeletal: Negative for joint pain and myalgias.  Skin: Negative for rash.  Neurological: Negative for dizziness, tingling, sensory change, weakness and headaches.  Endo/Heme/Allergies: Negative for polydipsia.  Psychiatric/Behavioral: Negative.   All other systems reviewed and are negative.     Physical Exam: BP 134/78   Pulse 92   Temp (!) 97.1 F (36.2 C)   Ht 6\' 1"  (1.854 m)   Wt (!) 316 lb 9.6 oz (143.6 kg)   SpO2 97%   BMI 41.77 kg/m  Wt Readings from Last 3 Encounters:  04/26/19 (!) 316 lb 9.6 oz (143.6 kg)  04/05/19 (!) 314 lb (142.4 kg)  02/02/19 298 lb (135.2 kg)   General Appearance: Well nourished, in no apparent distress. Eyes: PERRLA, EOMs, conjunctiva no swelling or erythema Sinuses: No Frontal/maxillary tenderness ENT/Mouth: Ext aud  canals clear, TMs without erythema, bulging. No erythema, swelling, or exudate on post pharynx.  Tonsils not swollen or erythematous. Hearing normal.  Neck: Supple, thyroid normal.  Respiratory: Respiratory effort normal, BS equal bilaterally without rales, rhonchi, wheezing or stridor.  Cardio: RRR with no MRGs. Brisk peripheral pulses without edema.  Abdomen: Soft, + BS.  Non tender, no guarding, rebound, hernias, masses. Lymphatics: Non tender without lymphadenopathy.  Musculoskeletal: Full ROM, 5/5 strength, Normal gait Skin: Warm, dry without rashes, lesions, ecchymosis.  Neuro: Cranial nerves intact. No cerebellar symptoms.  Psych: Awake and oriented X 3, normal affect, Insight and Judgment appropriate.    Dan MakerAshley C Robertson Colclough, NP 2:37 PM [facility]

## 2019-04-26 ENCOUNTER — Other Ambulatory Visit: Payer: Self-pay | Admitting: Adult Health

## 2019-04-26 ENCOUNTER — Other Ambulatory Visit: Payer: Self-pay

## 2019-04-26 ENCOUNTER — Encounter: Payer: Self-pay | Admitting: Adult Health

## 2019-04-26 ENCOUNTER — Ambulatory Visit: Payer: BC Managed Care – PPO | Admitting: Adult Health

## 2019-04-26 VITALS — BP 134/78 | HR 92 | Temp 97.1°F | Ht 73.0 in | Wt 316.6 lb

## 2019-04-26 DIAGNOSIS — E119 Type 2 diabetes mellitus without complications: Secondary | ICD-10-CM | POA: Diagnosis not present

## 2019-04-26 DIAGNOSIS — Z79899 Other long term (current) drug therapy: Secondary | ICD-10-CM

## 2019-04-26 DIAGNOSIS — E1169 Type 2 diabetes mellitus with other specified complication: Secondary | ICD-10-CM

## 2019-04-26 DIAGNOSIS — I10 Essential (primary) hypertension: Secondary | ICD-10-CM

## 2019-04-26 DIAGNOSIS — Z8673 Personal history of transient ischemic attack (TIA), and cerebral infarction without residual deficits: Secondary | ICD-10-CM

## 2019-04-26 DIAGNOSIS — F1010 Alcohol abuse, uncomplicated: Secondary | ICD-10-CM

## 2019-04-26 DIAGNOSIS — E785 Hyperlipidemia, unspecified: Secondary | ICD-10-CM

## 2019-04-26 MED ORDER — OZEMPIC (1 MG/DOSE) 2 MG/1.5ML ~~LOC~~ SOPN: 1.0000 mg | PEN_INJECTOR | SUBCUTANEOUS | 2 refills | Status: DC

## 2019-04-26 NOTE — Patient Instructions (Addendum)
Goals    . Blood Pressure < 130/80    . DIET - INCREASE WATER INTAKE     80-100 fluid ounces    . Exercise 150 min/wk Moderate Activity    . HEMOGLOBIN A1C < 7.0    . LDL CALC < 70    . Weight (lb) < 275 lb (124.7 kg)       Will start ozempic; 0.25 mg weekly x 4 weeks, then 0.5 mg weekly x 4 weeks, then final dose of 1 mg weekly thereafter (call when out of samples and will send in    Semaglutide injection solution What is this medicine? SEMAGLUTIDE (Sem a GLOO tide) is used to improve blood sugar control in adults with type 2 diabetes. This medicine may be used with other diabetes medicines. This drug may also reduce the risk of heart attack or stroke if you have type 2 diabetes and risk factors for heart disease. This medicine may be used for other purposes; ask your health care provider or pharmacist if you have questions. COMMON BRAND NAME(S): OZEMPIC What should I tell my health care provider before I take this medicine? They need to know if you have any of these conditions:  endocrine tumors (MEN 2) or if someone in your family had these tumors  eye disease, vision problems  history of pancreatitis  kidney disease  stomach problems  thyroid cancer or if someone in your family had thyroid cancer  an unusual or allergic reaction to semaglutide, other medicines, foods, dyes, or preservatives  pregnant or trying to get pregnant  breast-feeding How should I use this medicine? This medicine is for injection under the skin of your upper leg (thigh), stomach area, or upper arm. It is given once every week (every 7 days). You will be taught how to prepare and give this medicine. Use exactly as directed. Take your medicine at regular intervals. Do not take it more often than directed. If you use this medicine with insulin, you should inject this medicine and the insulin separately. Do not mix them together. Do not give the injections right next to each other. Change (rotate)  injection sites with each injection. It is important that you put your used needles and syringes in a special sharps container. Do not put them in a trash can. If you do not have a sharps container, call your pharmacist or healthcare provider to get one. A special MedGuide will be given to you by the pharmacist with each prescription and refill. Be sure to read this information carefully each time. Talk to your pediatrician regarding the use of this medicine in children. Special care may be needed. Overdosage: If you think you have taken too much of this medicine contact a poison control center or emergency room at once. NOTE: This medicine is only for you. Do not share this medicine with others. What if I miss a dose? If you miss a dose, take it as soon as you can within 5 days after the missed dose. Then take your next dose at your regular weekly time. If it has been longer than 5 days after the missed dose, do not take the missed dose. Take the next dose at your regular time. Do not take double or extra doses. If you have questions about a missed dose, contact your health care provider for advice. What may interact with this medicine?  other medicines for diabetes Many medications may cause changes in blood sugar, these include:  alcohol containing beverages  antiviral medicines for HIV or AIDS  aspirin and aspirin-like drugs  certain medicines for blood pressure, heart disease, irregular heart beat  chromium  diuretics  male hormones, such as estrogens or progestins, birth control pills  fenofibrate  gemfibrozil  isoniazid  lanreotide  male hormones or anabolic steroids  MAOIs like Carbex, Eldepryl, Marplan, Nardil, and Parnate  medicines for weight loss  medicines for allergies, asthma, cold, or cough  medicines for depression, anxiety, or psychotic disturbances  niacin  nicotine  NSAIDs, medicines for pain and inflammation, like ibuprofen or  naproxen  octreotide  pasireotide  pentamidine  phenytoin  probenecid  quinolone antibiotics such as ciprofloxacin, levofloxacin, ofloxacin  some herbal dietary supplements  steroid medicines such as prednisone or cortisone  sulfamethoxazole; trimethoprim  thyroid hormones Some medications can hide the warning symptoms of low blood sugar (hypoglycemia). You may need to monitor your blood sugar more closely if you are taking one of these medications. These include:  beta-blockers, often used for high blood pressure or heart problems (examples include atenolol, metoprolol, propranolol)  clonidine  guanethidine  reserpine This list may not describe all possible interactions. Give your health care provider a list of all the medicines, herbs, non-prescription drugs, or dietary supplements you use. Also tell them if you smoke, drink alcohol, or use illegal drugs. Some items may interact with your medicine. What should I watch for while using this medicine? Visit your doctor or health care professional for regular checks on your progress. Drink plenty of fluids while taking this medicine. Check with your doctor or health care professional if you get an attack of severe diarrhea, nausea, and vomiting. The loss of too much body fluid can make it dangerous for you to take this medicine. A test called the HbA1C (A1C) will be monitored. This is a simple blood test. It measures your blood sugar control over the last 2 to 3 months. You will receive this test every 3 to 6 months. Learn how to check your blood sugar. Learn the symptoms of low and high blood sugar and how to manage them. Always carry a quick-source of sugar with you in case you have symptoms of low blood sugar. Examples include hard sugar candy or glucose tablets. Make sure others know that you can choke if you eat or drink when you develop serious symptoms of low blood sugar, such as seizures or unconsciousness. They must get  medical help at once. Tell your doctor or health care professional if you have high blood sugar. You might need to change the dose of your medicine. If you are sick or exercising more than usual, you might need to change the dose of your medicine. Do not skip meals. Ask your doctor or health care professional if you should avoid alcohol. Many nonprescription cough and cold products contain sugar or alcohol. These can affect blood sugar. Pens should never be shared. Even if the needle is changed, sharing may result in passing of viruses like hepatitis or HIV. Wear a medical ID bracelet or chain, and carry a card that describes your disease and details of your medicine and dosage times. Do not become pregnant while taking this medicine. Women should inform their doctor if they wish to become pregnant or think they might be pregnant. There is a potential for serious side effects to an unborn child. Talk to your health care professional or pharmacist for more information. What side effects may I notice from receiving this medicine? Side effects that you should  report to your doctor or health care professional as soon as possible:  allergic reactions like skin rash, itching or hives, swelling of the face, lips, or tongue  breathing problems  changes in vision  diarrhea that continues or is severe  lump or swelling on the neck  severe nausea  signs and symptoms of infection like fever or chills; cough; sore throat; pain or trouble passing urine  signs and symptoms of low blood sugar such as feeling anxious, confusion, dizziness, increased hunger, unusually weak or tired, sweating, shakiness, cold, irritable, headache, blurred vision, fast heartbeat, loss of consciousness  signs and symptoms of kidney injury like trouble passing urine or change in the amount of urine  trouble swallowing  unusual stomach upset or pain  vomiting Side effects that usually do not require medical attention  (report to your doctor or health care professional if they continue or are bothersome):  constipation  diarrhea  nausea  pain, redness, or irritation at site where injected  stomach upset This list may not describe all possible side effects. Call your doctor for medical advice about side effects. You may report side effects to FDA at 1-800-FDA-1088. Where should I keep my medicine? Keep out of the reach of children. Store unopened pens in a refrigerator between 2 and 8 degrees C (36 and 46 degrees F). Do not freeze. Protect from light and heat. After you first use the pen, it can be stored for 56 days at room temperature between 15 and 30 degrees C (59 and 86 degrees F) or in a refrigerator. Throw away your used pen after 56 days or after the expiration date, whichever comes first. Do not store your pen with the needle attached. If the needle is left on, medicine may leak from the pen. NOTE: This sheet is a summary. It may not cover all possible information. If you have questions about this medicine, talk to your doctor, pharmacist, or health care provider.  2020 Elsevier/Gold Standard (2018-10-23 14:25:32)

## 2019-04-27 LAB — CBC WITH DIFFERENTIAL/PLATELET
Absolute Monocytes: 814 cells/uL (ref 200–950)
Basophils Absolute: 62 cells/uL (ref 0–200)
Basophils Relative: 0.6 %
Eosinophils Absolute: 196 cells/uL (ref 15–500)
Eosinophils Relative: 1.9 %
HCT: 44.1 % (ref 38.5–50.0)
Hemoglobin: 14.7 g/dL (ref 13.2–17.1)
Lymphs Abs: 2050 cells/uL (ref 850–3900)
MCH: 29.3 pg (ref 27.0–33.0)
MCHC: 33.3 g/dL (ref 32.0–36.0)
MCV: 87.8 fL (ref 80.0–100.0)
MPV: 9.3 fL (ref 7.5–12.5)
Monocytes Relative: 7.9 %
Neutro Abs: 7179 cells/uL (ref 1500–7800)
Neutrophils Relative %: 69.7 %
Platelets: 307 10*3/uL (ref 140–400)
RBC: 5.02 10*6/uL (ref 4.20–5.80)
RDW: 13.1 % (ref 11.0–15.0)
Total Lymphocyte: 19.9 %
WBC: 10.3 10*3/uL (ref 3.8–10.8)

## 2019-04-27 LAB — LIPID PANEL
Cholesterol: 175 mg/dL (ref <200)
HDL: 46 mg/dL (ref >=40)
LDL Cholesterol (Calc): 108 mg/dL (calc) — ABNORMAL HIGH
Non-HDL Cholesterol (Calc): 129 mg/dL (calc) (ref <130)
Total CHOL/HDL Ratio: 3.8 (calc) (ref <5.0)
Triglycerides: 113 mg/dL (ref <150)

## 2019-04-27 LAB — COMPLETE METABOLIC PANEL WITH GFR
AG Ratio: 1.9 (calc) (ref 1.0–2.5)
ALT: 43 U/L (ref 9–46)
AST: 22 U/L (ref 10–40)
Albumin: 5.1 g/dL (ref 3.6–5.1)
Alkaline phosphatase (APISO): 45 U/L (ref 36–130)
BUN: 23 mg/dL (ref 7–25)
CO2: 24 mmol/L (ref 20–32)
Calcium: 11 mg/dL — ABNORMAL HIGH (ref 8.6–10.3)
Chloride: 99 mmol/L (ref 98–110)
Creat: 1.02 mg/dL (ref 0.60–1.35)
GFR, Est African American: 111 mL/min/{1.73_m2} (ref >=60)
GFR, Est Non African American: 95 mL/min/{1.73_m2} (ref >=60)
Globulin: 2.7 g/dL (calc) (ref 1.9–3.7)
Glucose, Bld: 143 mg/dL — ABNORMAL HIGH (ref 65–99)
Potassium: 4.2 mmol/L (ref 3.5–5.3)
Sodium: 134 mmol/L — ABNORMAL LOW (ref 135–146)
Total Bilirubin: 0.8 mg/dL (ref 0.2–1.2)
Total Protein: 7.8 g/dL (ref 6.1–8.1)

## 2019-04-27 LAB — HEMOGLOBIN A1C
Hgb A1c MFr Bld: 7.6 % of total Hgb — ABNORMAL HIGH (ref <5.7)
Mean Plasma Glucose: 171 (calc)
eAG (mmol/L): 9.5 (calc)

## 2019-04-27 LAB — MAGNESIUM: Magnesium: 2 mg/dL (ref 1.5–2.5)

## 2019-04-27 LAB — TSH: TSH: 2.04 mIU/L (ref 0.40–4.50)

## 2019-05-06 ENCOUNTER — Other Ambulatory Visit: Payer: Self-pay | Admitting: Adult Health

## 2019-05-15 ENCOUNTER — Other Ambulatory Visit: Payer: Self-pay | Admitting: Adult Health

## 2019-06-21 ENCOUNTER — Other Ambulatory Visit: Payer: Self-pay | Admitting: Adult Health

## 2019-06-21 MED ORDER — FLUTICASONE PROPIONATE 50 MCG/ACT NA SUSP: NASAL | 3 refills | Status: DC

## 2019-06-21 MED ORDER — LISINOPRIL 20 MG PO TABS: 20.0000 mg | ORAL_TABLET | Freq: Every day | ORAL | 1 refills | Status: DC

## 2019-07-12 ENCOUNTER — Other Ambulatory Visit: Payer: Self-pay | Admitting: Adult Health

## 2019-07-12 MED ORDER — OMEPRAZOLE 40 MG PO CPDR: 40.0000 mg | DELAYED_RELEASE_CAPSULE | Freq: Every day | ORAL | 1 refills | Status: DC

## 2019-07-19 ENCOUNTER — Encounter: Payer: Self-pay | Admitting: Adult Health

## 2019-07-26 ENCOUNTER — Telehealth: Payer: Self-pay

## 2019-07-26 DIAGNOSIS — K219 Gastro-esophageal reflux disease without esophagitis: Secondary | ICD-10-CM | POA: Insufficient documentation

## 2019-07-26 DIAGNOSIS — Z0289 Encounter for other administrative examinations: Secondary | ICD-10-CM

## 2019-07-26 NOTE — Telephone Encounter (Signed)
I called pt about forms to be release back to work this month. 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 PT given appt and check in time, mask required and no kids.

## 2019-07-26 NOTE — Progress Notes (Signed)
Complete Physical  Assessment and Plan:  Doy was seen today for establish care.  Diagnoses and all orders for this visit:  Encounter for medical examination to establish care  Hypertension, unspecified type Continue medication- lisinopril 10 mg daily Monitor blood pressure at home; call if consistently over 130/80 Continue DASH diet.   Reminder to go to the ER if any CP, SOB, nausea, dizziness, severe HA, changes vision/speech, left arm numbness and tingling and jaw pain. -     CBC with Differential/Platelet -     COMPLETE METABOLIC PANEL WITH GFR -     Magnesium  Type 2 diabetes mellitus with other specified complication, without long-term current use of insulin [facility]) Education: Reviewed 'ABCs' of diabetes management (respective goals in parentheses):  A1C (<7), blood pressure (<130/80), and cholesterol (LDL <70) Eye Exam yearly and Dental Exam every 6 months. Dietary recommendations Physical Activity recommendations -     Hemoglobin A1c -     Urinalysis, Routine w reflex microscopic  History of CVA (cerebrovascular accident) Followed by neurology Dr. Leonie Man  Attributed to vasoactive medication; avoid vasoconstrictive agents, sudafed, phentermiene Continue ASA Control blood pressure, cholesterol, glucose, increase exercise.  -     Lipid panel  Morbid obesity ([place]) Long discussion about weight loss, diet, and exercise Recommended diet heavy in fruits and veggies and low in animal meats, cheeses, and dairy products, appropriate calorie intake Patient will work on cutting down on portions, avoiding binge eating/drinking Discussed appropriate weight for height and initial goal (275lb) Couldn't tolerate ozempic, pending labs consider bydurion Bcise  Follow up at next visit  Environmental allergies Continue OTC allergy pills  Alcohol consumption binge drinking Has stopped drinking- discussed avoiding binge drinking, max 1-2 drinks per sitting   Former smoker Has quit  smoking Strategies for ongoing success discussed  Right carotid atherosclerosis Control blood pressure, cholesterol, glucose, increase exercise.  -     Lipid panel  Hyperlipidemia associated with type 2 diabetes mellitus (HCC) Continue medications: rosuvastatin 40 mg daily LDL goal <70 Continue low cholesterol diet and exercise.  Check lipid panel.  -     Lipid panel -     TSH  GERD Well managed on current medications; he will attempt slow taper of PPI to avoid daily use if possible, then continue PRN  Discussed diet, avoiding triggers and other lifestyle changes   Discussed med's effects and SE's. Screening labs and tests as requested with regular follow-up as recommended. Over 40 minutes of exam, counseling, chart review and critical decision making was performed   Future Appointments  Date Time Provider [place]  07/27/2020  3:00 PM Liane Comber, NP [facility] None  09/05/2020 11:30 AM Garvin Fila, MD [facility] None    HPI BP 118/80   Pulse (!) 106   Temp (!) 97.2 F (36.2 C)   Ht 6' 0.5" (1.842 m)   Wt (!) 318 lb (144.2 kg)   SpO2 97%   BMI 42.54 kg/m   33 y.o. male patient presents for CPE. He has Hypertension; History of CVA (cerebrovascular accident); Morbid obesity ([place]); Environmental allergies; Alcohol consumption binge drinking; Former smoker (quit 07/2018, 7.5 pack year history); Stenosis of right carotid artery; Type 2 diabetes mellitus with circulatory disorder ([place]); Hyperlipidemia associated with type 2 diabetes mellitus (HCC); and Acid reflux on their problem list.   He is married, has 52 y/o daughter, he is a Games developer.  Wanting another child, trying  Last year in 07/2018 he was evaluated in ED for  right sided headache and extremity heaviness. CTA head showed Subtle low-density in the superior paramedian right cerebellum, compared to MRI in 2017. CTA showed Mild noncalcified plaque at the right common carotid bifurcationand left  vertebral origin. He was evaluated by Dr. Leonie Man and concluded episode of left hemispheric TIA in October 2019 likely from vasoconstriction from Sudafed use.  Prior history of likely right cerebellar infarct in 2017 secondary to cerebral reversible vasoconstriction syndrome. He was recommended aggressive lifestyle modification and avoidance of vasoconstrictive agents, sudafed, phentermiene. He was on disability due to stroke until recently but has been released to work with no suspected repeated CVA for 1 year.   History of binge drinking (12-24 on weekend), has decreased over past year, none since 06/2019  Former smoker; 7.5 pack year history, quit in 07/2018 and doing well.   Earlier this year he reported GI/gastritis/reflux symptoms which were improved with addition of H2i/PPI, PRN carafate, recently well controlled by famotidine BID and prilosec 40 mg daily PRN. He did have negative h. Pylori breath test prior to initiating medications.   BMI is Body mass index is 42.54 kg/m., he has been working on diet and exercise - trying to get out and walk more, going to the park, taking the stairs. Currently working odd jobs, physically intense, several hours 5 days a week. Weight goal 275 lb. Plans to start packing lunch when he restarts driving, and walking.  Wt Readings from Last 3 Encounters:  07/27/19 (!) 318 lb (144.2 kg)  07/27/19 (!) 319 lb (144.7 kg)  04/26/19 (!) 316 lb 9.6 oz (143.6 kg)   His blood pressure has been controlled at home, today their BP is BP: 118/80 He does workout. He denies chest pain, shortness of breath, dizziness.   He is on cholesterol medication (switched to rosuvastatin 40 mg daily) and denies myalgias.  His cholesterol is not at LDL goal of <70 though previously has been near goal with current agent. The cholesterol last visit was:   Lab Results  Component Value Date   CHOL 175 04/26/2019   HDL 46 04/26/2019   LDLCALC 108 (H) 04/26/2019   TRIG 113 04/26/2019    CHOLHDL 3.8 04/26/2019    He has been working on diet and exercise for T2DM with circulatory complication, treated by metformin 2000 mg daily, had nausea with ozempic at starting dose and was unable to tolerate, he is on bASA, he is on ACE/ARB and denies foot ulcerations, increased appetite, nausea, polydipsia, polyuria, visual disturbances and vomiting. Last A1C in the office was:  Lab Results  Component Value Date   HGBA1C 7.6 (H) 04/26/2019    Last GFR: Lab Results  Component Value Date   GFRNONAA 95 04/26/2019   No results found for: VD25OH      Current Medications:  Current Outpatient Medications on File Prior to Visit  Medication Sig Dispense Refill  . aspirin 81 MG chewable tablet Chew 81 mg by mouth daily.     . famotidine (PEPCID) 20 MG tablet Take 1 tablet (20 mg total) by mouth 2 (two) times daily as needed. 180 tablet 1  . fluticasone (FLONASE) 50 MCG/ACT nasal spray Use 2 sprays each nostril 2 x  /Day 48 g 3  . glucose blood (FREESTYLE LITE) test strip Test sugar once daily 50 each 12  . lisinopril (ZESTRIL) 20 MG tablet Take 1 tablet (20 mg total) by mouth daily. 90 tablet 1  . loratadine (CLARITIN) 10 MG tablet Take 10 mg by mouth daily  as needed.     . metFORMIN (GLUCOPHAGE) 500 MG tablet TAKE TWO TABLETS BY MOUTH WITH LARGEST MEAL OF THE DAY AND TAKE ONE TABLET WITH EACH OTHER MEAL FOR A TOTAL OF 4 DAILY 360 tablet 0  . omeprazole (PRILOSEC) 40 MG capsule Take 1 capsule (40 mg total) by mouth daily. 30 min prior to breakfast. 30 capsule 1  . rosuvastatin (CRESTOR) 40 MG tablet Take 1 tablet Daily for Cholesterol 90 tablet 1   No current facility-administered medications on file prior to visit.    Allergies:  Allergies  Allergen Reactions  . Codeine Swelling   Health Maintenance:  Immunization History  Administered Date(s) Administered  . Hepatitis A 07/11/2008, 05/30/2009  . Hepatitis B 08/23/1997, 01/24/1998, 07/19/1998  . IPV 07/21/2008  . MMR 08/23/1997   . Td 01/24/1998, 10/26/2018  . Tdap 07/11/2008  . Typhoid Live 07/11/2008    Tetanus: 2020 Pneumovax:  Flu vaccine: DUE HPV: declines  Sleep study: 08/2018 normal  CTA head/neck: 07/2018 Mild noncalcified plaque at the right common carotid bifurcation and left vertebral origin. There is related 50% narrowing at the left vertebral origin. MRI brain: 07/2018 Colonoscopy: n/a EGD: n/a  Eye Exam: Overdue, he will schedule diabetic eye  Dentist: [place], last visit 2020, goes q71m  Patient Care Team: MUnk Pinto MD as PCP - General (Internal Medicine) SGarvin Fila MD as Consulting Physician (Neurology)  Medical History:  has Hypertension; History of CVA (cerebrovascular accident); Morbid obesity ([place]; Environmental allergies; Alcohol consumption binge drinking; Former smoker (quit 07/2018, 7.5 pack year history); Stenosis of right carotid artery; Type 2 diabetes mellitus with circulatory disorder ([place]; Hyperlipidemia associated with type 2 diabetes mellitus ([place]; and Acid reflux on their problem list. Surgical History:  He  has a past surgical history that includes Tonsillectomy and Plantar's wart excision (Left, 2003). Family History:  His family history includes Alzheimer's disease in his maternal grandmother and paternal grandmother; Diabetes in his maternal grandmother, mother, and sister; Emphysema in his paternal grandmother; Epilepsy in his sister; Hyperlipidemia in his father and paternal grandfather; Hypertension in his father, maternal grandmother, mother, paternal grandfather, and sister; Lymphoma in his mother. Social History:   reports that he quit smoking about a year ago. His smoking use included cigarettes. He has a 7.50 pack-year smoking history. He has never used smokeless tobacco. He reports current alcohol use. He reports that he does not use drugs. Review of Systems:  Review of Systems  Constitutional: Negative for malaise/fatigue and weight loss.   HENT: Negative for hearing loss and tinnitus.   Eyes: Negative for blurred vision and double vision.  Respiratory: Negative for cough, shortness of breath and wheezing.   Cardiovascular: Negative for chest pain, palpitations, orthopnea, claudication and leg swelling.  Gastrointestinal: Negative for abdominal pain, blood in stool, constipation, diarrhea, heartburn, melena, nausea and vomiting.  Genitourinary: Negative.   Musculoskeletal: Negative for joint pain and myalgias.  Skin: Negative for rash.  Neurological: Negative for dizziness, tingling, sensory change, weakness and headaches.  Endo/Heme/Allergies: Negative for polydipsia.  Psychiatric/Behavioral: Negative for depression, memory loss and substance abuse. The patient is not nervous/anxious and does not have insomnia.   All other systems reviewed and are negative.   Physical Exam: Estimated body mass index is 42.54 kg/m as calculated from the following:   Height as of this encounter: 6' 0.5" (1.842 m).   Weight as of this encounter: 318 lb (144.2 kg). BP 118/80   Pulse (!) 106   Temp (!)  97.2 F (36.2 C)   Ht 6' 0.5" (1.842 m)   Wt (!) 318 lb (144.2 kg)   SpO2 97%   BMI 42.54 kg/m  General Appearance: Well nourished, in no apparent distress.  Eyes: PERRLA, EOMs, conjunctiva no swelling or erythema, fundal exam deferred to ophth Sinuses: No Frontal/maxillary tenderness  ENT/Mouth: Ext aud canals clear, normal light reflex with TMs without erythema, bulging. Good dentition. No erythema, swelling, or exudate on post pharynx. Tonsils not swollen or erythematous. Hearing normal.  Neck: Supple, thyroid normal. No bruits  Respiratory: Respiratory effort normal, BS equal bilaterally without rales, rhonchi, wheezing or stridor.  Cardio: RRR without murmurs, rubs or gallops. Brisk peripheral pulses without edema.  Chest: symmetric, with normal excursions and percussion.  Abdomen: Soft, mildly tender in RUQ, no guarding, rebound,  palpable hernias, masses, or organomegaly.  Lymphatics: Non tender without lymphadenopathy.  Genitourinary: Declines Musculoskeletal: Full ROM all peripheral extremities,5/5 strength, and normal gait.  Skin: Warm, dry without rashes, lesions, ecchymosis. Neuro: Cranial nerves intact, reflexes equal bilaterally. Normal muscle tone, no cerebellar symptoms. Sensation intact.  Psych: Awake and oriented X 3, normal affect, Insight and Judgment appropriate.   EKG: WNL in chart from 01/2019 reviewed; declines today   Izora Ribas 2:27 PM [facility]

## 2019-07-27 ENCOUNTER — Ambulatory Visit: Payer: BLUE CROSS/BLUE SHIELD | Admitting: Neurology

## 2019-07-27 ENCOUNTER — Other Ambulatory Visit: Payer: Self-pay

## 2019-07-27 ENCOUNTER — Encounter: Payer: Self-pay | Admitting: Adult Health

## 2019-07-27 ENCOUNTER — Encounter: Payer: Self-pay | Admitting: Neurology

## 2019-07-27 ENCOUNTER — Ambulatory Visit: Payer: BC Managed Care – PPO | Admitting: Adult Health

## 2019-07-27 VITALS — BP 118/80 | HR 106 | Temp 97.2°F | Ht 72.5 in | Wt 318.0 lb

## 2019-07-27 VITALS — BP 128/90 | HR 82 | Temp 97.8°F | Ht 73.0 in | Wt 319.0 lb

## 2019-07-27 DIAGNOSIS — E1169 Type 2 diabetes mellitus with other specified complication: Secondary | ICD-10-CM

## 2019-07-27 DIAGNOSIS — K219 Gastro-esophageal reflux disease without esophagitis: Secondary | ICD-10-CM

## 2019-07-27 DIAGNOSIS — Z79899 Other long term (current) drug therapy: Secondary | ICD-10-CM

## 2019-07-27 DIAGNOSIS — Z1329 Encounter for screening for other suspected endocrine disorder: Secondary | ICD-10-CM | POA: Diagnosis not present

## 2019-07-27 DIAGNOSIS — E785 Hyperlipidemia, unspecified: Secondary | ICD-10-CM

## 2019-07-27 DIAGNOSIS — Z23 Encounter for immunization: Secondary | ICD-10-CM

## 2019-07-27 DIAGNOSIS — F1010 Alcohol abuse, uncomplicated: Secondary | ICD-10-CM

## 2019-07-27 DIAGNOSIS — I6990 Unspecified sequelae of unspecified cerebrovascular disease: Secondary | ICD-10-CM

## 2019-07-27 DIAGNOSIS — Z87891 Personal history of nicotine dependence: Secondary | ICD-10-CM

## 2019-07-27 DIAGNOSIS — I10 Essential (primary) hypertension: Secondary | ICD-10-CM

## 2019-07-27 DIAGNOSIS — Z8673 Personal history of transient ischemic attack (TIA), and cerebral infarction without residual deficits: Secondary | ICD-10-CM

## 2019-07-27 DIAGNOSIS — Z13220 Encounter for screening for lipoid disorders: Secondary | ICD-10-CM | POA: Diagnosis not present

## 2019-07-27 DIAGNOSIS — E559 Vitamin D deficiency, unspecified: Secondary | ICD-10-CM | POA: Diagnosis not present

## 2019-07-27 DIAGNOSIS — Z1389 Encounter for screening for other disorder: Secondary | ICD-10-CM

## 2019-07-27 DIAGNOSIS — I6521 Occlusion and stenosis of right carotid artery: Secondary | ICD-10-CM

## 2019-07-27 DIAGNOSIS — Z0000 Encounter for general adult medical examination without abnormal findings: Secondary | ICD-10-CM | POA: Diagnosis not present

## 2019-07-27 DIAGNOSIS — E1159 Type 2 diabetes mellitus with other circulatory complications: Secondary | ICD-10-CM

## 2019-07-27 DIAGNOSIS — Z0001 Encounter for general adult medical examination with abnormal findings: Secondary | ICD-10-CM

## 2019-07-27 DIAGNOSIS — Z9109 Other allergy status, other than to drugs and biological substances: Secondary | ICD-10-CM

## 2019-07-27 NOTE — Telephone Encounter (Signed)
Pt paid for work forms. 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 Forms completed and given to pt.

## 2019-07-27 NOTE — Patient Instructions (Signed)
I had a long d/w patient about his remote TIA risk for recurrent stroke/TIAs, personally independently reviewed imaging studies and stroke evaluation results and answered questions.Continue aspirin 81 mg daily  for secondary stroke prevention and maintain strict control of hypertension with blood pressure goal below 130/90, diabetes with hemoglobin A1c goal below 6.5% and lipids with LDL cholesterol goal below 70 mg/dL. 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  Followup in the future with me in 1 year or call earlier if needed.

## 2019-07-27 NOTE — Progress Notes (Signed)
[facility] [address] [place] [id number] [phone]       OFFICE FOLLOW UP VISIT  [name] Date of Birth:  [birth year] Medical Record Number:  [id number]   Referring MD: Jared GaudierAshley Corbett, NP Reason for Referral: TIA  HPI:  Initial visit 07/30/2018 ; [name] is a 33 year old Caucasian male who is seen today for initial consultation visit for TIA and stroke.  History is obtained from the patient and review of electronic medical records.  I personally reviewed imaging films.  The patient states that on 07/14/2018 he developed sudden onset of nausea, dry heaving as well as some swallowing difficulties and noticed heaviness of the right face arm and leg after picking up his daughter from school.[place].  He describes a mild 3/10 pressure type headache over the right eye in the right temporal region as well as some tightness in the right side of the neck.  Symptoms lasted about 4 hours and then resolved completely.  The day prior to he was actually seen in the emergency room and was found to have significantly elevated blood pressure greater than 190 systolic.  The patient had been suffering from an cold and had been taking Sudafed recently prior to the present episode..  The patient's blood pressure was 190 systolic when he went to urgent care on 07/13/2018 but today in the office it is 128/79.  Interestingly the patient initially developed postcoital headaches on several occasions in October 2017.  [facility] and an MRI scan of the brain was obtained on 08/13/2016 which showed a subacute right superior cerebellar infarct.  MRA of the brain showed irregularity of the proximal right middle cerebral artery.  Patient was on phentermine for weight loss and this was felt to be cerebral reversible vasoconstriction syndrome.  This was discontinued.  He had a second MRI on 09/23/2016 which showed the right cerebellar infarct.  Interestingly on  the CT angiogram that he had recently on 07/14/2018 the right middle cerebral artery irregularities no longer seen.  He was found to have elevated LDL cholesterol of 172 mg percent and hemoglobin A1c of 10.9 on 07/14/2018.  Patient does complain of snoring, restless sleep and daytime sleepiness.  He has not been evaluated for sleep apnea.He is tolerating aspirin well without bruising or bleeding.  He remains on Lipitor 40 mg which was recently started but follow-up lipid profile has not yet been checked.  He states his sugars are well controlled even though the last A1c was quite high Update 11/02/2018 : He returns for follow-up after last visit 3 months ago.  He states he is doing well without recurrent stroke or TIA symptoms.  Is tolerating aspirin well without bruising or bleeding.  His blood pressure was elevated and primary care physician has increased the dose of Zestril to 20 mg and is doing much better and today it is 130/81.  He has also change his Lipitor to Crestor and is doing better and LDL cholesterol on 10/26/2018 was nearly optimal at 76 mg percent.  Hemoglobin A1c also has come down from 10.7 to 7.7.  He had lab work on 07/30/2018 which were all negative for hypercoagulable panel and vasculitis.  Transcranial Doppler bubble study on 08/25/2018 was negative for PFO.  Polysomnogram study on 08/27/2018 was negative for obstructive sleep apnea.  Patient states she started eating healthy and is starting a weight loss program.  He is also increase his physical activity.  He  wants to lose weight.  He has noticed that he is snoring much less as per his wife since Lipitor was changed to Crestor. Update 07/27/2019 : He returns for follow-up after last visit 9 months ago.  Continues to do well without recurrent TIA or stroke symptoms for no more than a year.  He remains on aspirin which is tolerating well without bleeding or bruising.  His blood pressure is well controlled today it is 128/90.  His blood sugars  however are still running high with fasting sugars in the 150s and last A1c being 7.6.  Is working with his primary care physician to get it under better control.  He remains on Crestor which is tolerating well and states his lipids are satisfactory.  He was evaluated for polysomnogram but did not seem to have significant obstructive sleep apnea.  He plans to eat healthy and lose weight.  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   ROS:   14 system review of systems is positive for  snoring, daytime tiredness and all other systems negative  PMH:  Past Medical History:  Diagnosis Date   Diabetes mellitus without complication (HCC)    Hypertension    Morbid obesity (HCC)    Stroke (HCC)    "mini stroke"    Social History:  Social History   Socioeconomic History   Marital status: Married    Spouse name: Not on file   Number of children: 1   Years of education: Not on file   Highest education level: Not on file  Occupational History   Not on file  Social Needs   Financial resource strain: Not on file   Food insecurity    Worry: Not on file    Inability: Not on file   Transportation needs    Medical: Not on file    Non-medical: Not on file  Tobacco Use   Smoking status: Former Smoker    Packs/day: 0.50    Years: 15.00    Pack years: 7.50    Types: Cigarettes    Quit date: 07/14/2018    Years since quitting: 1.0   Smokeless tobacco: Never Used  Substance and Sexual Activity   Alcohol use: Yes    Comment: 6 pack over the weekend   Drug use: Never   Sexual activity: Yes    Partners: Female    Birth control/protection: Implant  Lifestyle   Physical activity    Days per week: 0 days    Minutes per session: 0 min   Stress: Only a little  Relationships   Event organiser on phone: Not on file    Gets together: Not on file    Attends religious service: Not on file    Active member of club or organization: Not on file      Attends meetings of clubs or organizations: Not on file    Relationship status: Not on file   Intimate partner violence    Fear of current or ex partner: Not on file    Emotionally abused: Not on file    Physically abused: Not on file    Forced sexual activity: Not on file  Other Topics Concern   Not on file  Social History Narrative   Not on file    Medications:   Current Outpatient Medications on File Prior to Visit  Medication Sig Dispense Refill   aspirin 81 MG chewable tablet Chew 81 mg by mouth daily.  famotidine (PEPCID) 20 MG tablet Take 1 tablet (20 mg total) by mouth 2 (two) times daily as needed. 180 tablet 1   fluticasone (FLONASE) 50 MCG/ACT nasal spray Use 2 sprays each nostril 2 x  /Day 48 g 3   glucose blood (FREESTYLE LITE) test strip Test sugar once daily 50 each 12   lisinopril (ZESTRIL) 20 MG tablet Take 1 tablet (20 mg total) by mouth daily. 90 tablet 1   loratadine (CLARITIN) 10 MG tablet Take 10 mg by mouth daily as needed.      metFORMIN (GLUCOPHAGE) 500 MG tablet TAKE TWO TABLETS BY MOUTH WITH LARGEST MEAL OF THE DAY AND TAKE ONE TABLET WITH EACH OTHER MEAL FOR A TOTAL OF 4 DAILY 360 tablet 0   omeprazole (PRILOSEC) 40 MG capsule Take 1 capsule (40 mg total) by mouth daily. 30 min prior to breakfast. 30 capsule 1   rosuvastatin (CRESTOR) 40 MG tablet Take 1 tablet Daily for Cholesterol 90 tablet 1   No current facility-administered medications on file prior to visit.     Allergies:   Allergies  Allergen Reactions   Codeine Swelling    Physical Exam General: obese young Caucasian male, seated, in no evident distress Head: head normocephalic and atraumatic.   Neck: supple with no carotid or supraclavicular bruits Cardiovascular: regular rate and rhythm, no murmurs Musculoskeletal: no deformity Skin:  no rash/petichiae Vascular:  Normal pulses all extremities  Neurologic Exam Mental Status: Awake and fully alert. Oriented to  place and time. Recent and remote memory intact. Attention span, concentration and fund of knowledge appropriate. Mood and affect appropriate.  Cranial Nerves: Fundoscopic exam not done. Pupils equal, briskly reactive to light. Extraocular movements full without nystagmus. Visual fields full to confrontation. Hearing intact. Facial sensation intact. Face, tongue, palate moves normally and symmetrically.  Motor: Normal bulk and tone. Normal strength in all tested extremity muscles. Sensory.: intact to touch , pinprick , position and vibratory sensation.  Coordination: Rapid alternating movements normal in all extremities. Finger-to-nose and heel-to-shin performed accurately bilaterally. Gait and Station: Arises from chair without difficulty. Stance is normal. Gait demonstrates normal stride length and balance . Able to heel, toe and tandem walk without difficulty.  Reflexes: 1+ and symmetric. Toes downgoing.       ASSESSMENT: 49 year Caucasian male with episode of left hemispheric TIA in October 2019 likely from vasoconstriction from Sudafed use.  Prior history of likely right cerebellar infarct in 2017 secondary to cerebral reversible vasoconstriction syndrome while using phentermine and postcoital headaches.  Both episodes apparently happened in the setting of using vasoactive medications.  Multiple vascular risk factors of obesity, diabetes, hypertension, hyperlipidemia .    PLAN: I had a long d/w patient about his remote TIA risk for recurrent stroke/TIAs, personally independently reviewed imaging studies and stroke evaluation results and answered questions.Continue aspirin 81 mg daily  for secondary stroke prevention and maintain strict control of hypertension with blood pressure goal below 130/90, diabetes with hemoglobin A1c goal below 6.5% and lipids with LDL cholesterol goal below 70 mg/dL. 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  Followup in the future with me in 1 year or call earlier if needed. Followup in the future with me in 1 year or call earlier if necessaryI have advised him in the future to stay away  from vasoactive medications like cough and cold medicines and migraine medications like triptan's.  Greater than 50% time during this 25-minute consultation visit was spent on counseling and coordination of care about stroke, TIA, reversible cerebral vasoconstriction syndrome and answering questions     Delia Heady, MD  [facility] [address] [place], [place] [address]  Phone [phone] Fax [phone] Note: This document was prepared with digital dictation and possible smart phrase technology. Any transcriptional errors that result from this process are unintentional.

## 2019-07-27 NOTE — Patient Instructions (Addendum)
[name] , Thank you for taking time to come for your Annual Wellness Visit. I appreciate your ongoing commitment to your health goals. Please review the following plan we discussed and let me know if I can assist you in the future.   These are the goals we discussed: Goals    . Blood Pressure < 130/80    . DIET - INCREASE WATER INTAKE     80-100 fluid ounces    . Exercise 150 min/wk Moderate Activity    . HEMOGLOBIN A1C < 7.0    . LDL CALC < 70    . Weight (lb) < 275 lb (124.7 kg)       This is a list of the screening recommended for you and due dates:  Health Maintenance  Topic Date Due  . Pneumococcal vaccine  04/26/1987  . Eye exam for diabetics  04/26/1995  . HIV Screening  04/25/2000  . Flu Shot  05/08/2019  . Complete foot exam   07/21/2019  . Hemoglobin A1C  10/27/2019  . Tetanus Vaccine  10/26/2028     Know what a healthy weight is for you (roughly BMI <25) and aim to maintain this  Aim for 7+ servings of fruits and vegetables daily  65-80+ fluid ounces of water or unsweet tea for healthy kidneys  Limit to max 1 drink of alcohol per day; avoid smoking/tobacco  Limit animal fats in diet for cholesterol and heart health - choose grass fed whenever available  Avoid highly processed foods, and foods high in saturated/trans fats  Aim for low stress - take time to unwind and care for your mental health  Aim for 150 min of moderate intensity exercise weekly for heart health, and weights twice weekly for bone health  Aim for 7-9 hours of sleep daily    Drink 1/2 your body weight in fluid ounces of water daily; drink a tall glass of water 30 min before meals  Don't eat until you're stuffed- listen to your stomach and eat until you are 80% full   Try eating off of a salad plate; wait 10 min after finishing before going back for seconds  Start by eating the vegetables on your plate; aim for 50% of your meals to be fruits or vegetables  Then eat your protein  - lean meats (grass fed if possible), fish, beans, nuts in moderation  Eat your carbs/starch last ONLY if you still are hungry. If you can, stop before finishing it all  Avoid sugar and flour - the closer it looks to it's original form in nature, typically the better it is for you  Splurge in moderation - "assign" days when you get to splurge and have the "bad stuff" - I like to follow a 80% - 20% plan- "good" choices 80 % of the time, "bad" choices in moderation 20% of the time  Simple equation is: Calories out > calories in = weight loss - even if you eat the bad stuff, if you limit portions, you will still lose weight       When it comes to diets, agreement about the perfect plan isn't easy to find, even among the experts. Experts at the [employer] developed an idea known as the Healthy Eating Plate. Just imagine a plate divided into logical, healthy portions.  The emphasis is on diet quality:  Load up on vegetables and fruits - one-half of your plate: Aim for color and variety, and remember that potatoes  don't count.  Go for whole grains - one-quarter of your plate: Whole wheat, barley, wheat berries, quinoa, oats, brown rice, and foods made with them. If you want pasta, go with whole wheat pasta.  Protein power - one-quarter of your plate: Fish, chicken, beans, and nuts are all healthy, versatile protein sources. Limit red meat.  The diet, however, does go beyond the plate, offering a few other suggestions.  Use healthy plant oils, such as olive, canola, soy, corn, sunflower and peanut. Check the labels, and avoid partially hydrogenated oil, which have unhealthy trans fats.  If you're thirsty, drink water. Coffee and tea are good in moderation, but skip sugary drinks and limit milk and dairy products to one or two daily servings.  The type of carbohydrate in the diet is more important than the amount. Some sources of carbohydrates, such as vegetables, fruits,  whole grains, and beans-are healthier than others.  Finally, stay active.

## 2019-07-28 LAB — CBC WITH DIFFERENTIAL/PLATELET
Absolute Monocytes: 666 cells/uL (ref 200–950)
Basophils Absolute: 54 cells/uL (ref 0–200)
Basophils Relative: 0.6 %
Eosinophils Absolute: 297 cells/uL (ref 15–500)
Eosinophils Relative: 3.3 %
HCT: 44 % (ref 38.5–50.0)
Hemoglobin: 14.9 g/dL (ref 13.2–17.1)
Lymphs Abs: 2682 cells/uL (ref 850–3900)
MCH: 29 pg (ref 27.0–33.0)
MCHC: 33.9 g/dL (ref 32.0–36.0)
MCV: 85.8 fL (ref 80.0–100.0)
MPV: 9.4 fL (ref 7.5–12.5)
Monocytes Relative: 7.4 %
Neutro Abs: 5301 cells/uL (ref 1500–7800)
Neutrophils Relative %: 58.9 %
Platelets: 345 10*3/uL (ref 140–400)
RBC: 5.13 10*6/uL (ref 4.20–5.80)
RDW: 12.7 % (ref 11.0–15.0)
Total Lymphocyte: 29.8 %
WBC: 9 10*3/uL (ref 3.8–10.8)

## 2019-07-28 LAB — HEMOGLOBIN A1C
Hgb A1c MFr Bld: 8.1 % of total Hgb — ABNORMAL HIGH (ref <5.7)
Mean Plasma Glucose: 186 (calc)
eAG (mmol/L): 10.3 (calc)

## 2019-07-28 LAB — VITAMIN D 25 HYDROXY (VIT D DEFICIENCY, FRACTURES): Vit D, 25-Hydroxy: 18 ng/mL — ABNORMAL LOW (ref 30–100)

## 2019-07-28 LAB — COMPLETE METABOLIC PANEL WITH GFR
AG Ratio: 1.7 (calc) (ref 1.0–2.5)
ALT: 51 U/L — ABNORMAL HIGH (ref 9–46)
AST: 20 U/L (ref 10–40)
Albumin: 4.9 g/dL (ref 3.6–5.1)
Alkaline phosphatase (APISO): 54 U/L (ref 36–130)
BUN: 14 mg/dL (ref 7–25)
CO2: 24 mmol/L (ref 20–32)
Calcium: 10.5 mg/dL — ABNORMAL HIGH (ref 8.6–10.3)
Chloride: 101 mmol/L (ref 98–110)
Creat: 0.92 mg/dL (ref 0.60–1.35)
GFR, Est African American: 125 mL/min/{1.73_m2} (ref >=60)
GFR, Est Non African American: 108 mL/min/{1.73_m2} (ref >=60)
Globulin: 2.9 g/dL (calc) (ref 1.9–3.7)
Glucose, Bld: 157 mg/dL — ABNORMAL HIGH (ref 65–99)
Potassium: 4.2 mmol/L (ref 3.5–5.3)
Sodium: 137 mmol/L (ref 135–146)
Total Bilirubin: 1 mg/dL (ref 0.2–1.2)
Total Protein: 7.8 g/dL (ref 6.1–8.1)

## 2019-07-28 LAB — URINALYSIS, ROUTINE W REFLEX MICROSCOPIC
Bilirubin Urine: NEGATIVE
Glucose, UA: NEGATIVE
Hgb urine dipstick: NEGATIVE
Ketones, ur: NEGATIVE
Leukocytes,Ua: NEGATIVE
Nitrite: NEGATIVE
Protein, ur: NEGATIVE
Specific Gravity, Urine: 1.011 (ref 1.001–1.03)
pH: 5.5 (ref 5.0–8.0)

## 2019-07-28 LAB — TSH: TSH: 1.91 mIU/L (ref 0.40–4.50)

## 2019-07-28 LAB — LIPID PANEL
Cholesterol: 163 mg/dL (ref <200)
HDL: 41 mg/dL (ref >=40)
LDL Cholesterol (Calc): 100 mg/dL (calc) — ABNORMAL HIGH
Non-HDL Cholesterol (Calc): 122 mg/dL (calc) (ref <130)
Total CHOL/HDL Ratio: 4 (calc) (ref <5.0)
Triglycerides: 123 mg/dL (ref <150)

## 2019-07-28 LAB — MICROALBUMIN / CREATININE URINE RATIO
Creatinine, Urine: 68 mg/dL (ref 20–320)
Microalb Creat Ratio: 18 mcg/mg creat (ref <30)
Microalb, Ur: 1.2 mg/dL

## 2019-07-28 LAB — MAGNESIUM: Magnesium: 1.8 mg/dL (ref 1.5–2.5)

## 2019-08-04 ENCOUNTER — Other Ambulatory Visit: Payer: Self-pay | Admitting: Adult Health

## 2019-08-04 MED ORDER — BYDUREON BCISE 2 MG/0.85ML ~~LOC~~ AUIJ: 2.0000 mg | AUTO-INJECTOR | SUBCUTANEOUS | 2 refills | Status: DC

## 2019-08-11 IMAGING — MR MR HEAD W/O CM
12 of 14 series · 21 of 48 positions shown · non-contrast
Comparison: Head CT 07/14/2018

Brain MRI 09/23/2016

CLINICAL DATA: Right-sided weakness.

EXAM:
MRI HEAD WITHOUT CONTRAST
TECHNIQUE: Multiplanar, multiecho pulse sequences of the brain and surrounding
structures were obtained without intravenous contrast.

[Series 5: DWI · axial · 4.0mm · 0.92mm/px · 1 of 80 slices shown (1 of 4)]
[im 1/80]
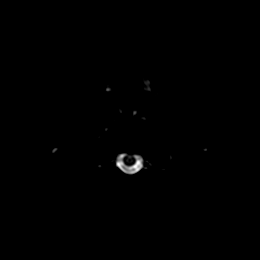

[Series 6: DWI · axial · 4.0mm · 0.92mm/px · 1 of 40 slices shown (2 of 4)]
[im 1/40]
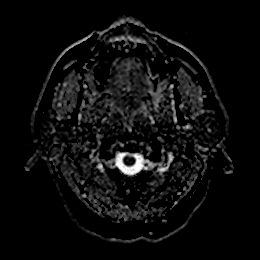

[Series 7: DWI · coronal · 4.0mm · 0.88mm/px · 2 of 70 slices shown (3 of 4)]
[im 1/70]
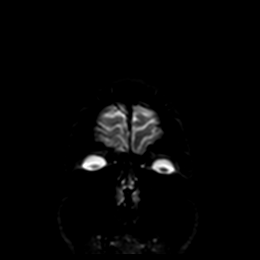
[im 70/70]
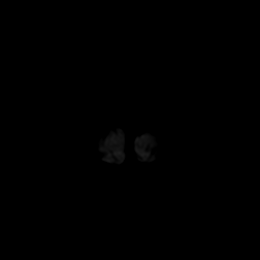

[Series 8: DWI · coronal · 4.0mm · 0.88mm/px · 1 of 35 slices shown (4 of 4)]
[im 1/35]
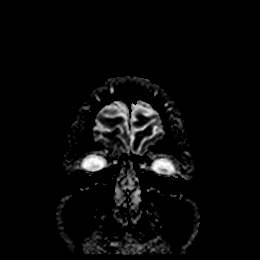

[Series 9: T1 · sagittal · 5.0mm · 0.78mm/px · 1 of 23 slices shown]
[im 1/23]
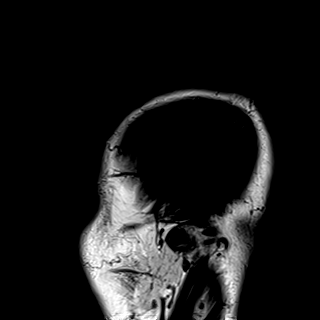

[Series 10: FLAIR · axial · 5.0mm · 0.45mm/px · 1 of 28 slices shown]
[im 1/28]
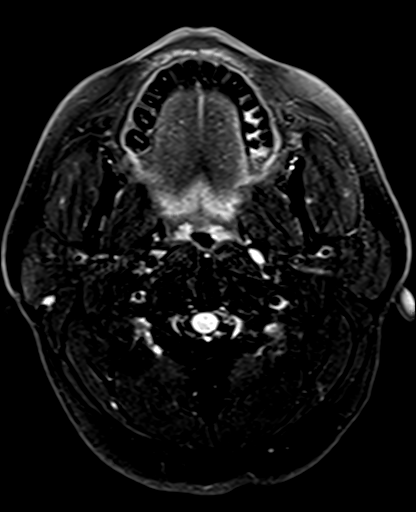

[Series 11: swi_images · axial · 3.0mm · 0.94mm/px · z∈[-75,+89]mm · 2 of 56 slices shown]
[im 1/56]
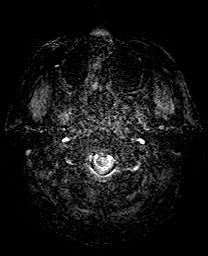
[im 56/56]
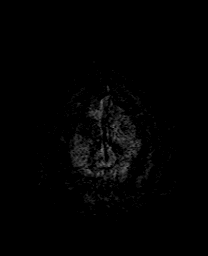

[Series 12: mip_images(sw) · axial · 24.0mm · 0.94mm/px · z∈[-64,+79]mm · 2 of 49 slices shown]
[im 1/49]
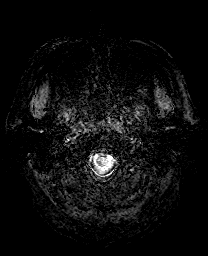
[im 49/49]
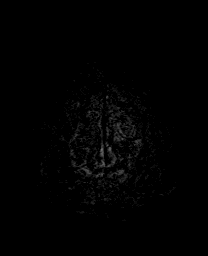

[Series 13: T2 · axial · 5.0mm · 0.75mm/px · 1 of 28 slices shown (1 of 2)]
[im 1/28]
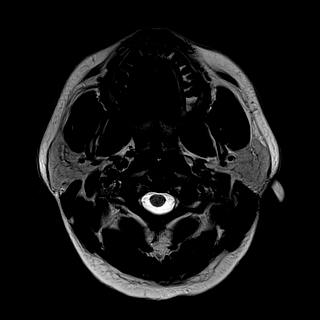

[Series 15: T2 · coronal · 5.0mm · 0.34mm/px · 1 of 29 slices shown (2 of 2)]
[im 1/29]
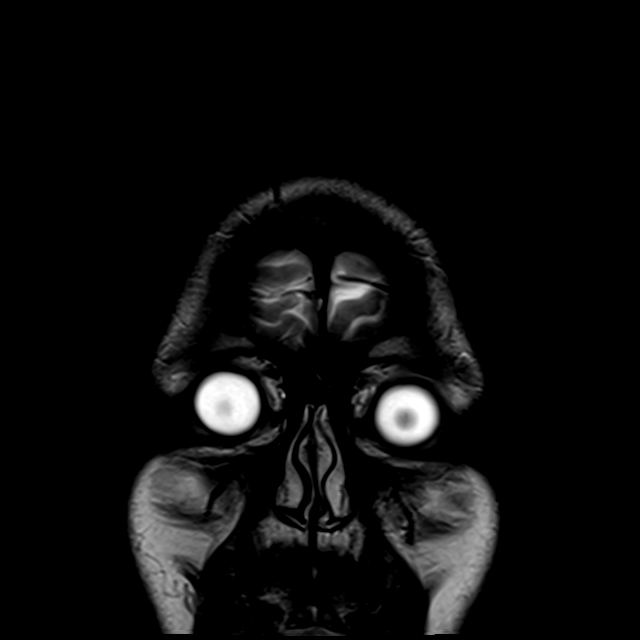

[Series 16: t2_space_dark-fluid_sag_p2_ns-ir · sagittal · 1.0mm · 0.49mm/px · 6 of 192 slices shown]
[im 1/192]
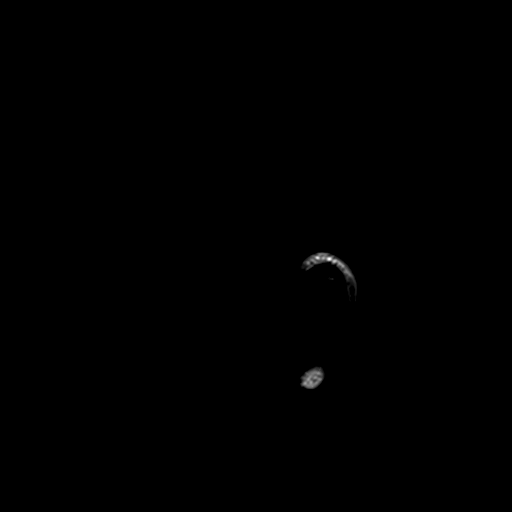
[im 39/192]
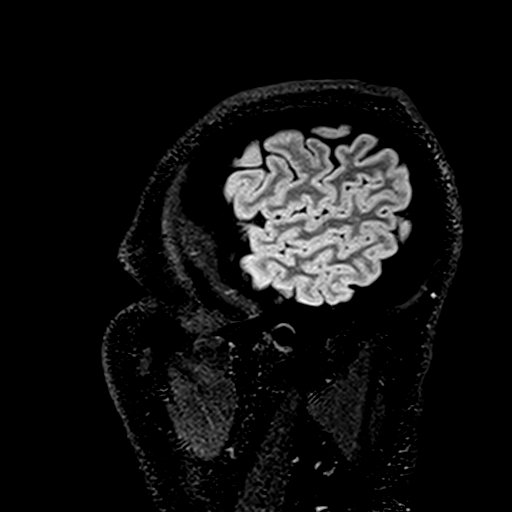
[im 77/192]
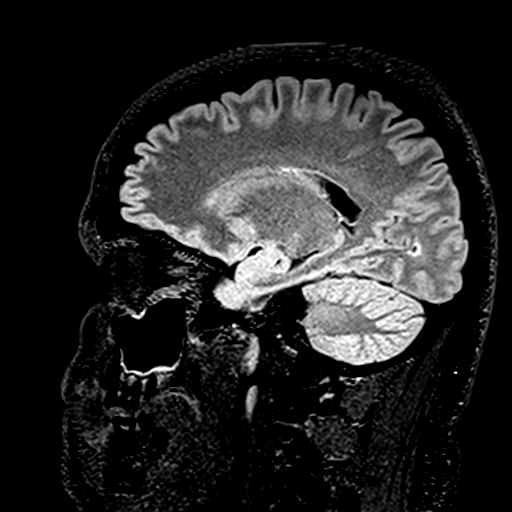
[im 115/192]
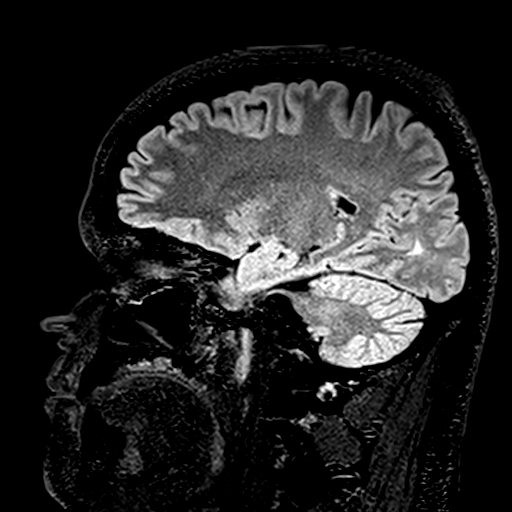
[im 153/192]
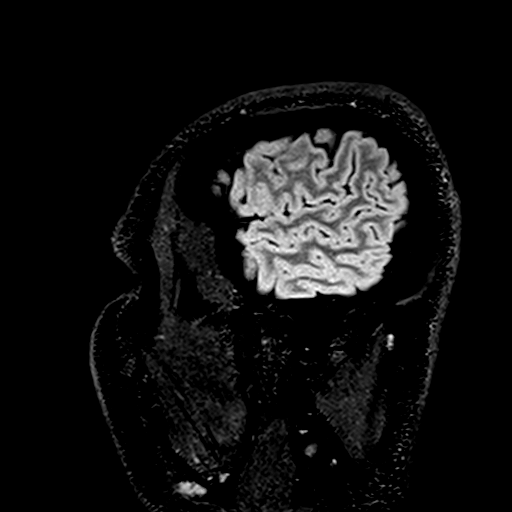
[im 192/192]
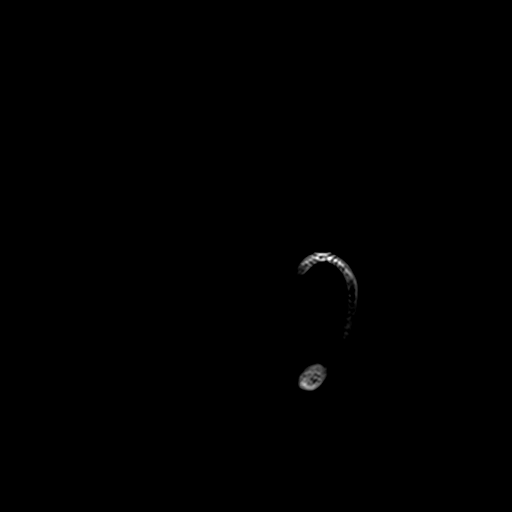

[Series 1001: mpr 0.5mm range · axial · 0.5mm · 0.24mm/px · z∈[-65,-32]mm · 2 of 410 slices shown]
[im 1/410]
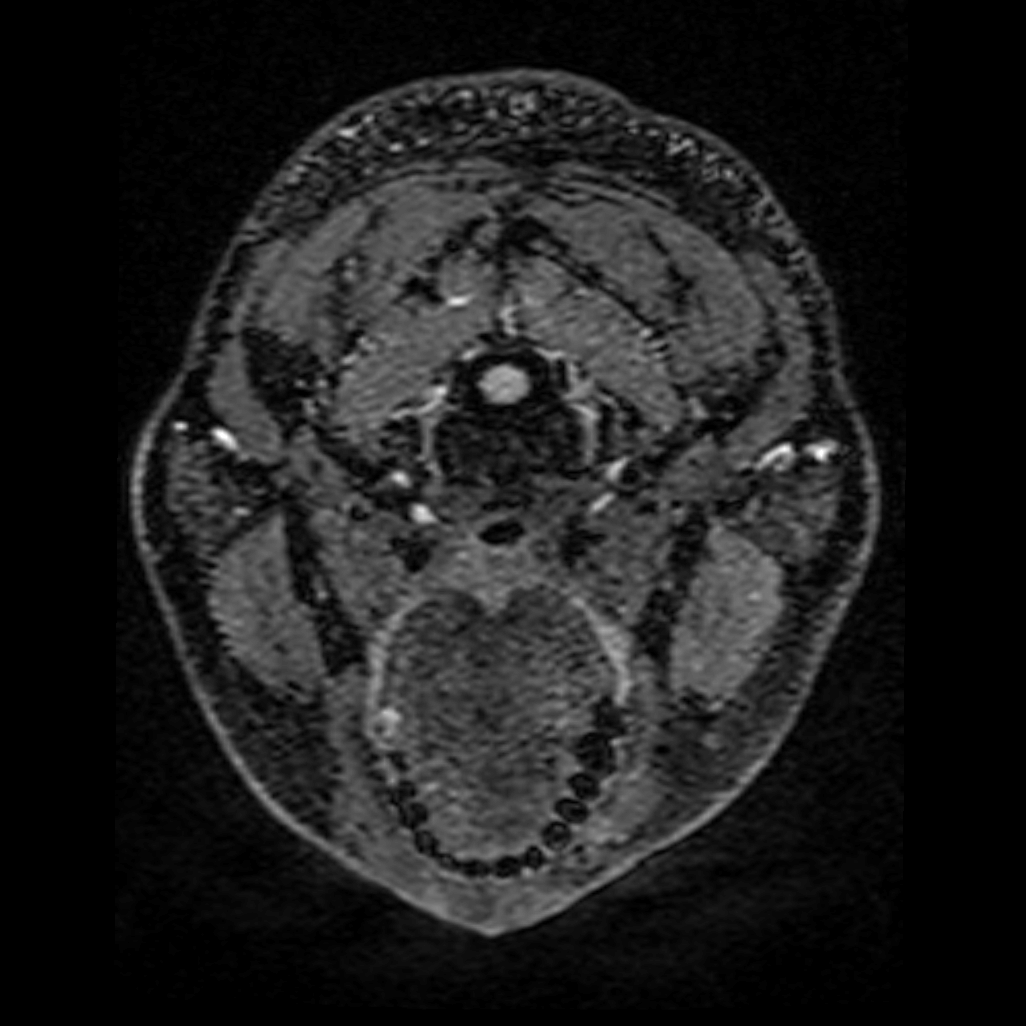
[im 69/410]
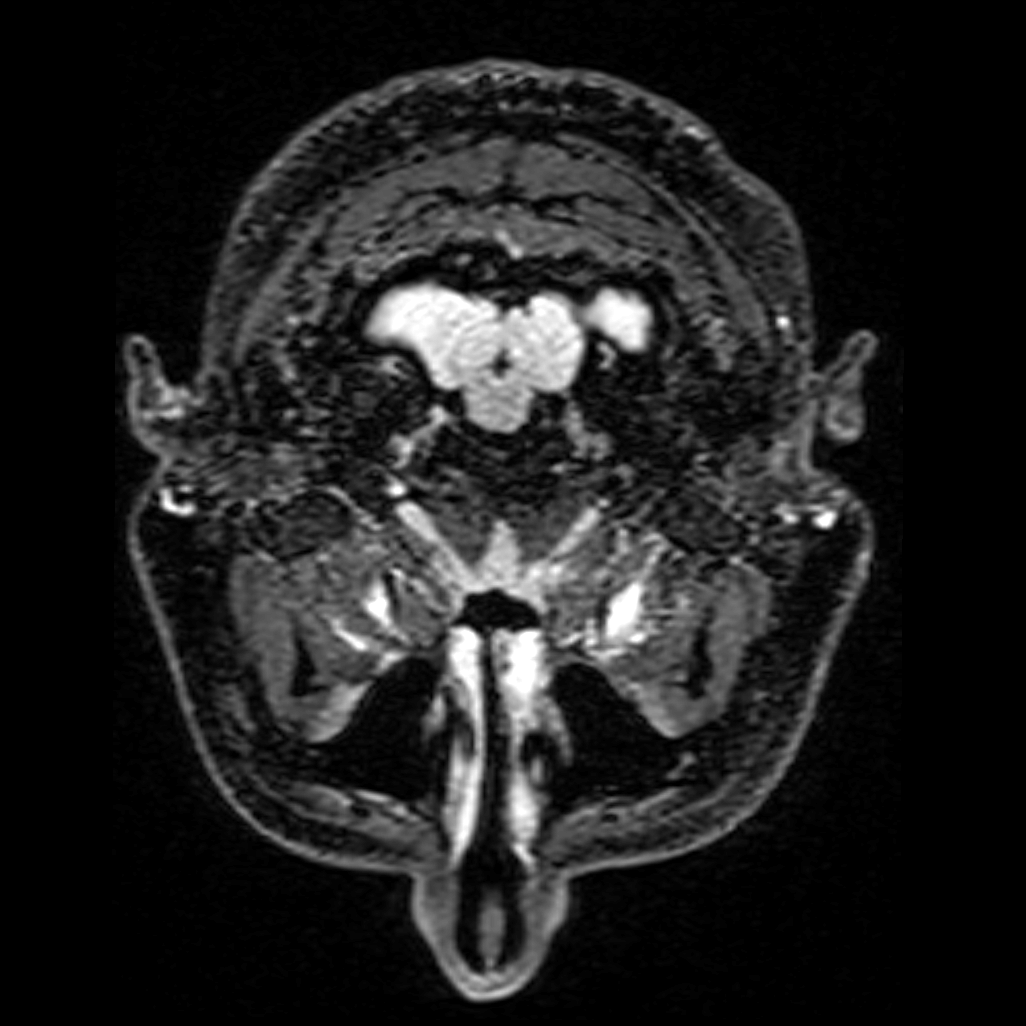

[21 of 48 positions shown; findings below may reference images not displayed]

FINDINGS: BRAIN: There is no acute infarct, acute hemorrhage, hydrocephalus or
extra-axial collection. The midline structures are normal. No
midline shift or other mass effect. There is a focus of hyperintense
T2-weighted signal along the medial superior surface of the right
cerebellar hemisphere (series 10, image 11). This is unchanged from
the prior examination of 09/23/2016. The white matter signal is
normal for the patient's age. The cerebral and cerebellar volume are
age-appropriate. Susceptibility-sensitive sequences show no chronic
microhemorrhage or superficial siderosis.

VASCULAR: Major intracranial arterial and venous sinus flow voids
are normal.

SKULL AND UPPER CERVICAL SPINE: Calvarial bone marrow signal is
normal. There is no skull base mass. Visualized upper cervical spine
and soft tissues are normal.

SINUSES/ORBITS: No fluid levels or advanced mucosal thickening. No
mastoid or middle ear effusion. The orbits are normal.
IMPRESSION: 1. Unchanged appearance of hyperintense T2-weighted signal at the
superomedial right cerebellar hemisphere, suspected to be the
sequela of remote ischemic incident.
2. Otherwise normal MRI of the brain.

## 2019-10-02 DIAGNOSIS — Z20828 Contact with and (suspected) exposure to other viral communicable diseases: Secondary | ICD-10-CM | POA: Diagnosis not present

## 2019-10-04 DIAGNOSIS — U071 COVID-19: Secondary | ICD-10-CM

## 2019-10-04 NOTE — Telephone Encounter (Signed)
Virtual Visit via Telephone Note  I connected with Jared Ramsey on 10/04/2019 at 13:00 by telephone and verified that I am speaking with the correct person using two identifiers.  Location: Patient: Home Provider: [place] office   I discussed the limitations, risks, security and privacy concerns of performing an evaluation and management service by telephone and the availability of in person appointments. I also discussed with the patient that there may be a patient responsible charge related to this service. The patient expressed understanding and agreed to proceed.  History of Present Illness:  33 y.o. reported positive covid 19 results.   He reports on Christmas day started having nasal congestion, developed fever/chills, temp spiked up to 104-105 the next day but reports this resolved by next day. Got covid 19 testing on Sat 09/22/2019. Reports ongoing mild sinus congestion which he reports hasn't been taking anything other than Flonase. Denies fever spike since that time. Overall he is doing well, just wanted to notify us. He has been pushing fluids, he is on ASA, he is on vitamin D.   He plans to remain out of work x 10 days, plans to get repeat negative test prior to returning per his work Act[employer].   Allergies:  Allergies  Allergen Reactions  . Codeine Swelling   Medical History:  has Hypertension; History of CVA (cerebrovascular accident); Morbid obesity ([place]); Environmental allergies; Alcohol consumption binge drinking; Former smoker (quit 07/2018, 7.5 pack year history); Stenosis of right carotid artery; Type 2 diabetes mellitus with circulatory disorder ([place]); Hyperlipidemia associated with type 2 diabetes mellitus ([place]); and Acid reflux on their problem list. Surgical History:  He  has a past surgical history that includes Tonsillectomy and Plantar's wart excision (Left, 2003). Family History:  Hisfamily history includes Alzheimer's disease in his maternal grandmother and  paternal grandmother; Diabetes in his maternal grandmother, mother, and sister; Emphysema in his paternal grandmother; Epilepsy in his sister; Hyperlipidemia in his father and paternal grandfather; Hypertension in his father, maternal grandmother, mother, paternal grandfather, and sister; Lymphoma in his mother. Social History:   reports that he quit smoking about 14 months ago. His smoking use included cigarettes. He has a 7.50 pack-year smoking history. He has never used smokeless tobacco. He reports current alcohol use. He reports that he does not use drugs.    Observations/Objective:  General : Well sounding patient in no apparent distress HEENT: no hoarseness, no cough for duration of visit Lungs: speaks in complete sentences, no audible wheezing, no apparent distress Neurological: alert, oriented x 3 Psychiatric: pleasant, judgement appropriate   Assessment and Plan:  Diagnoses and all orders for this visit:  COVID-19 virus infection Mild sympotms Continue self isolation at home Patient understands will not break quarantine until meets CDC criteria and/or negative covid 19 test Suggested symptomatic OTC remedies. Tylenol PRN fever 101+ Stay on ASA, vitamin D, add vitamin C Nasal steroids, staying hydrated Follow up as needed via mychart or text. Please go to the ER or call 911 if symptoms become worse.   Follow Up Instructions:    I discussed the assessment and treatment plan with the patient. The patient was provided an opportunity to ask questions and all were answered. The patient agreed with the plan and demonstrated an understanding of the instructions.   The patient was advised to call back or seek an in-person evaluation if the symptoms worsen or if the condition fails to improve as anticipated.  I provided 15 minutes of non-face-to-face time during this encounter.  Dan Maker, NP

## 2019-10-06 ENCOUNTER — Telehealth: Payer: Self-pay | Admitting: Infectious Diseases

## 2019-10-06 ENCOUNTER — Other Ambulatory Visit: Payer: Self-pay | Admitting: Infectious Diseases

## 2019-10-06 DIAGNOSIS — U071 COVID-19: Secondary | ICD-10-CM

## 2019-10-06 NOTE — Progress Notes (Signed)
See phone note from 10/06/2019  

## 2019-10-06 NOTE — Telephone Encounter (Signed)
  I connected by phone with Jared Ramsey on 10/06/2019 at 3:57 PM to discuss the potential use of an new treatment for mild to moderate COVID-19 viral infection in non-hospitalized patients.  This patient is a 33 y.o. male that meets the FDA criteria for Emergency Use Authorization of bamlanivimab or casirivimab\imdevimab.  Has a (+) direct SARS-CoV-2 viral test result  Has mild or moderate COVID-19   Is ? 33 years of age and weighs ? 40 kg  Is NOT hospitalized due to COVID-19  Is NOT requiring oxygen therapy or requiring an increase in baseline oxygen flow rate due to COVID-19  Is within 10 days of symptom onset  Has at least one of the high risk factor(s) for progression to severe COVID-19 and/or hospitalization as defined in EUA.  Specific high risk criteria : Diabetes, BMI > 35   I have spoken and communicated the following to the patient or parent/caregiver:  1. FDA has authorized the emergency use of bamlanivimab and casirivimab\imdevimab for the treatment of mild to moderate COVID-19 in adults and pediatric patients with positive results of direct SARS-CoV-2 viral testing who are 65 years of age and older weighing at least 40 kg, and who are at high risk for progressing to severe COVID-19 and/or hospitalization.  2. The significant known and potential risks and benefits of bamlanivimab and casirivimab\imdevimab, and the extent to which such potential risks and benefits are unknown.  3. Information on available alternative treatments and the risks and benefits of those alternatives, including clinical trials.  4. Patients treated with bamlanivimab and casirivimab\imdevimab should continue to self-isolate and use infection control measures (e.g., wear mask, isolate, social distance, avoid sharing personal items, clean and disinfect "high touch" surfaces, and frequent handwashing) according to CDC guidelines.   5. The patient or parent/caregiver has the option to accept or  refuse bamlanivimab or casirivimab\imdevimab .  After reviewing this information with the patient, The patient agreed to proceed with receiving the bamlanimivab infusion and will be provided a copy of the Fact sheet prior to receiving the infusion.[name] 10/06/2019 3:57 PM

## 2019-10-11 ENCOUNTER — Ambulatory Visit (HOSPITAL_COMMUNITY): Payer: BC Managed Care – PPO

## 2019-10-18 ENCOUNTER — Other Ambulatory Visit: Payer: Self-pay | Admitting: Adult Health

## 2019-11-02 ENCOUNTER — Ambulatory Visit: Payer: BLUE CROSS/BLUE SHIELD | Admitting: Neurology

## 2019-11-15 DIAGNOSIS — Z8616 Personal history of COVID-19: Secondary | ICD-10-CM

## 2019-11-15 HISTORY — DX: Personal history of COVID-19: Z86.16

## 2019-11-15 NOTE — Progress Notes (Signed)
FOLLOW UP  Assessment and Plan:   Hypertension, unspecified type Continue medication-lisinopril 20 mg daily  Addition of jardiance may improve some  Monitor blood pressure at home; call if consistently over 130/80 Continue DASH diet.   Reminder to go to the ER if any CP, SOB, nausea, dizziness, severe HA, changes vision/speech, left arm numbness and tingling and jaw pain. -     COMPLETE METABOLIC PANEL WITH GFR -     Magnesium  Type 2 diabetes mellitus with other specified complication, without long-term current use of insulin [facility]) Education: Reviewed 'ABCs' of diabetes management (respective goals in parentheses):  A1C (<7), blood pressure (<130/80), and cholesterol (LDL <70) Eye Exam yearly and Dental Exam every 6 months. Dietary recommendations Physical Activity recommendations He is doing well with 2000 mg daily metformin; strongly motivated to work on lifestyle and we are willing to work with this as long as we continue to see progress with lifestyle modification and weight loss going forward SE with ozempic and bydurion Bcise - nausea and bloating persistent; suggested senokot, short term reglan discussed and sent in today to try Discussed Jardiance; pending A1C given 10 mg tabs x 1 month and 25 mg tabs x 1 month; will defer if A1C is 9+ due to increased risk of mycotic infections NEEDS TO CHECK FASTING - minimum 2-3 days/week, keep a log -     Hemoglobin A1c   History of CVA (cerebrovascular accident) Avoid vasocontrictive meds; continue ASA;  Control blood pressure, cholesterol, glucose, increase exercise.  -     Lipid panel  Morbid obesity ([place]) Long discussion about weight loss, diet, and exercise Recommended diet heavy in fruits and veggies and low in animal meats, cheeses, and dairy products, appropriate calorie intake Patient will work on cutting down on portions, avoiding binge eating/drinking Discussed appropriate weight for height and initial goal (<300lb) - he is  confident he can achieve this in the next 3-6 months Intolerant of phentermine, topamax, GLP-1i; consider wellbutrin or naltrexone, would consider bariatric surgery  Discussed macros, portions, mindful eating; consider noom, next 56 days Follow up at next visit  Former smoker Has quit smoking Strategies for ongoing success discussed  Hyperlipidemia associated with type 2 diabetes mellitus ([place]) Continue medications: rosuvastatin 40 mg daily LDL goal <70  Continue low cholesterol diet and exercise.  Check lipid panel.  -     Lipid panel -     TSH  Vitamin D deficiency Has increased dose to 10000 IU; goal 60-100 Check vitamin D level today   Continue diet and meds as discussed. Further disposition pending results of labs. Discussed med's effects and SE's.   Over 30 minutes of exam, counseling, chart review, and critical decision making was performed.   Future Appointments  Date Time Provider [employer]  07/27/2020  3:00 PM Liane Comber, NP [facility] None  09/05/2020 11:30 AM Garvin Fila, MD [facility] None    ----------------------------------------------------------------------------------------------------------------------  HPI 34 y.o. male  presents for 3 month follow up on hypertension, cholesterol, diabetes, morbid obesity and vitamin D deficiency.   Was diagnosed with covid 23 in Dec 2020, reports has recovered well without sequela.   In 07/2018 he was evaluated in ED for right sided headache and extremity heaviness. CTA head showed Subtle low-density in the superior paramedian right cerebellum, compared to MRI in 2017.  He was evaluated by Dr. Leonie Man and concluded episode of left hemispheric TIA in October 2019 likely from vasoconstriction from Sudafed use.  Prior history of likely  right cerebellar infarct in 2017 secondary to cerebral reversible vasoconstriction syndrome. He was recommended aggressive lifestyle modification and avoidance of vasoconstrictive  agents, sudafed, phentermiene.   BMI is Body mass index is 43.71 kg/m., he reports back on diet after covid 19, walking daily. Scheduled to do a 5k run with wife, has been very supportive.  Intolerant of phentermine/topamax in the past Goal is <300 lb by next visit per patient, then <275lb next year.  Trying to push veggies, leafy greens, cabbage/greens prior to main meal Taking small snack with him in truck to work Water intake: over a gallon daily, or unsweet tea, black coffee  Alcohol: minimal in the last 3 months Carb intake is variable but trying to cut back, occasionally has to stop at subway, etc if desperate on road; chooses whole wheat Wt Readings from Last 3 Encounters:  11/16/19 (!) 326 lb 12.8 oz (148.2 kg)  07/27/19 (!) 318 lb (144.2 kg)  07/27/19 (!) 319 lb (144.7 kg)   His blood pressure has been controlled at home (125-130/80s), today their BP is BP: 138/88  He does workout. He denies chest pain, shortness of breath, dizziness.   He is on cholesterol medication Rosuvastatin 40 mg daily and denies myalgias. His cholesterol is not at goal. The cholesterol last visit was:   Lab Results  Component Value Date   CHOL 163 07/27/2019   HDL 41 07/27/2019   LDLCALC 100 (H) 07/27/2019   TRIG 123 07/27/2019   CHOLHDL 4.0 07/27/2019    He has been working on diet and exercise for T2DM newly on metformin 2000 mg daily, (SE with bydureon, ozempic), working aggressively on lifestyle per patient preference, and denies increased appetite, nausea, paresthesia of the feet, polydipsia, polyuria, visual disturbances and vomiting. He admits hasn't been checking fasting regularly (160-175), has checked random during the day (200-210). Last in the office was:  Lab Results  Component Value Date   HGBA1C 8.1 (H) 07/27/2019    Last GFR:  Lab Results  Component Value Date   GFRNONAA 108 07/27/2019   Patient is on Vitamin D supplement, newly taking 99371 IU since the last visit.   Lab  Results  Component Value Date   VD25OH 18 (L) 07/27/2019        Current Medications:  Current Outpatient Medications on File Prior to Visit  Medication Sig  . aspirin 81 MG chewable tablet Chew 81 mg by mouth daily.   . famotidine (PEPCID) 20 MG tablet Take 1 tablet (20 mg total) by mouth 2 (two) times daily as needed.  Marland Kitchen glucose blood (FREESTYLE LITE) test strip Test sugar once daily  . lisinopril (ZESTRIL) 20 MG tablet Take 1 tablet (20 mg total) by mouth daily.  Marland Kitchen loratadine (CLARITIN) 10 MG tablet Take 10 mg by mouth daily as needed.   . metFORMIN (GLUCOPHAGE) 500 MG tablet TAKE TWO TABLETS BY MOUTH WITH LARGEST MEAL OF THE DAY AND TAKE ONE TABLET WITH EACH OTHER MEAL FOR A TOTAL OF 4 DAILY   No current facility-administered medications on file prior to visit.     Allergies:  Allergies  Allergen Reactions  . Codeine Swelling     Medical History:  Past Medical History:  Diagnosis Date  . Diabetes mellitus without complication (HCC)   . Hypertension   . Morbid obesity ([facility])   . Stroke [facility])    "mini stroke"   Family history- Reviewed and unchanged Social history- Reviewed and unchanged   Review of Systems:  Review of  Systems  Constitutional: Negative for malaise/fatigue and weight loss.  HENT: Negative for hearing loss and tinnitus.   Eyes: Negative for blurred vision and double vision.  Respiratory: Negative for cough, shortness of breath and wheezing.   Cardiovascular: Negative for chest pain, palpitations, orthopnea, claudication and leg swelling.  Gastrointestinal: Positive for constipation, heartburn and nausea. Negative for abdominal pain, blood in stool, diarrhea, melena and vomiting.  Genitourinary: Negative.   Musculoskeletal: Negative for joint pain and myalgias.  Skin: Negative for rash.  Neurological: Negative for dizziness, tingling, sensory change, weakness and headaches.  Endo/Heme/Allergies: Negative for polydipsia.  Psychiatric/Behavioral:  Negative.   All other systems reviewed and are negative.   Physical Exam: BP 138/88   Pulse (!) 106   Temp (!) 97.5 F (36.4 C)   Wt (!) 326 lb 12.8 oz (148.2 kg)   SpO2 98%   BMI 43.71 kg/m  Wt Readings from Last 3 Encounters:  11/16/19 (!) 326 lb 12.8 oz (148.2 kg)  07/27/19 (!) 318 lb (144.2 kg)  07/27/19 (!) 319 lb (144.7 kg)   General Appearance: Well nourished, well dressed, morbidly obese young male in no apparent distress. Eyes: PERRLA, EOMs, conjunctiva no swelling or erythema Sinuses: No Frontal/maxillary tenderness ENT/Mouth: Ext aud canals clear, TMs without erythema, bulging. Mask in place; oral exam deferred. Hearing normal.  Neck: Supple, thyroid normal.  Respiratory: Respiratory effort normal, BS equal bilaterally without rales, rhonchi, wheezing or stridor.  Cardio: RRR with no MRGs. Brisk peripheral pulses without edema.  Abdomen: Soft, obese abdomen, + BS.  Non tender, no guarding, rebound, hernias, masses. Lymphatics: Non tender without lymphadenopathy.  Musculoskeletal: Full ROM, 5/5 strength, Normal gait Skin: Warm, dry without rashes, lesions, ecchymosis.  Neuro: Cranial nerves intact. No cerebellar symptoms.  Psych: Awake and oriented X 3, normal affect, Insight and Judgment appropriate.    Dan Maker, NP 5:17 PM [facility]

## 2019-11-16 ENCOUNTER — Ambulatory Visit: Payer: BC Managed Care – PPO | Admitting: Adult Health

## 2019-11-16 ENCOUNTER — Other Ambulatory Visit: Payer: Self-pay

## 2019-11-16 ENCOUNTER — Encounter: Payer: Self-pay | Admitting: Adult Health

## 2019-11-16 VITALS — BP 138/88 | HR 106 | Temp 97.5°F | Wt 326.8 lb

## 2019-11-16 DIAGNOSIS — E1169 Type 2 diabetes mellitus with other specified complication: Secondary | ICD-10-CM | POA: Diagnosis not present

## 2019-11-16 DIAGNOSIS — I10 Essential (primary) hypertension: Secondary | ICD-10-CM | POA: Diagnosis not present

## 2019-11-16 DIAGNOSIS — Z79899 Other long term (current) drug therapy: Secondary | ICD-10-CM | POA: Diagnosis not present

## 2019-11-16 DIAGNOSIS — E785 Hyperlipidemia, unspecified: Secondary | ICD-10-CM | POA: Diagnosis not present

## 2019-11-16 DIAGNOSIS — E1159 Type 2 diabetes mellitus with other circulatory complications: Secondary | ICD-10-CM | POA: Diagnosis not present

## 2019-11-16 DIAGNOSIS — E559 Vitamin D deficiency, unspecified: Secondary | ICD-10-CM

## 2019-11-16 DIAGNOSIS — Z8673 Personal history of transient ischemic attack (TIA), and cerebral infarction without residual deficits: Secondary | ICD-10-CM

## 2019-11-16 DIAGNOSIS — K219 Gastro-esophageal reflux disease without esophagitis: Secondary | ICD-10-CM

## 2019-11-16 DIAGNOSIS — Z87891 Personal history of nicotine dependence: Secondary | ICD-10-CM

## 2019-11-16 DIAGNOSIS — Z8616 Personal history of COVID-19: Secondary | ICD-10-CM

## 2019-11-16 MED ORDER — ROSUVASTATIN CALCIUM 40 MG PO TABS: 40.0000 mg | ORAL_TABLET | Freq: Every day | ORAL | 0 refills | Status: DC

## 2019-11-16 MED ORDER — FLUTICASONE PROPIONATE 50 MCG/ACT NA SUSP: NASAL | 3 refills | Status: DC

## 2019-11-16 MED ORDER — METOCLOPRAMIDE HCL 10 MG PO TABS: 10.0000 mg | ORAL_TABLET | Freq: Three times a day (TID) | ORAL | 0 refills | Status: DC

## 2019-11-16 NOTE — Patient Instructions (Addendum)
Goals    . Blood Pressure < 130/80    . DIET - INCREASE WATER INTAKE     80-100 fluid ounces    . Exercise 150 min/wk Moderate Activity    . HEMOGLOBIN A1C < 7.0    . LDL CALC < 70    . Weight (lb) < 275 lb (124.7 kg)       Senokot - gentle motility agent to get bowels moving  Reglan - take three times a day with meals - short term until bloating/nausea/slow movements improve   Free macro tracking - Lose it! And My[employer]itness[employer] apps  Noom app and next 56 days - some patients with great success  Check fasting sugar at least 2-3 days a week, goal is <130 eventually     Drink 1/2 your body weight in fluid ounces of water daily; drink a tall glass of water 30 min before meals  Don't eat until you're stuffed- listen to your stomach and eat until you are 80% full   Try eating off of a salad plate; wait 10 min after finishing before going back for seconds  Start by eating the vegetables on your plate; aim for 78% of your meals to be fruits or vegetables  Then eat your protein - lean meats (grass fed if possible), fish, beans, nuts in moderation  Eat your carbs/starch last ONLY if you still are hungry. If you can, stop before finishing it all  Avoid sugar and flour - the closer it looks to it's original form in nature, typically the better it is for you  Splurge in moderation - "assign" days when you get to splurge and have the "bad stuff" - I like to follow a 80% - 20% plan- "good" choices 80 % of the time, "bad" choices in moderation 20% of the time  Simple equation is: Calories out > calories in = weight loss - even if you eat the bad stuff, if you limit portions, you will still lose weight     Google mindful eating and here are some tips and tricks below.   Rate your hunger before you eat on a scale of 1-10, try to eat closer to a 6 or higher. And if you are at below that, why are you eating? Slow down and listen to your body.       Empagliflozin oral tablets What is  this medicine? EMPAGLIFLOZIN (EM pa gli FLOE zin) helps to treat type 2 diabetes. It helps to control blood sugar. This drug may also reduce the risk of heart attack or stroke if you have type 2 diabetes and risk factors for heart disease. Treatment is combined with diet and exercise. This medicine may be used for other purposes; ask your health care provider or pharmacist if you have questions. COMMON BRAND NAME(S): Jardiance What should I tell my health care provider before I take this medicine? They need to know if you have any of these conditions:  dehydration  diabetic ketoacidosis  diet low in salt  eating less due to illness, surgery, dieting, or any other reason  having surgery  high cholesterol  high levels of potassium in the blood  history of pancreatitis or pancreas problems  history of yeast infection of the penis or vagina  if you often drink alcohol  infections in the bladder, kidneys, or urinary tract  kidney disease  liver disease  low blood pressure  on hemodialysis  problems urinating  type 1 diabetes  uncircumcised male  an  unusual or allergic reaction to empagliflozin, other medicines, foods, dyes, or preservatives  pregnant or trying to get pregnant  breast-feeding How should I use this medicine? Take this medicine by mouth with a glass of water. Follow the directions on the prescription label. Take it in the morning, with or without food. Take your dose at the same time each day. Do not take more often than directed. Do not stop taking except on your doctor's advice. Talk to your pediatrician regarding the use of this medicine in children. Special care may be needed. Overdosage: If you think you have taken too much of this medicine contact a poison control center or emergency room at once. NOTE: This medicine is only for you. Do not share this medicine with others. What if I miss a dose? If you miss a dose, take it as soon as you can. If it  is almost time for your next dose, take only that dose. Do not take double or extra doses. What may interact with this medicine? Do not take this medicine with any of the following medications:  gatifloxacin This medicine may also interact with the following medications:  alcohol  certain medicines for blood pressure, heart disease  diuretics This list may not describe all possible interactions. Give your health care provider a list of all the medicines, herbs, non-prescription drugs, or dietary supplements you use. Also tell them if you smoke, drink alcohol, or use illegal drugs. Some items may interact with your medicine. What should I watch for while using this medicine? Visit your doctor or health care professional for regular checks on your progress. This medicine can cause a serious condition in which there is too much acid in the blood. If you develop nausea, vomiting, stomach pain, unusual tiredness, or breathing problems, stop taking this medicine and call your doctor right away. If possible, use a ketone dipstick to check for ketones in your urine. A test called the HbA1C (A1C) will be monitored. This is a simple blood test. It measures your blood sugar control over the last 2 to 3 months. You will receive this test every 3 to 6 months. Learn how to check your blood sugar. Learn the symptoms of low and high blood sugar and how to manage them. Always carry a quick-source of sugar with you in case you have symptoms of low blood sugar. Examples include hard sugar candy or glucose tablets. Make sure others know that you can choke if you eat or drink when you develop serious symptoms of low blood sugar, such as seizures or unconsciousness. They must get medical help at once. Tell your doctor or health care professional if you have high blood sugar. You might need to change the dose of your medicine. If you are sick or exercising more than usual, you might need to change the dose of your  medicine. Do not skip meals. Ask your doctor or health care professional if you should avoid alcohol. Many nonprescription cough and cold products contain sugar or alcohol. These can affect blood sugar. Wear a medical ID bracelet or chain, and carry a card that describes your disease and details of your medicine and dosage times. What side effects may I notice from receiving this medicine? Side effects that you should report to your doctor or health care professional as soon as possible:  allergic reactions like skin rash, itching or hives, swelling of the face, lips, or tongue  breathing problems  dizziness  feeling faint or lightheaded, falls  muscle weakness  nausea, vomiting, unusual stomach upset or pain  penile discharge, itching, or pain in men  signs and symptoms of a genital infection, such as fever; tenderness, redness, or swelling in the genitals or area from the genitals to the back of the rectum  signs and symptoms of low blood sugar such as feeling anxious, confusion, dizziness, increased hunger, unusually weak or tired, sweating, shakiness, cold, irritable, headache, blurred vision, fast heartbeat, loss of consciousness  signs and symptoms of a urinary tract infection, such as fever, chills, a burning feeling when urinating, blood in the urine, back pain  trouble passing urine or change in the amount of urine, including an urgent need to urinate more often, in larger amounts, or at night  unusual tiredness  vaginal discharge, itching, or odor in women Side effects that usually do not require medical attention (report to your doctor or health care professional if they continue or are bothersome):  mild increase in urination  thirsty This list may not describe all possible side effects. Call your doctor for medical advice about side effects. You may report side effects to FDA at 1-800-FDA-1088. Where should I keep my medicine? Keep out of the reach of  children. Store at room temperature between 20 and 25 degrees C (68 and 77 degrees F). Throw away any unused medicine after the expiration date. NOTE: This sheet is a summary. It may not cover all possible information. If you have questions about this medicine, talk to your doctor, pharmacist, or health care provider.  2020 Elsevier/Gold Standard (2017-06-05 10:25:34)      Who Qualifies for Obesity Medications? Although everyone is hopeful for a fast and easy way to lose weight, nothing has been shown to replace a prudent, calorie-controlled diet along with behavior modification as a cornerstone for all obesity treatments.   The next tool that can be used to achieve weight-loss and health improvement is medication.   Pharmacotherapy may be offered to Individuals affected by obesity who have failed to achieve weight-loss through diet and exercise alone.  Currently there are several drugs that are approved by the FDA for weight-loss: . phentermine products (Adipex-P or Suprenza)  . phentermine- topiramate ER (Qsymia)  . Bupropion; Naltrexone ER (Contrave)  . Saxenda (liraglutide), once a day weight loss shot .  Let's take a closer look at each of these medications and learn how they work:   Phentermine-Topiramate ER (Qsymia) How does it work? This combination medication was approved by the FDA in July 2012. Topiramate is a medication used to treat seizures. It was found that a common side effect of this medication was weight-loss. Phentermine, as described in this brochure, helps to increase your energy and decrease your appetite.  Concerns: The most common side effects were dry mouth, constipation and pins-and-needle feeling in extremities.  Qsymia should NOT be taken during pregnancy since Topiramate ER, a component of Qsymia, has been associated with an increased risk of birth defects.  MEDIATIONS SEPARATED: PHENTERMINE How does it work? Phentermine is a medication  available by prescription that works on chemicals in the brain to decrease your appetite. It also has a mild stimulant component that adds extra energy. Phentermine is a pill that is taken once a day in the morning time.   Tolerance to this medication normally develops, so it can only be used for several months at a time.  Common side effects are dry mouth, sleeplessness, constipation.  Concerns: Due to its stimulant effect, a person's blood pressure  and heart rate may increase when on this medication; therefore, you must be monitored closely by a physician who is experienced in prescribing this medication. It cannot be used in patients with some heart conditions (such as poorly controlled blood pressure), glaucoma (increased pressure in your eye), stroke or overactive thyroid. There is some concern for abuse, but this is minimal if the medication is appropriately used as directed by a healthcare professional.  TOPIRAMATE Sometimes we will prescribe topiramate AND phentermine together to make Qsymia- separating the medications makes it cheaper.  How to start it start on 1/2 pill for 3-5 nights, can increase to a whole pill for 1-2 weeks.  This medication is good for weight loss, headaches, pain This medication can cause numbness, tingling and can cause brain fog- stop if you get these Check with your eye doctor if you have a history of glaucoma and stop if you severe vision changes or blurry vision.    Bupropion; Naltrexone ER (Contrave) How does it work? Works in two areas of your brain, hunger center and reward center to reduce hunger and cravings.   Concerns Most common side effects are dry mouth, constipation or diarrhea, headache.  Please take it with a full glass of water and low fat meal.   MEDICATIONS SEPARATED: WELLBUTRIN Only antidepressant that helps with weight loss Used frequently for seasonal effective disorder If you get nausea and HA after the first week please stop- can  cause people to clench their jaw- stop it.  If you get anxious or snappy with people than stop the medication But this medication can help with energy, weight loss and mood It kicks in about 1-2 weeks And can be stopped quickly  NALTREXONE You can NOT take this medication if you are on an opioid.  It is used to treat patients that have cravings/addiction to alcohol/opioids.  Taken with supper/dinner.   SAXENDA Is an expensive once a day injectable that helps decrease appetite.  You usually have to fail other therapies and show 6 months of trying before we can get it approved.  Can cause nausea.   VYVANSE Is a stimulant that is approved for binge eating disorder.    Follow-up Visits: Patients are given the opportunity to revisit a topic or obtain more information on an area of interest during follow-up visits.  The frequency of and interval between follow-up visits is determined on a patient-by-patient basis.   Frequent visits (every 3 to 4 weeks) are encouraged until initial weight-loss goals (5 to 10 percent of body weight) are achieved.   At that point, less frequent visits are typically scheduled as needed for individual patients. However, since obesity is considered a chronic life-long problem for many individuals, periodic continual follow up is recommended.  Research has shown that weight-loss as low as 5 percent of initial body weight can lead to favorable improvements in blood pressure, cholesterol, glucose levels and insulin sensitivity. The risk of developing heart disease is reduced the most in patients who have impaired glucose tolerance, type 2 diabetes or high blood pressure.

## 2019-11-17 ENCOUNTER — Other Ambulatory Visit: Payer: Self-pay | Admitting: Adult Health

## 2019-11-17 DIAGNOSIS — R7989 Other specified abnormal findings of blood chemistry: Secondary | ICD-10-CM

## 2019-11-17 LAB — COMPLETE METABOLIC PANEL WITH GFR
AG Ratio: 1.8 (calc) (ref 1.0–2.5)
ALT: 49 U/L — ABNORMAL HIGH (ref 9–46)
AST: 26 U/L (ref 10–40)
Albumin: 5 g/dL (ref 3.6–5.1)
Alkaline phosphatase (APISO): 59 U/L (ref 36–130)
BUN: 18 mg/dL (ref 7–25)
CO2: 28 mmol/L (ref 20–32)
Calcium: 10.2 mg/dL (ref 8.6–10.3)
Chloride: 96 mmol/L — ABNORMAL LOW (ref 98–110)
Creat: 1.06 mg/dL (ref 0.60–1.35)
GFR, Est African American: 106 mL/min/{1.73_m2} (ref >=60)
GFR, Est Non African American: 91 mL/min/{1.73_m2} (ref >=60)
Globulin: 2.8 g/dL (calc) (ref 1.9–3.7)
Glucose, Bld: 292 mg/dL — ABNORMAL HIGH (ref 65–99)
Potassium: 4.4 mmol/L (ref 3.5–5.3)
Sodium: 135 mmol/L (ref 135–146)
Total Bilirubin: 0.9 mg/dL (ref 0.2–1.2)
Total Protein: 7.8 g/dL (ref 6.1–8.1)

## 2019-11-17 LAB — CBC WITH DIFFERENTIAL/PLATELET
Absolute Monocytes: 722 cells/uL (ref 200–950)
Basophils Absolute: 57 cells/uL (ref 0–200)
Basophils Relative: 0.7 %
Eosinophils Absolute: 344 cells/uL (ref 15–500)
Eosinophils Relative: 4.2 %
HCT: 46.4 % (ref 38.5–50.0)
Hemoglobin: 15.3 g/dL (ref 13.2–17.1)
Lymphs Abs: 2640 cells/uL (ref 850–3900)
MCH: 28.1 pg (ref 27.0–33.0)
MCHC: 33 g/dL (ref 32.0–36.0)
MCV: 85.1 fL (ref 80.0–100.0)
MPV: 9.6 fL (ref 7.5–12.5)
Monocytes Relative: 8.8 %
Neutro Abs: 4436 cells/uL (ref 1500–7800)
Neutrophils Relative %: 54.1 %
Platelets: 327 10*3/uL (ref 140–400)
RBC: 5.45 10*6/uL (ref 4.20–5.80)
RDW: 12.7 % (ref 11.0–15.0)
Total Lymphocyte: 32.2 %
WBC: 8.2 10*3/uL (ref 3.8–10.8)

## 2019-11-17 LAB — LIPID PANEL
Cholesterol: 190 mg/dL (ref <200)
HDL: 36 mg/dL — ABNORMAL LOW (ref >=40)
LDL Cholesterol (Calc): 106 mg/dL (calc) — ABNORMAL HIGH
Non-HDL Cholesterol (Calc): 154 mg/dL (calc) — ABNORMAL HIGH (ref <130)
Total CHOL/HDL Ratio: 5.3 (calc) — ABNORMAL HIGH (ref <5.0)
Triglycerides: 363 mg/dL — ABNORMAL HIGH (ref <150)

## 2019-11-17 LAB — VITAMIN D 25 HYDROXY (VIT D DEFICIENCY, FRACTURES): Vit D, 25-Hydroxy: 24 ng/mL — ABNORMAL LOW (ref 30–100)

## 2019-11-17 LAB — MAGNESIUM: Magnesium: 2.2 mg/dL (ref 1.5–2.5)

## 2019-11-17 LAB — HEMOGLOBIN A1C
Hgb A1c MFr Bld: 8.8 % of total Hgb — ABNORMAL HIGH (ref <5.7)
Mean Plasma Glucose: 206 (calc)
eAG (mmol/L): 11.4 (calc)

## 2019-11-17 LAB — TSH: TSH: 2.48 mIU/L (ref 0.40–4.50)

## 2019-12-24 ENCOUNTER — Other Ambulatory Visit: Payer: Self-pay | Admitting: Adult Health

## 2020-01-05 ENCOUNTER — Other Ambulatory Visit: Payer: Self-pay | Admitting: Adult Health

## 2020-01-06 ENCOUNTER — Other Ambulatory Visit: Payer: Self-pay | Admitting: Adult Health

## 2020-01-06 MED ORDER — JARDIANCE 25 MG PO TABS: 25.0000 mg | ORAL_TABLET | Freq: Every day | ORAL | 1 refills | Status: DC

## 2020-01-11 ENCOUNTER — Other Ambulatory Visit: Payer: Self-pay | Admitting: Adult Health

## 2020-02-15 ENCOUNTER — Other Ambulatory Visit: Payer: BC Managed Care – PPO

## 2020-02-18 ENCOUNTER — Ambulatory Visit: Payer: BC Managed Care – PPO | Admitting: Adult Health

## 2020-03-24 ENCOUNTER — Other Ambulatory Visit: Payer: Self-pay | Admitting: Adult Health

## 2020-03-29 ENCOUNTER — Other Ambulatory Visit: Payer: No Typology Code available for payment source

## 2020-03-29 ENCOUNTER — Other Ambulatory Visit: Payer: Self-pay

## 2020-03-29 DIAGNOSIS — E559 Vitamin D deficiency, unspecified: Secondary | ICD-10-CM

## 2020-03-29 DIAGNOSIS — E1159 Type 2 diabetes mellitus with other circulatory complications: Secondary | ICD-10-CM

## 2020-03-29 DIAGNOSIS — Z79899 Other long term (current) drug therapy: Secondary | ICD-10-CM

## 2020-03-29 DIAGNOSIS — I10 Essential (primary) hypertension: Secondary | ICD-10-CM

## 2020-03-29 DIAGNOSIS — E1169 Type 2 diabetes mellitus with other specified complication: Secondary | ICD-10-CM

## 2020-03-29 NOTE — Progress Notes (Signed)
Patient presented per his preference to obtain labs prior to OV next week.   Future Appointments  Date Time Provider Department Center  04/03/2020  4:00 PM Judd Gaudier, NP [facility] None  07/27/2020  3:00 PM Judd Gaudier, NP [facility] None  09/05/2020 11:30 AM Micki Riley, MD [facility] None

## 2020-03-30 DIAGNOSIS — E1159 Type 2 diabetes mellitus with other circulatory complications: Secondary | ICD-10-CM | POA: Diagnosis not present

## 2020-03-30 DIAGNOSIS — E785 Hyperlipidemia, unspecified: Secondary | ICD-10-CM | POA: Diagnosis not present

## 2020-03-30 DIAGNOSIS — I10 Essential (primary) hypertension: Secondary | ICD-10-CM | POA: Diagnosis not present

## 2020-03-30 DIAGNOSIS — Z79899 Other long term (current) drug therapy: Secondary | ICD-10-CM | POA: Diagnosis not present

## 2020-03-30 DIAGNOSIS — E559 Vitamin D deficiency, unspecified: Secondary | ICD-10-CM | POA: Diagnosis not present

## 2020-03-30 LAB — CBC WITH DIFFERENTIAL/PLATELET
Absolute Monocytes: 585 cells/uL (ref 200–950)
Basophils Absolute: 37 cells/uL (ref 0–200)
Basophils Relative: 0.5 %
Eosinophils Absolute: 318 cells/uL (ref 15–500)
Eosinophils Relative: 4.3 %
HCT: 45.4 % (ref 38.5–50.0)
Hemoglobin: 15.3 g/dL (ref 13.2–17.1)
Lymphs Abs: 2509 cells/uL (ref 850–3900)
MCH: 28.9 pg (ref 27.0–33.0)
MCHC: 33.7 g/dL (ref 32.0–36.0)
MCV: 85.8 fL (ref 80.0–100.0)
MPV: 9.4 fL (ref 7.5–12.5)
Monocytes Relative: 7.9 %
Neutro Abs: 3952 cells/uL (ref 1500–7800)
Neutrophils Relative %: 53.4 %
Platelets: 291 10*3/uL (ref 140–400)
RBC: 5.29 10*6/uL (ref 4.20–5.80)
RDW: 12.9 % (ref 11.0–15.0)
Total Lymphocyte: 33.9 %
WBC: 7.4 10*3/uL (ref 3.8–10.8)

## 2020-03-30 LAB — COMPLETE METABOLIC PANEL WITH GFR
AG Ratio: 1.8 (calc) (ref 1.0–2.5)
ALT: 34 U/L (ref 9–46)
AST: 17 U/L (ref 10–40)
Albumin: 5.1 g/dL (ref 3.6–5.1)
Alkaline phosphatase (APISO): 56 U/L (ref 36–130)
BUN: 18 mg/dL (ref 7–25)
CO2: 26 mmol/L (ref 20–32)
Calcium: 10.5 mg/dL — ABNORMAL HIGH (ref 8.6–10.3)
Chloride: 99 mmol/L (ref 98–110)
Creat: 0.99 mg/dL (ref 0.60–1.35)
GFR, Est African American: 115 mL/min/{1.73_m2} (ref >=60)
GFR, Est Non African American: 99 mL/min/{1.73_m2} (ref >=60)
Globulin: 2.8 g/dL (calc) (ref 1.9–3.7)
Glucose, Bld: 124 mg/dL — ABNORMAL HIGH (ref 65–99)
Potassium: 4.6 mmol/L (ref 3.5–5.3)
Sodium: 136 mmol/L (ref 135–146)
Total Bilirubin: 0.7 mg/dL (ref 0.2–1.2)
Total Protein: 7.9 g/dL (ref 6.1–8.1)

## 2020-03-30 LAB — HEMOGLOBIN A1C
Hgb A1c MFr Bld: 7.6 % of total Hgb — ABNORMAL HIGH (ref <5.7)
Mean Plasma Glucose: 171 (calc)
eAG (mmol/L): 9.5 (calc)

## 2020-03-30 LAB — LIPID PANEL
Cholesterol: 161 mg/dL (ref <200)
HDL: 40 mg/dL (ref >=40)
LDL Cholesterol (Calc): 95 mg/dL (calc)
Non-HDL Cholesterol (Calc): 121 mg/dL (calc) (ref <130)
Total CHOL/HDL Ratio: 4 (calc) (ref <5.0)
Triglycerides: 165 mg/dL — ABNORMAL HIGH (ref <150)

## 2020-03-30 LAB — MAGNESIUM: Magnesium: 2.2 mg/dL (ref 1.5–2.5)

## 2020-03-30 LAB — VITAMIN D 25 HYDROXY (VIT D DEFICIENCY, FRACTURES): Vit D, 25-Hydroxy: 38 ng/mL (ref 30–100)

## 2020-03-30 LAB — TSH: TSH: 2.76 mIU/L (ref 0.40–4.50)

## 2020-03-31 NOTE — Progress Notes (Signed)
FOLLOW UP  Assessment and Plan:   Hypertension, unspecified type Continue medication-lisinopril 20 mg daily  Addition of jardiance may improve some  Monitor blood pressure at home; call if consistently over 130/80 Continue DASH diet.   Reminder to go to the ER if any CP, SOB, nausea, dizziness, severe HA, changes vision/speech, left arm numbness and tingling and jaw pain. -     COMPLETE METABOLIC PANEL WITH GFR -     Magnesium  Type 2 diabetes mellitus with other specified complication, without long-term current use of insulin [facility]) Education: Reviewed 'ABCs' of diabetes management (respective goals in parentheses):  A1C (<7), blood pressure (<130/80), and cholesterol (LDL <70) Eye Exam yearly and Dental Exam every 6 months. Dietary recommendations Physical Activity recommendations He is doing well with 2000 mg daily metformin; strongly motivated to work on lifestyle and we are willing to work with this as long as we continue to see progress with lifestyle modification and weight loss going forward SE with ozempic and bydurion Bcise - nausea and bloating persistent; suggested senokot, short term reglan discussed and sent in today to try Discussed Jardiance; pending A1C given 10 mg tabs x 1 month and 25 mg tabs x 1 month; will defer if A1C is 9+ due to increased risk of mycotic infections NEEDS TO CHECK FASTING - minimum 2-3 days/week, keep a log -     Hemoglobin A1c   History of CVA (cerebrovascular accident) Avoid vasocontrictive meds; continue ASA;  Control blood pressure, cholesterol, glucose, increase exercise.  -     Lipid panel  Morbid obesity (HCC) Long discussion about weight loss, diet, and exercise Recommended diet heavy in fruits and veggies and low in animal meats, cheeses, and dairy products, appropriate calorie intake Patient will work on cutting down on portions, avoiding binge eating/drinking Discussed appropriate weight for height and initial goal (<300lb) - he is  confident he can achieve this in the next 3-6 months Intolerant of phentermine, topamax, GLP-1i; consider wellbutrin or naltrexone, would consider bariatric surgery  Discussed macros, portions, mindful eating; consider noom, next 56 days Follow up at next visit  Former smoker Has quit smoking Strategies for ongoing success discussed  Hyperlipidemia associated with type 2 diabetes mellitus (HCC) Continue medications: rosuvastatin 40 mg daily LDL goal <70  Continue low cholesterol diet and exercise.  Check lipid panel.  -     Lipid panel -     TSH  Vitamin D deficiency Has increased dose to 10000 IU; goal 60-100 Check vitamin D level today   ALREADY CHECKED LABS -  Continue diet and meds as discussed. Further disposition pending results of labs. Discussed med's effects and SE's.   Over 30 minutes of exam, counseling, chart review, and critical decision making was performed.   Future Appointments  Date Time Provider Department Center  07/27/2020  3:00 PM Judd Gaudier, NP [facility] None  09/05/2020 11:30 AM Micki Riley, MD [facility] None    ----------------------------------------------------------------------------------------------------------------------  HPI 34 y.o. male  presents for 3 month follow up on hypertension, cholesterol, diabetes, morbid obesity and vitamin D deficiency.   HE presented for labs prior to this visit -   He has had 2 episodes of TIA, suspected r/t vasoconstrictive meds after otherwise unremarkable workup by neurology Dr. Pearlean Brownie, concluded cerebral reversible vasoconstriction syndrome and advised to avoid sudafed, phentermine.   BMI is Body mass index is 42.27 kg/m., he reports back on diet after covid 19, walking daily.  Did a 5k run with wife, has  been very supportive, has another 1/2 marathon planned this fall.  Intolerant topamax in the past (memory), phentermine contraindicated Down 10 lb since last visit   Goal is <300 lb by next visit  per patient, then <275lb next year.  Diet irregular - salads etc, but admits eating bad snacks at work, needs to do better  Taking small snack with him in truck to work [employer] intake: over a gallon daily, or unsweet tea, black coffee, rare soda Alcohol: minimal in the last 3 months Carb intake is variable but trying to cut back, occasionally has to stop at subway, etc if desperate on road; chooses whole wheat Wt Readings from Last 3 Encounters:  04/03/20 (!) 316 lb (143.3 kg)  11/16/19 (!) 326 lb 12.8 oz (148.2 kg)  07/27/19 (!) 318 lb (144.2 kg)   His blood pressure has been controlled at home (125-130/80s), today their BP is BP: 132/86  He does workout. He denies chest pain, shortness of breath, dizziness.   He is on cholesterol medication Rosuvastatin 40 mg daily and denies myalgias. His cholesterol is not at goal. The cholesterol last visit was:   Lab Results  Component Value Date   CHOL 161 03/29/2020   HDL 40 03/29/2020   LDLCALC 95 03/29/2020   TRIG 165 (H) 03/29/2020   CHOLHDL 4.0 03/29/2020    He has been working on diet and exercise for T2DM newly on metformin 2000 mg daily, (SE with bydureon, ozempic), on jardiance 25 mg working aggressively on lifestyle per patient preference, and denies increased appetite, nausea, paresthesia of the feet, polydipsia, polyuria, visual disturbances and vomiting. He reports when fasting typically 100-120 Last in the office was:  Lab Results  Component Value Date   HGBA1C 7.6 (H) 03/29/2020    Last GFR:  Lab Results  Component Value Date   GFRNONAA 99 03/29/2020   Patient is on Vitamin D supplement, taking 28413 IU since the last visit.   Lab Results  Component Value Date   VD25OH 38 03/29/2020      Current Medications:  Current Outpatient Medications on File Prior to Visit  Medication Sig  . Cholecalciferol (VITAMIN D3) 250 MCG (10000 UT) capsule Take 10,000 Units by mouth daily.  . empagliflozin (JARDIANCE) 25 MG TABS tablet Take  25 mg by mouth daily before breakfast.  . famotidine (PEPCID) 20 MG tablet Take 1 tablet 2 x  /day for Indigestion & Heartburn  . fluticasone (FLONASE) 50 MCG/ACT nasal spray INSTILL TWO SPRAYS IN EACH NOSTRIL TWICE A DAY  . glucose blood (FREESTYLE LITE) test strip Test sugar once daily  . lisinopril (ZESTRIL) 20 MG tablet TAKE 1 TABLET DAILY FOR BLOOD PRESSURE  . metFORMIN (GLUCOPHAGE) 500 MG tablet TAKE TWO TABLETS BY MOUTH WITH LARGEST MEAL OF THE DAY AND TAKE ONE TABLET WITH EACH OTHER MEAL FOR A TOTAL OF 4 DAILY  . rosuvastatin (CRESTOR) 40 MG tablet Take 1 tablet (40 mg total) by mouth daily. for cholesterol  . aspirin 81 MG chewable tablet Chew 81 mg by mouth daily.  (Patient not taking: Reported on 04/03/2020)  . loratadine (CLARITIN) 10 MG tablet Take 10 mg by mouth daily as needed.  (Patient not taking: Reported on 04/03/2020)  . metoCLOPramide (REGLAN) 10 MG tablet Take 1 tablet (10 mg total) by mouth 3 (three) times daily with meals. For nausea/bloating/acid reflux. Take as needed until short term symptoms resolve.   No current facility-administered medications on file prior to visit.     Allergies:  Allergies  Allergen Reactions  . Codeine Swelling     Medical History:  Past Medical History:  Diagnosis Date  . Diabetes mellitus without complication ([place])   . Hypertension   . Morbid obesity ([place])   . Stroke [facility])    "mini stroke"   Family history- Reviewed and unchanged Social history- Reviewed and unchanged   Review of Systems:  Review of Systems  Constitutional: Negative for malaise/fatigue and weight loss.  HENT: Negative for hearing loss and tinnitus.   Eyes: Negative for blurred vision and double vision.  Respiratory: Negative for cough, shortness of breath and wheezing.   Cardiovascular: Negative for chest pain, palpitations, orthopnea, claudication and leg swelling.  Gastrointestinal: Negative for abdominal pain, blood in stool, constipation, diarrhea,  heartburn, melena, nausea and vomiting.  Genitourinary: Negative.   Musculoskeletal: Negative for joint pain and myalgias.  Skin: Negative for rash.  Neurological: Negative for dizziness, tingling, sensory change, weakness and headaches.  Endo/Heme/Allergies: Negative for polydipsia.  Psychiatric/Behavioral: Negative.   All other systems reviewed and are negative.   Physical Exam: BP 132/86   Pulse 89   Temp (!) 96.6 F (35.9 C)   Wt (!) 316 lb (143.3 kg)   SpO2 94%   BMI 42.27 kg/m  Wt Readings from Last 3 Encounters:  04/03/20 (!) 316 lb (143.3 kg)  11/16/19 (!) 326 lb 12.8 oz (148.2 kg)  07/27/19 (!) 318 lb (144.2 kg)   General Appearance: Well nourished, well dressed, morbidly obese young male in no apparent distress. Eyes: PERRLA, EOMs, conjunctiva no swelling or erythema Sinuses: No Frontal/maxillary tenderness ENT/Mouth: Ext aud canals clear, TMs without erythema, bulging. Mask in place; oral exam deferred. Hearing normal.  Neck: Supple, thyroid normal.  Respiratory: Respiratory effort normal, BS equal bilaterally without rales, rhonchi, wheezing or stridor.  Cardio: RRR with no MRGs. Brisk peripheral pulses without edema.  Abdomen: Soft, obese abdomen, + BS.  Non tender, no guarding, rebound, hernias, masses. Lymphatics: Non tender without lymphadenopathy.  Musculoskeletal: Full ROM, 5/5 strength, Normal gait Skin: Warm, dry without rashes, lesions, ecchymosis.  Neuro: Cranial nerves intact. No cerebellar symptoms.  Psych: Awake and oriented X 3, normal affect, Insight and Judgment appropriate.    Izora Ribas, NP 4:21 PM [facility]

## 2020-04-03 ENCOUNTER — Ambulatory Visit: Payer: BC Managed Care – PPO | Admitting: Adult Health

## 2020-04-03 ENCOUNTER — Other Ambulatory Visit: Payer: Self-pay

## 2020-04-03 ENCOUNTER — Encounter: Payer: Self-pay | Admitting: Adult Health

## 2020-04-03 VITALS — BP 132/86 | HR 89 | Temp 96.6°F | Wt 316.0 lb

## 2020-04-03 DIAGNOSIS — I10 Essential (primary) hypertension: Secondary | ICD-10-CM | POA: Diagnosis not present

## 2020-04-03 DIAGNOSIS — K219 Gastro-esophageal reflux disease without esophagitis: Secondary | ICD-10-CM

## 2020-04-03 DIAGNOSIS — E785 Hyperlipidemia, unspecified: Secondary | ICD-10-CM

## 2020-04-03 DIAGNOSIS — E1169 Type 2 diabetes mellitus with other specified complication: Secondary | ICD-10-CM | POA: Diagnosis not present

## 2020-04-03 DIAGNOSIS — R7989 Other specified abnormal findings of blood chemistry: Secondary | ICD-10-CM

## 2020-04-03 DIAGNOSIS — E1159 Type 2 diabetes mellitus with other circulatory complications: Secondary | ICD-10-CM | POA: Diagnosis not present

## 2020-04-03 DIAGNOSIS — Z8673 Personal history of transient ischemic attack (TIA), and cerebral infarction without residual deficits: Secondary | ICD-10-CM

## 2020-04-03 MED ORDER — EMPAGLIFLOZIN 25 MG PO TABS: 25.0000 mg | ORAL_TABLET | Freq: Every day | ORAL | 1 refills | Status: DC

## 2020-04-03 NOTE — Patient Instructions (Addendum)
Goals    . Blood Pressure < 130/80    . DIET - INCREASE WATER INTAKE     80-100 fluid ounces    . Exercise 150 min/wk Moderate Activity    . HEMOGLOBIN A1C < 7.0    . LDL CALC < 70    . Weight (lb) < 275 lb (124.7 kg)        High-Fiber Diet Fiber, also called dietary fiber, is a type of carbohydrate that is found in fruits, vegetables, whole grains, and beans. A high-fiber diet can have many health benefits. Your health care provider may recommend a high-fiber diet to help:  Prevent constipation. Fiber can make your bowel movements more regular.  Lower your cholesterol.  Relieve the following conditions: ? Swelling of veins in the anus (hemorrhoids). ? Swelling and irritation (inflammation) of specific areas of the digestive tract (uncomplicated diverticulosis). ? A problem of the large intestine (colon) that sometimes causes pain and diarrhea (irritable bowel syndrome, IBS).  Prevent overeating as part of a weight-loss plan.  Prevent heart disease, type 2 diabetes, and certain cancers. What is my plan? The recommended daily fiber intake in grams (g) includes:  38 g for men age 59 or younger.  30 g for men over age 37.  25 g for women age 12 or younger.  21 g for women over age 56. You can get the recommended daily intake of dietary fiber by:  Eating a variety of fruits, vegetables, grains, and beans.  Taking a fiber supplement, if it is not possible to get enough fiber through your diet. What do I need to know about a high-fiber diet?  It is better to get fiber through food sources rather than from fiber supplements. There is not a lot of research about how effective supplements are.  Always check the fiber content on the nutrition facts label of any prepackaged food. Look for foods that contain 5 g of fiber or more per serving.  Talk with a diet and nutrition specialist (dietitian) if you have questions about specific foods that are recommended or not recommended  for your medical condition, especially if those foods are not listed below.  Gradually increase how much fiber you consume. If you increase your intake of dietary fiber too quickly, you may have bloating, cramping, or gas.  Drink plenty of water. Water helps you to digest fiber. What are tips for following this plan?  Eat a wide variety of high-fiber foods.  Make sure that half of the grains that you eat each day are whole grains.  Eat breads and cereals that are made with whole-grain flour instead of refined flour or white flour.  Eat brown rice, bulgur wheat, or millet instead of white rice.  Start the day with a breakfast that is high in fiber, such as a cereal that contains 5 g of fiber or more per serving.  Use beans in place of meat in soups, salads, and pasta dishes.  Eat high-fiber snacks, such as berries, raw vegetables, nuts, and popcorn.  Choose whole fruits and vegetables instead of processed forms like juice or sauce. What foods can I eat?  Fruits Berries. Pears. Apples. Oranges. Avocado. Prunes and raisins. Dried figs. Vegetables Sweet potatoes. Spinach. Kale. Artichokes. Cabbage. Broccoli. Cauliflower. Green peas. Carrots. Squash. Grains Whole-grain breads. Multigrain cereal. Oats and oatmeal. Brown rice. Barley. Bulgur wheat. Millet. Quinoa. Bran muffins. Popcorn. Rye wafer crackers. Meats and other proteins Navy, kidney, and pinto beans. Soybeans. Split peas. Lentils. Nuts  and seeds. Dairy Fiber-fortified yogurt. Beverages Fiber-fortified soy milk. Fiber-fortified orange juice. Other foods Fiber bars. The items listed above may not be a complete list of recommended foods and beverages. Contact a dietitian for more options. What foods are not recommended? Fruits Fruit juice. Cooked, strained fruit. Vegetables Fried potatoes. Canned vegetables. Well-cooked vegetables. Grains White bread. Pasta made with refined flour. White rice. Meats and other  proteins Fatty cuts of meat. Fried chicken or fried fish. Dairy Milk. Yogurt. Cream cheese. Sour cream. Fats and oils Butters. Beverages Soft drinks. Other foods Cakes and pastries. The items listed above may not be a complete list of foods and beverages to avoid. Contact a dietitian for more information. Summary  Fiber is a type of carbohydrate. It is found in fruits, vegetables, whole grains, and beans.  There are many health benefits of eating a high-fiber diet, such as preventing constipation, lowering blood cholesterol, helping with weight loss, and reducing your risk of heart disease, diabetes, and certain cancers.  Gradually increase your intake of fiber. Increasing too fast can result in cramping, bloating, and gas. Drink plenty of water while you increase your fiber.  The best sources of fiber include whole fruits and vegetables, whole grains, nuts, seeds, and beans. This information is not intended to replace advice given to you by your health care provider. Make sure you discuss any questions you have with your health care provider. Document Revised: 07/28/2017 Document Reviewed: 07/28/2017 Elsevier Patient Education  2020 [employer].

## 2020-05-15 ENCOUNTER — Other Ambulatory Visit: Payer: Self-pay | Admitting: Adult Health

## 2020-06-23 ENCOUNTER — Other Ambulatory Visit: Payer: Self-pay | Admitting: Adult Health

## 2020-07-13 ENCOUNTER — Encounter: Payer: Self-pay | Admitting: Adult Health

## 2020-07-27 ENCOUNTER — Other Ambulatory Visit: Payer: Self-pay

## 2020-07-27 ENCOUNTER — Encounter: Payer: Self-pay | Admitting: Adult Health

## 2020-07-27 ENCOUNTER — Ambulatory Visit (INDEPENDENT_AMBULATORY_CARE_PROVIDER_SITE_OTHER): Payer: BC Managed Care – PPO | Admitting: Adult Health

## 2020-07-27 VITALS — BP 146/88 | HR 92 | Temp 96.4°F | Ht 72.5 in | Wt 327.8 lb

## 2020-07-27 DIAGNOSIS — E1169 Type 2 diabetes mellitus with other specified complication: Secondary | ICD-10-CM

## 2020-07-27 DIAGNOSIS — E559 Vitamin D deficiency, unspecified: Secondary | ICD-10-CM

## 2020-07-27 DIAGNOSIS — K219 Gastro-esophageal reflux disease without esophagitis: Secondary | ICD-10-CM

## 2020-07-27 DIAGNOSIS — Z87891 Personal history of nicotine dependence: Secondary | ICD-10-CM

## 2020-07-27 DIAGNOSIS — I10 Essential (primary) hypertension: Secondary | ICD-10-CM | POA: Diagnosis not present

## 2020-07-27 DIAGNOSIS — Z1389 Encounter for screening for other disorder: Secondary | ICD-10-CM

## 2020-07-27 DIAGNOSIS — Z136 Encounter for screening for cardiovascular disorders: Secondary | ICD-10-CM | POA: Diagnosis not present

## 2020-07-27 DIAGNOSIS — Z13220 Encounter for screening for lipoid disorders: Secondary | ICD-10-CM | POA: Diagnosis not present

## 2020-07-27 DIAGNOSIS — Z0000 Encounter for general adult medical examination without abnormal findings: Secondary | ICD-10-CM

## 2020-07-27 DIAGNOSIS — Z8673 Personal history of transient ischemic attack (TIA), and cerebral infarction without residual deficits: Secondary | ICD-10-CM

## 2020-07-27 DIAGNOSIS — R7989 Other specified abnormal findings of blood chemistry: Secondary | ICD-10-CM

## 2020-07-27 DIAGNOSIS — Z23 Encounter for immunization: Secondary | ICD-10-CM

## 2020-07-27 DIAGNOSIS — Z1159 Encounter for screening for other viral diseases: Secondary | ICD-10-CM

## 2020-07-27 DIAGNOSIS — Z79899 Other long term (current) drug therapy: Secondary | ICD-10-CM | POA: Diagnosis not present

## 2020-07-27 DIAGNOSIS — Z131 Encounter for screening for diabetes mellitus: Secondary | ICD-10-CM

## 2020-07-27 DIAGNOSIS — E1159 Type 2 diabetes mellitus with other circulatory complications: Secondary | ICD-10-CM | POA: Diagnosis not present

## 2020-07-27 DIAGNOSIS — Z8616 Personal history of COVID-19: Secondary | ICD-10-CM

## 2020-07-27 DIAGNOSIS — I6521 Occlusion and stenosis of right carotid artery: Secondary | ICD-10-CM | POA: Diagnosis not present

## 2020-07-27 DIAGNOSIS — Z9109 Other allergy status, other than to drugs and biological substances: Secondary | ICD-10-CM

## 2020-07-27 DIAGNOSIS — E119 Type 2 diabetes mellitus without complications: Secondary | ICD-10-CM

## 2020-07-27 DIAGNOSIS — Z1329 Encounter for screening for other suspected endocrine disorder: Secondary | ICD-10-CM

## 2020-07-27 MED ORDER — LOSARTAN POTASSIUM 50 MG PO TABS: 50.0000 mg | ORAL_TABLET | Freq: Every day | ORAL | 3 refills | Status: DC

## 2020-07-27 NOTE — Patient Instructions (Addendum)
[name] , Thank you for taking time to come for your Annual Wellness Visit. I appreciate your ongoing commitment to your health goals. Please review the following plan we discussed and let me know if I can assist you in the future.   These are the goals we discussed: Goals    . Blood Pressure < 130/80    . DIET - INCREASE WATER INTAKE     80-100 fluid ounces    . Exercise 150 min/wk Moderate Activity    . HEMOGLOBIN A1C < 7.0    . LDL CALC < 70    . Weight (lb) < 275 lb (124.7 kg)       This is a list of the screening recommended for you and due dates:  Health Maintenance  Topic Date Due  .  Hepatitis C: One time screening is recommended by Center for Disease Control  (CDC) for  adults born from [birth year] through 1966.   Never done  . Pneumococcal vaccine  Never done  . Eye exam for diabetics  Never done  . Flu Shot  05/07/2020  . Urine Protein Check  07/26/2020  . COVID-19 Vaccine (1) 08/12/2020*  . Hemoglobin A1C  09/28/2020  . Complete foot exam   07/27/2021  . Tetanus Vaccine  10/26/2028  . HIV Screening  Discontinued  *Topic was postponed. The date shown is not the original due date.    Recommend tracking exercise, weight, intake with app - [employer] is pretty good Commit to weighing once weekly and writing down to keep a log Accountability partner may also be helpful    Know what a healthy weight is for you (roughly BMI <25) and aim to maintain this  Aim for 7+ servings of fruits and vegetables daily  65-80+ fluid ounces of water or unsweet tea for healthy kidneys  Limit to max 1 drink of alcohol per day; avoid smoking/tobacco  Limit animal fats in diet for cholesterol and heart health - choose grass fed whenever available  Avoid highly processed foods, and foods high in saturated/trans fats  Aim for low stress - take time to unwind and care for your mental health  Aim for 150 min of moderate intensity exercise weekly for heart health, and weights twice  weekly for bone health  Aim for 7-9 hours of sleep daily      High-Fiber Diet Fiber, also called dietary fiber, is a type of carbohydrate that is found in fruits, vegetables, whole grains, and beans. A high-fiber diet can have many health benefits. Your health care provider may recommend a high-fiber diet to help:  Prevent constipation. Fiber can make your bowel movements more regular.  Lower your cholesterol.  Relieve the following conditions: ? Swelling of veins in the anus (hemorrhoids). ? Swelling and irritation (inflammation) of specific areas of the digestive tract (uncomplicated diverticulosis). ? A problem of the large intestine (colon) that sometimes causes pain and diarrhea (irritable bowel syndrome, IBS).  Prevent overeating as part of a weight-loss plan.  Prevent heart disease, type 2 diabetes, and certain cancers. What is my plan? The recommended daily fiber intake in grams (g) includes:  38 g for men age 50 or younger.  30 g for men over age 42.  25 g for women age 90 or younger.  21 g for women over age 66. You can get the recommended daily intake of dietary fiber by:  Eating a variety of fruits, vegetables, grains, and beans.  Taking a fiber supplement, if it  is not possible to get enough fiber through your diet. What do I need to know about a high-fiber diet?  It is better to get fiber through food sources rather than from fiber supplements. There is not a lot of research about how effective supplements are.  Always check the fiber content on the nutrition facts label of any prepackaged food. Look for foods that contain 5 g of fiber or more per serving.  Talk with a diet and nutrition specialist (dietitian) if you have questions about specific foods that are recommended or not recommended for your medical condition, especially if those foods are not listed below.  Gradually increase how much fiber you consume. If you increase your intake of dietary fiber  too quickly, you may have bloating, cramping, or gas.  Drink plenty of water. Water helps you to digest fiber. What are tips for following this plan?  Eat a wide variety of high-fiber foods.  Make sure that half of the grains that you eat each day are whole grains.  Eat breads and cereals that are made with whole-grain flour instead of refined flour or white flour.  Eat brown rice, bulgur wheat, or millet instead of white rice.  Start the day with a breakfast that is high in fiber, such as a cereal that contains 5 g of fiber or more per serving.  Use beans in place of meat in soups, salads, and pasta dishes.  Eat high-fiber snacks, such as berries, raw vegetables, nuts, and popcorn.  Choose whole fruits and vegetables instead of processed forms like juice or sauce. What foods can I eat?  Fruits Berries. Pears. Apples. Oranges. Avocado. Prunes and raisins. Dried figs. Vegetables Sweet potatoes. Spinach. Kale. Artichokes. Cabbage. Broccoli. Cauliflower. Green peas. Carrots. Squash. Grains Whole-grain breads. Multigrain cereal. Oats and oatmeal. Brown rice. Barley. Bulgur wheat. Millet. Quinoa. Bran muffins. Popcorn. Rye wafer crackers. Meats and other proteins Navy, kidney, and pinto beans. Soybeans. Split peas. Lentils. Nuts and seeds. Dairy Fiber-fortified yogurt. Beverages Fiber-fortified soy milk. Fiber-fortified orange juice. Other foods Fiber bars. The items listed above may not be a complete list of recommended foods and beverages. Contact a dietitian for more options. What foods are not recommended? Fruits Fruit juice. Cooked, strained fruit. Vegetables Fried potatoes. Canned vegetables. Well-cooked vegetables. Grains White bread. Pasta made with refined flour. White rice. Meats and other proteins Fatty cuts of meat. Fried chicken or fried fish. Dairy Milk. Yogurt. Cream cheese. Sour cream. Fats and oils Butters. Beverages Soft drinks. Other foods Cakes and  pastries. The items listed above may not be a complete list of foods and beverages to avoid. Contact a dietitian for more information. Summary  Fiber is a type of carbohydrate. It is found in fruits, vegetables, whole grains, and beans.  There are many health benefits of eating a high-fiber diet, such as preventing constipation, lowering blood cholesterol, helping with weight loss, and reducing your risk of heart disease, diabetes, and certain cancers.  Gradually increase your intake of fiber. Increasing too fast can result in cramping, bloating, and gas. Drink plenty of water while you increase your fiber.  The best sources of fiber include whole fruits and vegetables, whole grains, nuts, seeds, and beans. This information is not intended to replace advice given to you by your health care provider. Make sure you discuss any questions you have with your health care provider. Document Revised: 07/28/2017 Document Reviewed: 07/28/2017 Elsevier Patient Education  2020 [employer].

## 2020-07-27 NOTE — Progress Notes (Signed)
Complete Physical  Assessment and Plan:  Jared Ramsey was seen today for establish care.  Diagnoses and all orders for this visit:  Encounter for medical examination to establish care  Hypertension, unspecified type Start new medication - losartan 50 mg daily, recheck 2-4 weeks  Monitor blood pressure at home; call if consistently over 130/80 Continue DASH diet.   Reminder to go to the ER if any CP, SOB, nausea, dizziness, severe HA, changes vision/speech, left arm numbness and tingling and jaw pain. -     CBC with Differential/Platelet -     COMPLETE METABOLIC PANEL WITH GFR -     Magnesium  Type 2 diabetes mellitus with other specified complication, without long-term current use of insulin [facility]) Education: Reviewed 'ABCs' of diabetes management (respective goals in parentheses):  A1C (<7), blood pressure (<130/80), and cholesterol (LDL <70) Eye Exam yearly and Dental Exam every 6 months. Dietary recommendations Physical Activity recommendations -     Hemoglobin A1c -     Urinalysis, Routine w reflex microscopic  History of CVA (cerebrovascular accident) Has seen neurology Dr. Leonie Man  Attributed to vasoactive medication; avoid vasoconstrictive agents, sudafed, phentermiene Continue ASA No suspected recurrence Control blood pressure, cholesterol, glucose, increase exercise.  -     Lipid panel  Morbid obesity ([place]) Long discussion about weight loss, diet, and exercise Recommended diet heavy in fruits and veggies and low in animal meats, cheeses, and dairy products, appropriate calorie intake Patient will work on cutting down on portions, avoiding binge eating/drinking Discussed appropriate weight for height and initial goal (<300lb) Couldn't tolerate ozempic, pending labs consider bydurion Bcise  Follow up at next visit  Environmental allergies Continue OTC allergy pills  Alcohol consumption binge drinking Has stopped drinking- discussed avoiding binge drinking, max 1-2 drinks  per sitting  Former smoker Has quit smoking Strategies for ongoing success discussed  Right carotid atherosclerosis Control blood pressure, cholesterol, glucose, increase exercise.  -     Lipid panel  Hyperlipidemia associated with type 2 diabetes mellitus ([place]) Continue medications: rosuvastatin 40 mg daily LDL goal <70, wants to work on lifestyle and avoid adding second med Continue low cholesterol diet and exercise.  Check lipid panel.  -     Lipid panel -     TSH  GERD Well managed on current medications;  Discussed diet, avoiding triggers and other lifestyle changes  Orders Placed This Encounter  Procedures  . FLU VACCINE MDCK QUAD W/Preservative  . CBC with Differential/Platelet  . COMPLETE METABOLIC PANEL WITH GFR  . Magnesium  . Lipid panel  . TSH  . Hemoglobin A1c  . VITAMIN D 25 Hydroxy (Vit-D Deficiency, Fractures)  . Microalbumin / creatinine urine ratio  . Urinalysis, Routine w reflex microscopic  . SARS-CoV-2 Antibody(IgG)Spike,Semi-Quantitative  . Hepatitis C antibody  . Ambulatory referral to Ophthalmology  . EKG 12-Lead  . HM DIABETES FOOT EXAM    Discussed med's effects and SE's. Screening labs and tests as requested with regular follow-up as recommended. Over 40 minutes of exam, counseling, chart review and critical decision making was performed   Future Appointments  Date Time Provider [place]  09/05/2020 11:30 AM Garvin Fila, MD [facility] None  07/30/2021  3:00 PM Liane Comber, NP [facility] None    HPI BP (!) 146/88   Pulse 92   Temp (!) 96.4 F (35.8 C)   Ht 6' 0.5" (1.842 m)   Wt (!) 327 lb 12.8 oz (148.7 kg)   SpO2 97%   BMI 43.85  kg/m   34 y.o. male patient presents for CPE. Jared Ramsey has Hypertension; History of CVA (cerebrovascular accident); Morbid obesity ([place]); Environmental allergies; Former smoker (quit 07/2018, 7.5 pack year history); Stenosis of right carotid artery; Type 2 diabetes mellitus with circulatory  disorder ([name]); Hyperlipidemia associated with type 2 diabetes mellitus ([name]); Acid reflux; History of COVID-19; and LFT elevation on their problem list.   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 Just moved to new home, busy few months.   In 07/2018 Jared Ramsey was evaluated in ED for right sided headache and extremity heaviness. CTA head showed Subtle low-density in the superior paramedian right cerebellum, compared to MRI in 2017. CTA showed Mild noncalcified plaque at the right common carotid bifurcationand left vertebral origin. Jared Ramsey was evaluated by Dr. Leonie Man and concluded episode of left hemispheric TIA in October 2019 likely from vasoconstriction from Sudafed use.  Prior history of likely right cerebellar infarct in 2017 secondary to cerebral reversible vasoconstriction syndrome. Jared Ramsey was recommended aggressive lifestyle modification and avoidance of vasoconstrictive agents, sudafed, phentermiene.  History of binge drinking (12-24 on weekend), significantly reduced, now max 6 in a week.  Former smoker; 7.5 pack year history, quit in 07/2018 and doing well.   Jared Ramsey has pepcid 20 mg daily, 2nd PRN for reflux and doing well. Very rarely takes reglan.   BMI is Body mass index is 43.85 kg/m., Jared Ramsey has not been working on diet/exericse with moving house. Plans to restart, just got elliptical.  Weight goal 300 lb prior to baby is born.   Wt Readings from Last 3 Encounters:  07/27/20 (!) 327 lb 12.8 oz (148.7 kg)  04/03/20 (!) 316 lb (143.3 kg)  11/16/19 (!) 326 lb 12.8 oz (148.2 kg)   His blood pressure has been controlled at home, today their BP is BP: (!) 146/88 Jared Ramsey does workout. Jared Ramsey denies chest pain, shortness of breath, dizziness.   Jared Ramsey is on cholesterol medication (rosuvastatin 40 mg daily) and denies myalgias.  His cholesterol is not at LDL goal of <70 though previously has been near goal with current agent.The cholesterol last visit  was:   Lab Results  Component Value Date   CHOL 161 03/29/2020   HDL 40 03/29/2020   LDLCALC 95 03/29/2020   TRIG 165 (H) 03/29/2020   CHOLHDL 4.0 03/29/2020    Jared Ramsey has been working on diet and exercise for T2DM with circulatory complication, treated by metformin 2000 mg daily, and jardiance 25 mg, had nausea with ozempic, trulicity at starting dose and was unable to tolerate, Jared Ramsey is on bASA, Jared Ramsey is on ACE/ARB and denies foot ulcerations, increased appetite, nausea, polydipsia, polyuria, visual disturbances and vomiting. Admits hasn't been checking fasting glucose  Last A1C in the office was:  Lab Results  Component Value Date   HGBA1C 7.6 (H) 03/29/2020    Last GFR: Lab Results  Component Value Date   GFRNONAA 99 03/29/2020   Taking 10000 IU  Lab Results  Component Value Date   VD25OH 38 03/29/2020        Current Medications:  Current Outpatient Medications on File Prior to Visit  Medication Sig Dispense Refill  . aspirin 81 MG chewable tablet Chew 81 mg by mouth daily.     . Cholecalciferol (VITAMIN D3) 250 MCG (10000 UT) capsule Take 10,000 Units by mouth daily.    . empagliflozin (JARDIANCE) 25 MG TABS tablet Take 1 tablet (25 mg  total) by mouth daily before breakfast. 90 tablet 1  . famotidine (PEPCID) 20 MG tablet Take 1 tablet 2 x  /day for Indigestion & Heartburn 180 tablet 0  . fluticasone (FLONASE) 50 MCG/ACT nasal spray INSTILL TWO SPRAYS IN EACH NOSTRIL TWICE A DAY 48 g 3  . glucose blood (FREESTYLE LITE) test strip Test sugar once daily 50 each 12  . lisinopril (ZESTRIL) 20 MG tablet Take    1 tablet     Daily     for BP 90 tablet 0  . loratadine (CLARITIN) 10 MG tablet Take 10 mg by mouth daily as needed.     . metFORMIN (GLUCOPHAGE) 500 MG tablet TAKE TWO TABLETS BY MOUTH WITH LARGEST MEAL OF THE DAY AND TAKE ONE TABLET WITH EACH OTHER MEAL FOR A TOTAL OF 4 DAILY 360 tablet 0  . metoCLOPramide (REGLAN) 10 MG tablet Take 1 tablet (10 mg total) by mouth 3 (three)  times daily with meals. For nausea/bloating/acid reflux. Take as needed until short term symptoms resolve. 90 tablet 0  . rosuvastatin (CRESTOR) 40 MG tablet TAKE ONE TABLET BY MOUTH DAILY FOR CHOLESTEROL 90 tablet 0   No current facility-administered medications on file prior to visit.   Allergies:  Allergies  Allergen Reactions  . Codeine Swelling   Health Maintenance:  Immunization History  Administered Date(s) Administered  . Hepatitis A 07/11/2008, 05/30/2009  . Hepatitis B 08/23/1997, 01/24/1998, 07/19/1998  . IPV 07/21/2008  . Influenza Inj Mdck Quad With Preservative 07/27/2019  . MMR 08/23/1997  . Td 01/24/1998, 10/26/2018  . Tdap 07/11/2008  . Typhoid Live 07/11/2008    Tetanus: 2020 Pneumovax:  Flu vaccine: DUE, TODAY  HPV: declines Covid 19: declines today  Sleep study: 08/2018 normal  CTA head/neck: 07/2018 Mild noncalcified plaque at the right common carotid bifurcation and left vertebral origin. There is related 50% narrowing at the left vertebral origin. MRI brain: 07/2018 Colonoscopy: n/a EGD: n/a  Eye Exam: OVERDUE, Jared Ramsey will schedule diabetic eye  Dentist: [place], last visit 2020, goes q20m  Patient Care Team: MUnk Pinto MD as PCP - General (Internal Medicine) SGarvin Fila MD as Consulting Physician (Neurology)  Medical History:  has Hypertension; History of CVA (cerebrovascular accident); Morbid obesity ([place]; Environmental allergies; Former smoker (quit 07/2018, 7.5 pack year history); Stenosis of right carotid artery; Type 2 diabetes mellitus with circulatory disorder ([place]; Hyperlipidemia associated with type 2 diabetes mellitus ([place]; Acid reflux; History of COVID-19; and LFT elevation on their problem list. Surgical History:  Jared Ramsey  has a past surgical history that includes Tonsillectomy and Plantar's wart excision (Left, 2003). Family History:  His family history includes Alzheimer's disease in his maternal grandmother and paternal  grandmother; Diabetes in his maternal grandmother, mother, and sister; Emphysema in his paternal grandmother; Epilepsy in his sister; Hyperlipidemia in his father and paternal grandfather; Hypertension in his father, maternal grandmother, mother, paternal grandfather, and sister; Lymphoma in his mother. Social History:   reports that Jared Ramsey quit smoking about 2 years ago. His smoking use included cigarettes. Jared Ramsey has a 7.50 pack-year smoking history. Jared Ramsey has never used smokeless tobacco. Jared Ramsey reports current alcohol use. Jared Ramsey reports that Jared Ramsey does not use drugs. Review of Systems:  Review of Systems  Constitutional: Negative for malaise/fatigue and weight loss.  HENT: Negative for hearing loss and tinnitus.   Eyes: Negative for blurred vision and double vision.  Respiratory: Negative for cough, shortness of breath and wheezing.   Cardiovascular: Negative for chest pain,  palpitations, orthopnea, claudication and leg swelling.  Gastrointestinal: Negative for abdominal pain, blood in stool, constipation, diarrhea, heartburn, melena, nausea and vomiting.  Genitourinary: Negative.   Musculoskeletal: Negative for joint pain and myalgias.  Skin: Negative for rash.  Neurological: Negative for dizziness, tingling, sensory change, weakness and headaches.  Endo/Heme/Allergies: Negative for polydipsia.  Psychiatric/Behavioral: Negative for depression, memory loss and substance abuse. The patient is not nervous/anxious and does not have insomnia.   All other systems reviewed and are negative.   Physical Exam: Estimated body mass index is 43.85 kg/m as calculated from the following:   Height as of this encounter: 6' 0.5" (1.842 m).   Weight as of this encounter: 327 lb 12.8 oz (148.7 kg). BP (!) 146/88   Pulse 92   Temp (!) 96.4 F (35.8 C)   Ht 6' 0.5" (1.842 m)   Wt (!) 327 lb 12.8 oz (148.7 kg)   SpO2 97%   BMI 43.85 kg/m  General Appearance: Well nourished, in no apparent distress.  Eyes: PERRLA, EOMs,  conjunctiva no swelling or erythema, Sinuses: No Frontal/maxillary tenderness  ENT/Mouth: Ext aud canals clear, normal light reflex with TMs without erythema, bulging. Good dentition. No erythema, swelling, or exudate on post pharynx. Tonsils not swollen or erythematous. Hearing normal.  Neck: Supple, thyroid normal. No bruits  Respiratory: Respiratory effort normal, BS equal bilaterally without rales, rhonchi, wheezing or stridor.  Cardio: RRR without murmurs, rubs or gallops. Brisk peripheral pulses without edema.  Chest: symmetric, with normal excursions and percussion.  Abdomen: Soft, mildly tender in RUQ, no guarding, rebound, palpable hernias, masses, or organomegaly.  Lymphatics: Non tender without lymphadenopathy.  Genitourinary: Declines Musculoskeletal: Full ROM all peripheral extremities,5/5 strength, and normal gait.  Skin: Warm, dry without rashes, lesions, ecchymosis. Neuro: Cranial nerves intact, reflexes equal bilaterally. Normal muscle tone, no cerebellar symptoms. Sensation intact to monofilament.  Psych: Awake and oriented X 3, normal affect, Insight and Judgment appropriate.   EKG: NSR  Gorden Harms Porshea Janowski 3:10 PM [facility]

## 2020-07-28 LAB — CBC WITH DIFFERENTIAL/PLATELET
Absolute Monocytes: 656 cells/uL (ref 200–950)
Basophils Absolute: 48 cells/uL (ref 0–200)
Basophils Relative: 0.6 %
Eosinophils Absolute: 240 cells/uL (ref 15–500)
Eosinophils Relative: 3 %
HCT: 42.7 % (ref 38.5–50.0)
Hemoglobin: 14.5 g/dL (ref 13.2–17.1)
Lymphs Abs: 2352 cells/uL (ref 850–3900)
MCH: 29.4 pg (ref 27.0–33.0)
MCHC: 34 g/dL (ref 32.0–36.0)
MCV: 86.4 fL (ref 80.0–100.0)
MPV: 9.4 fL (ref 7.5–12.5)
Monocytes Relative: 8.2 %
Neutro Abs: 4704 cells/uL (ref 1500–7800)
Neutrophils Relative %: 58.8 %
Platelets: 300 10*3/uL (ref 140–400)
RBC: 4.94 10*6/uL (ref 4.20–5.80)
RDW: 12.9 % (ref 11.0–15.0)
Total Lymphocyte: 29.4 %
WBC: 8 10*3/uL (ref 3.8–10.8)

## 2020-07-28 LAB — COMPLETE METABOLIC PANEL WITH GFR
AG Ratio: 2.1 (calc) (ref 1.0–2.5)
ALT: 22 U/L (ref 9–46)
AST: 12 U/L (ref 10–40)
Albumin: 4.8 g/dL (ref 3.6–5.1)
Alkaline phosphatase (APISO): 57 U/L (ref 36–130)
BUN: 23 mg/dL (ref 7–25)
CO2: 27 mmol/L (ref 20–32)
Calcium: 10.4 mg/dL — ABNORMAL HIGH (ref 8.6–10.3)
Chloride: 101 mmol/L (ref 98–110)
Creat: 1.27 mg/dL (ref 0.60–1.35)
GFR, Est African American: 84 mL/min/{1.73_m2} (ref >=60)
GFR, Est Non African American: 73 mL/min/{1.73_m2} (ref >=60)
Globulin: 2.3 g/dL (calc) (ref 1.9–3.7)
Glucose, Bld: 162 mg/dL — ABNORMAL HIGH (ref 65–99)
Potassium: 4.5 mmol/L (ref 3.5–5.3)
Sodium: 138 mmol/L (ref 135–146)
Total Bilirubin: 0.7 mg/dL (ref 0.2–1.2)
Total Protein: 7.1 g/dL (ref 6.1–8.1)

## 2020-07-28 LAB — HEMOGLOBIN A1C
Hgb A1c MFr Bld: 8 % of total Hgb — ABNORMAL HIGH (ref <5.7)
Mean Plasma Glucose: 183 (calc)
eAG (mmol/L): 10.1 (calc)

## 2020-07-28 LAB — SARS-COV-2 ANTIBODY(IGG)SPIKE,SEMI-QUANTITATIVE: SARS COV1 AB(IGG)SPIKE,SEMI QN: <1 index (ref <1.00)

## 2020-07-28 LAB — URINALYSIS, ROUTINE W REFLEX MICROSCOPIC
Bilirubin Urine: NEGATIVE
Hgb urine dipstick: NEGATIVE
Ketones, ur: NEGATIVE
Leukocytes,Ua: NEGATIVE
Nitrite: NEGATIVE
Protein, ur: NEGATIVE
Specific Gravity, Urine: 1.02 (ref 1.001–1.03)
pH: 7 (ref 5.0–8.0)

## 2020-07-28 LAB — MAGNESIUM: Magnesium: 2.1 mg/dL (ref 1.5–2.5)

## 2020-07-28 LAB — LIPID PANEL
Cholesterol: 155 mg/dL (ref <200)
HDL: 33 mg/dL — ABNORMAL LOW (ref >=40)
LDL Cholesterol (Calc): 85 mg/dL (calc)
Non-HDL Cholesterol (Calc): 122 mg/dL (calc) (ref <130)
Total CHOL/HDL Ratio: 4.7 (calc) (ref <5.0)
Triglycerides: 284 mg/dL — ABNORMAL HIGH (ref <150)

## 2020-07-28 LAB — MICROALBUMIN / CREATININE URINE RATIO
Creatinine, Urine: 34 mg/dL (ref 20–320)
Microalb, Ur: <0.2 mg/dL

## 2020-07-28 LAB — TSH: TSH: 1.73 mIU/L (ref 0.40–4.50)

## 2020-07-28 LAB — VITAMIN D 25 HYDROXY (VIT D DEFICIENCY, FRACTURES): Vit D, 25-Hydroxy: 52 ng/mL (ref 30–100)

## 2020-08-12 ENCOUNTER — Other Ambulatory Visit: Payer: Self-pay | Admitting: Internal Medicine

## 2020-08-12 DIAGNOSIS — E1159 Type 2 diabetes mellitus with other circulatory complications: Secondary | ICD-10-CM

## 2020-08-12 DIAGNOSIS — E1169 Type 2 diabetes mellitus with other specified complication: Secondary | ICD-10-CM

## 2020-08-12 DIAGNOSIS — E785 Hyperlipidemia, unspecified: Secondary | ICD-10-CM

## 2020-08-12 DIAGNOSIS — I10 Essential (primary) hypertension: Secondary | ICD-10-CM

## 2020-08-12 MED ORDER — ROSUVASTATIN CALCIUM 40 MG PO TABS: ORAL_TABLET | ORAL | 0 refills | Status: DC

## 2020-08-12 MED ORDER — METFORMIN HCL 500 MG PO TABS: ORAL_TABLET | ORAL | 0 refills | Status: DC

## 2020-08-15 ENCOUNTER — Ambulatory Visit (INDEPENDENT_AMBULATORY_CARE_PROVIDER_SITE_OTHER): Payer: BC Managed Care – PPO

## 2020-08-15 ENCOUNTER — Other Ambulatory Visit: Payer: Self-pay

## 2020-08-15 ENCOUNTER — Ambulatory Visit: Payer: BC Managed Care – PPO

## 2020-08-15 VITALS — BP 150/84 | HR 78 | Temp 97.5°F | Wt 325.0 lb

## 2020-08-15 DIAGNOSIS — I10 Essential (primary) hypertension: Secondary | ICD-10-CM | POA: Diagnosis not present

## 2020-08-15 MED ORDER — LOSARTAN POTASSIUM-HCTZ 50-12.5 MG PO TABS: ORAL_TABLET | ORAL | 2 refills | Status: DC

## 2020-08-15 NOTE — Addendum Note (Signed)
Addended by: Dionicio Stall on: 08/15/2020 11:25 AM   Modules accepted: Orders

## 2020-08-15 NOTE — Progress Notes (Signed)
Patient presents to the office for a nurse visit to have a blood pressure checked, per provider. Patient been taken off of Lisinopril and had started Losartan, 50mg . BP reading today at the office was 150/84. Provider aware and instructed to send in a new prescription for Losartan-Hydrochlorothiazide 50-12.5mg  and to discontinue the Losartan. Will follow up in 2-4 weeks to have another nurse visit. Patient instructed to take BP daily and keep a log of readings.

## 2020-09-05 ENCOUNTER — Ambulatory Visit: Payer: BLUE CROSS/BLUE SHIELD | Admitting: Neurology

## 2020-09-12 ENCOUNTER — Other Ambulatory Visit: Payer: Self-pay

## 2020-09-12 ENCOUNTER — Ambulatory Visit (INDEPENDENT_AMBULATORY_CARE_PROVIDER_SITE_OTHER): Payer: BC Managed Care – PPO

## 2020-09-12 DIAGNOSIS — Z79899 Other long term (current) drug therapy: Secondary | ICD-10-CM | POA: Diagnosis not present

## 2020-09-12 DIAGNOSIS — I10 Essential (primary) hypertension: Secondary | ICD-10-CM

## 2020-09-12 NOTE — Progress Notes (Signed)
Patient presents to the office for a nurse visit to have BP checked and have labs done to check kidney function. BP reading today was 138/90, states that he had taken an extra tablet of the Losartan-HCTZ this morning prior to his office visit. No questions or concerns.

## 2020-09-13 LAB — BASIC METABOLIC PANEL WITH GFR
BUN: 17 mg/dL (ref 7–25)
CO2: 29 mmol/L (ref 20–32)
Calcium: 10.8 mg/dL — ABNORMAL HIGH (ref 8.6–10.3)
Chloride: 97 mmol/L — ABNORMAL LOW (ref 98–110)
Creat: 0.95 mg/dL (ref 0.60–1.35)
GFR, Est African American: 120 mL/min/{1.73_m2} (ref >=60)
GFR, Est Non African American: 103 mL/min/{1.73_m2} (ref >=60)
Glucose, Bld: 144 mg/dL — ABNORMAL HIGH (ref 65–99)
Potassium: 4.4 mmol/L (ref 3.5–5.3)
Sodium: 138 mmol/L (ref 135–146)

## 2020-09-21 ENCOUNTER — Other Ambulatory Visit: Payer: Self-pay | Admitting: Adult Health

## 2020-09-21 MED ORDER — BISOPROLOL FUMARATE 5 MG PO TABS: 5.0000 mg | ORAL_TABLET | Freq: Every day | ORAL | 0 refills | Status: DC

## 2020-10-05 ENCOUNTER — Other Ambulatory Visit: Payer: Self-pay | Admitting: Adult Health

## 2020-10-05 MED ORDER — LISINOPRIL 40 MG PO TABS: ORAL_TABLET | ORAL | 0 refills | Status: DC

## 2020-10-05 MED ORDER — LISINOPRIL 20 MG PO TABS: ORAL_TABLET | ORAL | 0 refills | Status: DC

## 2020-10-12 ENCOUNTER — Other Ambulatory Visit: Payer: Self-pay | Admitting: Internal Medicine

## 2020-10-12 ENCOUNTER — Other Ambulatory Visit: Payer: Self-pay | Admitting: Adult Health

## 2020-10-12 DIAGNOSIS — E1159 Type 2 diabetes mellitus with other circulatory complications: Secondary | ICD-10-CM

## 2020-10-16 ENCOUNTER — Encounter: Payer: Self-pay | Admitting: Adult Health

## 2020-10-16 ENCOUNTER — Ambulatory Visit: Payer: BC Managed Care – PPO | Admitting: Adult Health

## 2020-10-16 ENCOUNTER — Other Ambulatory Visit: Payer: Self-pay

## 2020-10-16 VITALS — BP 130/80 | HR 95 | Temp 97.1°F | Wt 327.0 lb

## 2020-10-16 DIAGNOSIS — R0989 Other specified symptoms and signs involving the circulatory and respiratory systems: Secondary | ICD-10-CM | POA: Diagnosis not present

## 2020-10-16 DIAGNOSIS — K219 Gastro-esophageal reflux disease without esophagitis: Secondary | ICD-10-CM

## 2020-10-16 DIAGNOSIS — I6521 Occlusion and stenosis of right carotid artery: Secondary | ICD-10-CM

## 2020-10-16 DIAGNOSIS — R5383 Other fatigue: Secondary | ICD-10-CM | POA: Diagnosis not present

## 2020-10-16 DIAGNOSIS — R079 Chest pain, unspecified: Secondary | ICD-10-CM

## 2020-10-16 DIAGNOSIS — R42 Dizziness and giddiness: Secondary | ICD-10-CM

## 2020-10-16 DIAGNOSIS — R1013 Epigastric pain: Secondary | ICD-10-CM | POA: Diagnosis not present

## 2020-10-16 LAB — COMPLETE METABOLIC PANEL WITH GFR
AG Ratio: 2 (calc) (ref 1.0–2.5)
ALT: 35 U/L (ref 9–46)
AST: 17 U/L (ref 10–40)
Albumin: 5 g/dL (ref 3.6–5.1)
Alkaline phosphatase (APISO): 43 U/L (ref 36–130)
BUN: 14 mg/dL (ref 7–25)
CO2: 28 mmol/L (ref 20–32)
Calcium: 10.7 mg/dL — ABNORMAL HIGH (ref 8.6–10.3)
Chloride: 101 mmol/L (ref 98–110)
Creat: 1.06 mg/dL (ref 0.60–1.35)
GFR, Est African American: 105 mL/min/{1.73_m2} (ref >=60)
GFR, Est Non African American: 90 mL/min/{1.73_m2} (ref >=60)
Globulin: 2.5 g/dL (calc) (ref 1.9–3.7)
Glucose, Bld: 152 mg/dL — ABNORMAL HIGH (ref 65–99)
Potassium: 4.6 mmol/L (ref 3.5–5.3)
Sodium: 138 mmol/L (ref 135–146)
Total Bilirubin: 1.3 mg/dL — ABNORMAL HIGH (ref 0.2–1.2)
Total Protein: 7.5 g/dL (ref 6.1–8.1)

## 2020-10-16 LAB — CBC WITH DIFFERENTIAL/PLATELET
Absolute Monocytes: 638 cells/uL (ref 200–950)
Basophils Absolute: 42 cells/uL (ref 0–200)
Basophils Relative: 0.5 %
Eosinophils Absolute: 176 cells/uL (ref 15–500)
Eosinophils Relative: 2.1 %
HCT: 45.7 % (ref 38.5–50.0)
Hemoglobin: 15.2 g/dL (ref 13.2–17.1)
Lymphs Abs: 1940 cells/uL (ref 850–3900)
MCH: 28.7 pg (ref 27.0–33.0)
MCHC: 33.3 g/dL (ref 32.0–36.0)
MCV: 86.2 fL (ref 80.0–100.0)
MPV: 9.5 fL (ref 7.5–12.5)
Monocytes Relative: 7.6 %
Neutro Abs: 5603 cells/uL (ref 1500–7800)
Neutrophils Relative %: 66.7 %
Platelets: 281 10*3/uL (ref 140–400)
RBC: 5.3 10*6/uL (ref 4.20–5.80)
RDW: 12.8 % (ref 11.0–15.0)
Total Lymphocyte: 23.1 %
WBC: 8.4 10*3/uL (ref 3.8–10.8)

## 2020-10-16 LAB — TSH: TSH: 1.45 mIU/L (ref 0.40–4.50)

## 2020-10-16 LAB — MAGNESIUM: Magnesium: 2.1 mg/dL (ref 1.5–2.5)

## 2020-10-16 LAB — SEDIMENTATION RATE: Sed Rate: 2 mm/h (ref 0–15)

## 2020-10-16 LAB — VITAMIN B12: Vitamin B-12: 386 pg/mL (ref 200–1100)

## 2020-10-16 MED ORDER — OMEPRAZOLE 40 MG PO CPDR: 40.0000 mg | DELAYED_RELEASE_CAPSULE | Freq: Every day | ORAL | 1 refills | Status: DC

## 2020-10-16 NOTE — Patient Instructions (Signed)
Restart omeprazole 30-60 min prior to bedtime   Will order ultrasound to follow up on carotid arteries -   Please get back to me ASAP on jardiance once you call insurance -     Gastroesophageal Reflux Disease, Adult Gastroesophageal reflux (GER) happens when acid from the stomach flows up into the tube that connects the mouth and the stomach (esophagus). Normally, food travels down the esophagus and stays in the stomach to be digested. However, when a person has GER, food and stomach acid sometimes move back up into the esophagus. If this becomes a more serious problem, the person may be diagnosed with a disease called gastroesophageal reflux disease (GERD). GERD occurs when the reflux:  Happens often.  Causes frequent or severe symptoms.  Causes problems such as damage to the esophagus. When stomach acid comes in contact with the esophagus, the acid may cause inflammation in the esophagus. Over time, GERD may create small holes (ulcers) in the lining of the esophagus. What are the causes? This condition is caused by a problem with the muscle between the esophagus and the stomach (lower esophageal sphincter, or LES). Normally, the LES muscle closes after food passes through the esophagus to the stomach. When the LES is weakened or abnormal, it does not close properly, and that allows food and stomach acid to go back up into the esophagus. The LES can be weakened by certain dietary substances, medicines, and medical conditions, including:  Tobacco use.  Pregnancy.  Having a hiatal hernia.  Alcohol use.  Certain foods and beverages, such as coffee, chocolate, onions, and peppermint. What increases the risk? You are more likely to develop this condition if you:  Have an increased body weight.  Have a connective tissue disorder.  Take NSAIDs, such as ibuprofen. What are the signs or symptoms? Symptoms of this condition include:  Heartburn.  Difficult or painful swallowing and  the feeling of having a lump in the throat.  A bitter taste in the mouth.  Bad breath and having a large amount of saliva.  Having an upset or bloated stomach and belching.  Chest pain. Different conditions can cause chest pain. Make sure you see your health care provider if you experience chest pain.  Shortness of breath or wheezing.  Ongoing (chronic) cough or a nighttime cough.  Wearing away of tooth enamel.  Weight loss. How is this diagnosed? This condition may be diagnosed based on a medical history and a physical exam. To determine if you have mild or severe GERD, your health care provider may also monitor how you respond to treatment. You may also have tests, including:  A test to examine your stomach and esophagus with a small camera (endoscopy).  A test that measures the acidity level in your esophagus.  A test that measures how much pressure is on your esophagus.  A barium swallow or modified barium swallow test to show the shape, size, and functioning of your esophagus. How is this treated? Treatment for this condition may vary depending on how severe your symptoms are. Your health care provider may recommend:  Changes to your diet.  Medicine.  Surgery. The goal of treatment is to help relieve your symptoms and to prevent complications. Follow these instructions at home: Eating and drinking  Follow a diet as recommended by your health care provider. This may involve avoiding foods and drinks such as: ? Coffee and tea, with or without caffeine. ? Drinks that contain alcohol. ? Energy drinks and sports drinks. ?  Carbonated drinks or sodas. ? Chocolate and cocoa. ? Peppermint and mint flavorings. ? Garlic and onions. ? Horseradish. ? Spicy and acidic foods, including peppers, chili powder, curry powder, vinegar, hot sauces, and barbecue sauce. ? Citrus fruit juices and citrus fruits, such as oranges, lemons, and limes. ? Tomato-based foods, such as red  sauce, chili, salsa, and pizza with red sauce. ? Fried and fatty foods, such as donuts, french fries, potato chips, and high-fat dressings. ? High-fat meats, such as hot dogs and fatty cuts of red and white meats, such as rib eye steak, sausage, ham, and bacon. ? High-fat dairy items, such as whole milk, butter, and cream cheese.  Eat small, frequent meals instead of large meals.  Avoid drinking large amounts of liquid with your meals.  Avoid eating meals during the 2-3 hours before bedtime.  Avoid lying down right after you eat.  Do not exercise right after you eat.   Lifestyle  Do not use any products that contain nicotine or tobacco. These products include cigarettes, chewing tobacco, and vaping devices, such as e-cigarettes. If you need help quitting, ask your health care provider.  Try to reduce your stress by using methods such as yoga or meditation. If you need help reducing stress, ask your health care provider.  If you are overweight, reduce your weight to an amount that is healthy for you. Ask your health care provider for guidance about a safe weight loss goal.   General instructions  Pay attention to any changes in your symptoms.  Take over-the-counter and prescription medicines only as told by your health care provider. Do not take aspirin, ibuprofen, or other NSAIDs unless your health care provider told you to take these medicines.  Wear loose-fitting clothing. Do not wear anything tight around your waist that causes pressure on your abdomen.  Raise (elevate) the head of your bed about 6 inches (15 cm). You can use a wedge to do this.  Avoid bending over if this makes your symptoms worse.  Keep all follow-up visits. This is important. Contact a health care provider if:  You have: ? New symptoms. ? Unexplained weight loss. ? Difficulty swallowing or it hurts to swallow. ? Wheezing or a persistent cough. ? A hoarse voice.  Your symptoms do not improve with  treatment. Get help right away if:  You have sudden pain in your arms, neck, jaw, teeth, or back.  You suddenly feel sweaty, dizzy, or light-headed.  You have chest pain or shortness of breath.  You vomit and the vomit is green, yellow, or black, or it looks like blood or coffee grounds.  You faint.  You have stool that is red, bloody, or black.  You cannot swallow, drink, or eat. These symptoms may represent a serious problem that is an emergency. Do not wait to see if the symptoms will go away. Get medical help right away. Call your local emergency services (911 in the [place] Do not drive yourself to the hospital. Summary  Gastroesophageal reflux happens when acid from the stomach flows up into the esophagus. GERD is a disease in which the reflux happens often, causes frequent or severe symptoms, or causes problems such as damage to the esophagus.  Treatment for this condition may vary depending on how severe your symptoms are. Your health care provider may recommend diet and lifestyle changes, medicine, or surgery.  Contact a health care provider if you have new or worsening symptoms.  Take over-the-counter and prescription medicines only as  told by your health care provider. Do not take aspirin, ibuprofen, or other NSAIDs unless your health care provider told you to do so.  Keep all follow-up visits as told by your health care provider. This is important. This information is not intended to replace advice given to you by your health care provider. Make sure you discuss any questions you have with your health care provider. Document Revised: 04/03/2020 Document Reviewed: 04/03/2020 Elsevier Patient Education  2021 [employer].
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
## 2020-10-17 ENCOUNTER — Other Ambulatory Visit: Payer: Self-pay | Admitting: Adult Health

## 2020-10-17 DIAGNOSIS — R7989 Other specified abnormal findings of blood chemistry: Secondary | ICD-10-CM

## 2020-10-17 DIAGNOSIS — R17 Unspecified jaundice: Secondary | ICD-10-CM

## 2020-10-17 LAB — C-REACTIVE PROTEIN: CRP: 2.2 mg/L (ref <8.0)

## 2020-10-18 ENCOUNTER — Encounter: Payer: Self-pay | Admitting: Adult Health

## 2020-10-19 ENCOUNTER — Encounter (HOSPITAL_COMMUNITY): Payer: Self-pay

## 2020-10-27 ENCOUNTER — Encounter (HOSPITAL_COMMUNITY): Payer: Self-pay

## 2020-11-03 ENCOUNTER — Ambulatory Visit
Admission: RE | Admit: 2020-11-03 | Discharge: 2020-11-03 | Disposition: A | Discharge status: Home / Self Care | Payer: No Typology Code available for payment source | Source: Ambulatory Visit | Attending: Adult Health | Admitting: Adult Health

## 2020-11-03 DIAGNOSIS — R17 Unspecified jaundice: Secondary | ICD-10-CM

## 2020-11-03 DIAGNOSIS — R7989 Other specified abnormal findings of blood chemistry: Secondary | ICD-10-CM

## 2020-11-06 ENCOUNTER — Ambulatory Visit: Payer: BC Managed Care – PPO | Admitting: Adult Health

## 2020-11-06 ENCOUNTER — Other Ambulatory Visit: Payer: Self-pay | Admitting: Adult Health

## 2020-11-06 DIAGNOSIS — K769 Liver disease, unspecified: Secondary | ICD-10-CM | POA: Insufficient documentation

## 2020-11-06 DIAGNOSIS — K760 Fatty (change of) liver, not elsewhere classified: Secondary | ICD-10-CM | POA: Insufficient documentation

## 2020-11-20 ENCOUNTER — Other Ambulatory Visit: Payer: Self-pay | Admitting: Adult Health

## 2020-11-20 ENCOUNTER — Other Ambulatory Visit: Payer: Self-pay | Admitting: Internal Medicine

## 2020-11-20 DIAGNOSIS — E1159 Type 2 diabetes mellitus with other circulatory complications: Secondary | ICD-10-CM

## 2020-11-20 DIAGNOSIS — E1169 Type 2 diabetes mellitus with other specified complication: Secondary | ICD-10-CM

## 2020-11-20 DIAGNOSIS — E785 Hyperlipidemia, unspecified: Secondary | ICD-10-CM

## 2020-11-29 ENCOUNTER — Ambulatory Visit: Payer: No Typology Code available for payment source | Admitting: Adult Health

## 2020-12-01 ENCOUNTER — Ambulatory Visit: Payer: No Typology Code available for payment source | Admitting: Adult Health

## 2020-12-01 ENCOUNTER — Encounter: Payer: Self-pay | Admitting: Adult Health

## 2020-12-01 ENCOUNTER — Other Ambulatory Visit: Payer: Self-pay

## 2020-12-01 VITALS — BP 122/84 | HR 88 | Temp 97.9°F | Wt 329.0 lb

## 2020-12-01 DIAGNOSIS — R5383 Other fatigue: Secondary | ICD-10-CM

## 2020-12-01 DIAGNOSIS — Z79899 Other long term (current) drug therapy: Secondary | ICD-10-CM

## 2020-12-01 DIAGNOSIS — Z8673 Personal history of transient ischemic attack (TIA), and cerebral infarction without residual deficits: Secondary | ICD-10-CM

## 2020-12-01 DIAGNOSIS — E1159 Type 2 diabetes mellitus with other circulatory complications: Secondary | ICD-10-CM | POA: Diagnosis not present

## 2020-12-01 DIAGNOSIS — I10 Essential (primary) hypertension: Secondary | ICD-10-CM | POA: Diagnosis not present

## 2020-12-01 DIAGNOSIS — E785 Hyperlipidemia, unspecified: Secondary | ICD-10-CM

## 2020-12-01 DIAGNOSIS — E1169 Type 2 diabetes mellitus with other specified complication: Secondary | ICD-10-CM | POA: Diagnosis not present

## 2020-12-01 DIAGNOSIS — K219 Gastro-esophageal reflux disease without esophagitis: Secondary | ICD-10-CM

## 2020-12-01 DIAGNOSIS — R7989 Other specified abnormal findings of blood chemistry: Secondary | ICD-10-CM

## 2020-12-01 MED ORDER — KETOCONAZOLE 2 % EX CREA: 1.0000 "application " | TOPICAL_CREAM | Freq: Two times a day (BID) | CUTANEOUS | 0 refills | Status: DC

## 2020-12-01 NOTE — Addendum Note (Signed)
Addended by: Dan Maker on: 12/01/2020 01:01 PM   Modules accepted: Orders

## 2020-12-01 NOTE — Progress Notes (Signed)
FOLLOW UP  Assessment and Plan:   Hypertension, unspecified type Continue medication Monitor blood pressure at home; call if consistently over 130/80 Continue DASH diet.   Reminder to go to the ER if any CP, SOB, nausea, dizziness, severe HA, changes vision/speech, left arm numbness and tingling and jaw pain. -     COMPLETE METABOLIC PANEL WITH GFR -     Magnesium  Type 2 diabetes mellitus with other specified complication, without long-term current use of insulin [facility]) Education: Reviewed 'ABCs' of diabetes management (respective goals in parentheses):  A1C (<7), blood pressure (<130/80), and cholesterol (LDL <70) Eye Exam yearly and Dental Exam every 6 months. Dietary recommendations Physical Activity recommendations Continue metformin and recently back on jardiance Didn't tolerate ozempic, Bcise strongly motivated to work on lifestyle and we are willing to work with this as long as we continue to see progress with lifestyle modification and weight loss going forward NEEDS TO CHECK FASTING - minimum 2-3 days/week, keep a log -     Hemoglobin A1c   History of CVA (cerebrovascular accident) Avoid vasocontrictive meds; continue ASA;  Control blood pressure, cholesterol, glucose, increase exercise.  -     Lipid panel  Morbid obesity (HCC) Long discussion about weight loss, diet, and exercise Discussed appropriate weight for height and initial goal (<300lb) - he is confident he can achieve this in the next 3-6 months Intolerant of phentermine, topamax, GLP-1i;  Work on improving stress/sleep patterns Discussed macros, portions, mindful eating; he is making small sustainable changes Follow up at next visit  Hyperlipidemia associated with type 2 diabetes mellitus (HCC) Continue medications: rosuvastatin 40 mg daily LDL goal <70, working on lifestyle Continue low cholesterol diet and exercise.  Check lipid panel.  -     Lipid panel -     TSH  Vitamin D deficiency Continue  vitamin D supplement for goal 60-100 Check vitamin D level at CPE  Fatigue B12 was borderline, get on SL supplement Per his preference check testosterone; continue to recommend weight loss, consider zinc supplement 40-50 mg daily if low   Continue diet and meds as discussed. Further disposition pending results of labs. Discussed med's effects and SE's.   Over 30 minutes of exam, counseling, chart review, and critical decision making was performed.   Future Appointments  Date Time Provider Department Center  02/12/2021  2:30 PM Judd Gaudier, NP [facility] None  07/30/2021  3:00 PM Judd Gaudier, NP [facility] None    ----------------------------------------------------------------------------------------------------------------------  HPI 35 y.o. male  presents for 3 month follow up on hypertension, cholesterol, diabetes, morbid obesity and vitamin D deficiency.   He has had 2 episodes of TIA, suspected r/t vasoconstrictive meds after otherwise unremarkable workup by neurology Dr. Pearlean Brownie, concluded cerebral reversible vasoconstriction syndrome and advised to avoid sudafed, phentermine.   GERD has been fairly controlled by omeprazole 40 mg daily.   BMI is Body mass index is 44.01 kg/m., he reports back on diet, trying to get back into a diet, trying to keep it simple, doing veggies, sugar free fruit.  Trying to reduce eating out, eating less burgers, fast food.   Intolerant topamax in the past (memory), phentermine contraindicated Down 10 lb since last visit   Goal is <300 lb by next visit per patient Water intake: over a gallon daily, or unsweet tea, black coffee, <1 soda/week Alcohol: none in the last 3 months Gets on scale weekly, down 4 lb since new diet Trying to increase walking, 1 mile a few days  a week  Wt Readings from Last 3 Encounters:  12/01/20 (!) 329 lb (149.2 kg)  10/16/20 (!) 327 lb (148.3 kg)  09/12/20 (!) 328 lb 9.6 oz (149.1 kg)   His blood pressure has  been controlled at home (125-130/80s), today their BP is BP: 122/84  He does workout. He denies chest pain, shortness of breath, dizziness.   He is on cholesterol medication Rosuvastatin 40 mg daily and denies myalgias. His cholesterol is not at goal of LDL <70. The cholesterol last visit was:   Lab Results  Component Value Date   CHOL 155 07/27/2020   HDL 33 (L) 07/27/2020   LDLCALC 85 07/27/2020   TRIG 284 (H) 07/27/2020   CHOLHDL 4.7 07/27/2020    He has been working on diet and exercise for T2DM newly on metformin 2000 mg daily, (SE with bydureon, ozempic), on jardiance 25 mg working aggressively on lifestyle per patient preference, and denies increased appetite, nausea, paresthesia of the feet, polydipsia, polyuria, visual disturbances and vomiting.  He reports when fasting was running high, 140-160, hasn't checked since new  Last in the office was:  Lab Results  Component Value Date   HGBA1C 8.0 (H) 07/27/2020    Last GFR:  Lab Results  Component Value Date   GFRNONAA 90 10/16/2020   Patient is on Vitamin D supplement, taking 38182 IU daily. Lab Results  Component Value Date   VD25OH 52 07/27/2020       Lab Results  Component Value Date   VITAMINB12 386 10/16/2020     Current Medications:  Current Outpatient Medications on File Prior to Visit  Medication Sig  . aspirin 81 MG chewable tablet Chew 81 mg by mouth daily.   . Cholecalciferol (VITAMIN D3) 250 MCG (10000 UT) capsule Take 10,000 Units by mouth daily.  . empagliflozin (JARDIANCE) 25 MG TABS tablet Take 1 tablet Daily for Diabetes  . fluticasone (FLONASE) 50 MCG/ACT nasal spray INSTILL TWO SPRAYS IN EACH NOSTRIL TWICE A DAY  . glucose blood (FREESTYLE LITE) test strip Test sugar once daily  . lisinopril (ZESTRIL) 40 MG tablet Take    1 tablet     Daily     for BP  . metFORMIN (GLUCOPHAGE) 500 MG tablet TAKE TWO TABLETS BY MOUTH TWICE A DAY WITH MEALS FOR DIABETES  . omeprazole (PRILOSEC) 40 MG capsule Take  1 capsule (40 mg total) by mouth daily. 30 min prior to bedtime.  . rosuvastatin (CRESTOR) 40 MG tablet TAKE ONE TABLET BY MOUTH DAILY FOR CHOLESTEROL  . famotidine (PEPCID) 20 MG tablet Take 1 tablet 2 x  /day for Indigestion & Heartburn (Patient not taking: Reported on 12/01/2020)  . loratadine (CLARITIN) 10 MG tablet Take 10 mg by mouth daily as needed.  (Patient not taking: No sig reported)   No current facility-administered medications on file prior to visit.     Allergies:  Allergies  Allergen Reactions  . Bisoprolol     Numbness, tingling, feels bad  . Codeine Swelling     Medical History:  Past Medical History:  Diagnosis Date  . Diabetes mellitus without complication (HCC)   . Hypertension   . Morbid obesity (HCC)   . Stroke [facility])    "mini stroke"   Family history- Reviewed and unchanged Social history- Reviewed and unchanged   Review of Systems:  Review of Systems  Constitutional: Negative for malaise/fatigue and weight loss.  HENT: Negative for hearing loss and tinnitus.   Eyes: Negative for blurred vision  and double vision.  Respiratory: Negative for cough, shortness of breath and wheezing.   Cardiovascular: Negative for chest pain, palpitations, orthopnea, claudication and leg swelling.  Gastrointestinal: Negative for abdominal pain, blood in stool, constipation, diarrhea, heartburn, melena, nausea and vomiting.  Genitourinary: Negative.   Musculoskeletal: Negative for joint pain and myalgias.  Skin: Negative for rash.  Neurological: Negative for dizziness, tingling, sensory change, weakness and headaches.  Endo/Heme/Allergies: Negative for polydipsia.  Psychiatric/Behavioral: Negative.   All other systems reviewed and are negative.   Physical Exam: BP 122/84   Pulse 88   Temp 97.9 F (36.6 C)   Wt (!) 329 lb (149.2 kg)   SpO2 96%   BMI 44.01 kg/m  Wt Readings from Last 3 Encounters:  12/01/20 (!) 329 lb (149.2 kg)  10/16/20 (!) 327 lb (148.3 kg)   09/12/20 (!) 328 lb 9.6 oz (149.1 kg)   General Appearance: Well nourished, well dressed, morbidly obese young male in no apparent distress. Eyes: PERRLA, EOMs, conjunctiva no swelling or erythema Sinuses: No Frontal/maxillary tenderness ENT/Mouth: Ext aud canals clear, TMs without erythema, bulging. Mask in place; oral exam deferred. Hearing normal.  Neck: Supple, thyroid normal.  Respiratory: Respiratory effort normal, BS equal bilaterally without rales, rhonchi, wheezing or stridor.  Cardio: RRR with no MRGs. Brisk peripheral pulses without edema.  Abdomen: Soft, obese abdomen, + BS.  Non tender, no guarding, rebound, hernias, masses. Lymphatics: Non tender without lymphadenopathy.  Musculoskeletal: Full ROM, 5/5 strength, Normal gait Skin: Warm, dry without rashes, lesions, ecchymosis.  Neuro: Cranial nerves intact. No cerebellar symptoms.  Psych: Awake and oriented X 3, normal affect, Insight and Judgment appropriate.    Dan Maker, NP 12:07 PM [facility]

## 2020-12-01 NOTE — Patient Instructions (Signed)
Goals    . Blood Pressure < 130/80    . DIET - INCREASE WATER INTAKE     80-100 fluid ounces    . Exercise 150 min/wk Moderate Activity    . HEMOGLOBIN A1C < 7.0    . LDL CALC < 70    . Weight (lb) < 275 lb (124.7 kg)         For testosterone -   Can do zinc 40-50 mg a day Can try shake that is whey protein, almond milk, avocado oil, and spinach and strawberries in the morning  9 Ways to Naturally Increase Testosterone Levels  1.   Lose Weight If you're overweight, shedding the excess pounds may increase your testosterone levels, according to research presented at the Endocrine Society's 2012 meeting. Overweight men are more likely to have low testosterone levels to begin with, so this is an important trick to increase your body's testosterone production when you need it most.  2.   High-Intensity Exercise like Peak Fitness  Short intense exercise has a proven positive effect on increasing testosterone levels and preventing its decline. That's unlike aerobics or prolonged moderate exercise, which have shown to have negative or no effect on testosterone levels. Having a whey protein meal after exercise can further enhance the satiety/testosterone-boosting impact (hunger hormones cause the opposite effect on your testosterone and libido). Here's a summary of what a typical high-intensity Peak Fitness routine might look like: " Warm up for three minutes  " Exercise as hard and fast as you can for 30 seconds. You should feel like you couldn't possibly go on another few seconds  " Recover at a slow to moderate pace for 90 seconds  " Repeat the high intensity exercise and recovery 7 more times .  3.   Consume Plenty of Zinc The mineral zinc is important for testosterone production, and supplementing your diet for as little as six weeks has been shown to cause a marked improvement in testosterone among men with low levels.1 Likewise, research has shown that restricting dietary sources of  zinc leads to a significant decrease in testosterone, while zinc supplementation increases it2 -- and even protects men from exercised-induced reductions in testosterone levels.3 It's estimated that up to 45 percent of adults over the age of 60 may have lower than recommended zinc intakes; even when dietary supplements were added in, an estimated 20-25 percent of older adults still had inadequate zinc intakes, according to a [employer] and Nutrition Examination Survey.4 Your diet is the best source of zinc; along with protein-rich foods like meats and fish, other good dietary sources of zinc include raw milk, raw cheese, beans, and yogurt or kefir made from raw milk. It can be difficult to obtain enough dietary zinc if you're a vegetarian, and also for meat-eaters as well, largely because of conventional farming methods that rely heavily on chemical fertilizers and pesticides. These chemicals deplete the soil of nutrients ... nutrients like zinc that must be absorbed by plants in order to be passed on to you. In many cases, you may further deplete the nutrients in your food by the way you prepare it. For most food, cooking it will drastically reduce its levels of nutrients like zinc ... particularly over-cooking, which many people do. If you decide to use a zinc supplement, stick to a dosage of less than 40 mg a day, as this is the recommended adult upper limit. Taking too much zinc can interfere with your body's ability to absorb  other minerals, especially copper, and may cause nausea as a side effect.  4.   Strength Training In addition to Peak Fitness, strength training is also known to boost testosterone levels, provided you are doing so intensely enough. When strength training to boost testosterone, you'll want to increase the weight and lower your number of reps, and then focus on exercises that work a large number of muscles, such as dead lifts or squats.  You can "turbo-charge" your weight  training by going slower. By slowing down your movement, you're actually turning it into a high-intensity exercise. Super Slow movement allows your muscle, at the microscopic level, to access the maximum number of cross-bridges between the protein filaments that produce movement in the muscle.   5.   Optimize Your Vitamin D Levels Vitamin D, a steroid hormone, is essential for the healthy development of the nucleus of the sperm cell, and helps maintain semen quality and sperm count. Vitamin D also increases levels of testosterone, which may boost libido. In one study, overweight men who were given vitamin D supplements had a significant increase in testosterone levels after one year.5   6.   Reduce Stress When you're under a lot of stress, your body releases high levels of the stress hormone cortisol. This hormone actually blocks the effects of testosterone,6 presumably because, from a biological standpoint, testosterone-associated behaviors (mating, competing, aggression) may have lowered your chances of survival in an emergency (hence, the "fight or flight" response is dominant, courtesy of cortisol).  7.   Limit or Eliminate Sugar from Your Diet Testosterone levels decrease after you eat sugar, which is likely because the sugar leads to a high insulin level, another factor leading to low testosterone.7 Based on USDA estimates, the average American consumes 12 teaspoons of sugar a day, which equates to about TWO TONS of sugar during a lifetime.  8.   Eat Healthy Fats By healthy, this means not only mon- and polyunsaturated fats, like that found in avocadoes and nuts, but also saturated, as these are essential for building testosterone. Research shows that a diet with less than 40 percent of energy as fat (and that mainly from animal sources, i.e. saturated) lead to a decrease in testosterone levels.8 My personal diet is about 60-70 percent healthy fat, and other experts agree that the ideal diet  includes somewhere between 50-70 percent fat.  It's important to understand that your body requires saturated fats from animal and vegetable sources (such as meat, dairy, certain oils, and tropical plants like coconut) for optimal functioning, and if you neglect this important food group in favor of sugar, grains and other starchy carbs, your health and weight are almost guaranteed to suffer. Examples of healthy fats you can eat more of to give your testosterone levels a boost include: Olives and Olive oil  Coconuts and coconut oil Butter made from raw grass-fed organic milk Raw nuts, such as, almonds or pecans Organic pastured egg yolks Avocados Grass-fed meats Palm oil Unheated organic nut oils   9.   Boost Your Intake of Branch Chain Amino Acids (BCAA) from Foods Like Whey Protein Research suggests that BCAAs result in higher testosterone levels, particularly when taken along with resistance training.9 While BCAAs are available in supplement form, you'll find the highest concentrations of BCAAs like leucine in dairy products - especially quality cheeses and whey protein. Even when getting leucine from your natural food supply, it's often wasted or used as a building block instead of an anabolic agent. So  to create the correct anabolic environment, you need to boost leucine consumption way beyond mere maintenance levels. That said, keep in mind that using leucine as a free form amino acid can be highly counterproductive as when free form amino acids are artificially administrated, they rapidly enter your circulation while disrupting insulin function, and impairing your body's glycemic control. Food-based leucine is really the ideal form that can benefit your muscles without side effects.

## 2020-12-02 ENCOUNTER — Encounter: Payer: Self-pay | Admitting: Adult Health

## 2020-12-02 DIAGNOSIS — R5383 Other fatigue: Secondary | ICD-10-CM | POA: Insufficient documentation

## 2020-12-02 DIAGNOSIS — E349 Endocrine disorder, unspecified: Secondary | ICD-10-CM | POA: Insufficient documentation

## 2020-12-02 DIAGNOSIS — G478 Other sleep disorders: Secondary | ICD-10-CM | POA: Insufficient documentation

## 2020-12-02 LAB — COMPLETE METABOLIC PANEL WITH GFR
AG Ratio: 1.9 (calc) (ref 1.0–2.5)
ALT: 29 U/L (ref 9–46)
AST: 16 U/L (ref 10–40)
Albumin: 5.2 g/dL — ABNORMAL HIGH (ref 3.6–5.1)
Alkaline phosphatase (APISO): 47 U/L (ref 36–130)
BUN: 15 mg/dL (ref 7–25)
CO2: 26 mmol/L (ref 20–32)
Calcium: 10.5 mg/dL — ABNORMAL HIGH (ref 8.6–10.3)
Chloride: 102 mmol/L (ref 98–110)
Creat: 0.95 mg/dL (ref 0.60–1.35)
GFR, Est African American: 120 mL/min/{1.73_m2} (ref >=60)
GFR, Est Non African American: 103 mL/min/{1.73_m2} (ref >=60)
Globulin: 2.8 g/dL (calc) (ref 1.9–3.7)
Glucose, Bld: 105 mg/dL — ABNORMAL HIGH (ref 65–99)
Potassium: 4.4 mmol/L (ref 3.5–5.3)
Sodium: 139 mmol/L (ref 135–146)
Total Bilirubin: 0.8 mg/dL (ref 0.2–1.2)
Total Protein: 8 g/dL (ref 6.1–8.1)

## 2020-12-02 LAB — MAGNESIUM: Magnesium: 2.2 mg/dL (ref 1.5–2.5)

## 2020-12-02 LAB — CBC WITH DIFFERENTIAL/PLATELET
Absolute Monocytes: 748 cells/uL (ref 200–950)
Basophils Absolute: 62 cells/uL (ref 0–200)
Basophils Relative: 0.7 %
Eosinophils Absolute: 211 cells/uL (ref 15–500)
Eosinophils Relative: 2.4 %
HCT: 45.1 % (ref 38.5–50.0)
Hemoglobin: 15.2 g/dL (ref 13.2–17.1)
Lymphs Abs: 2790 cells/uL (ref 850–3900)
MCH: 29.1 pg (ref 27.0–33.0)
MCHC: 33.7 g/dL (ref 32.0–36.0)
MCV: 86.2 fL (ref 80.0–100.0)
MPV: 9.7 fL (ref 7.5–12.5)
Monocytes Relative: 8.5 %
Neutro Abs: 4990 cells/uL (ref 1500–7800)
Neutrophils Relative %: 56.7 %
Platelets: 321 10*3/uL (ref 140–400)
RBC: 5.23 10*6/uL (ref 4.20–5.80)
RDW: 13.1 % (ref 11.0–15.0)
Total Lymphocyte: 31.7 %
WBC: 8.8 10*3/uL (ref 3.8–10.8)

## 2020-12-02 LAB — LIPID PANEL
Cholesterol: 155 mg/dL (ref <200)
HDL: 39 mg/dL — ABNORMAL LOW (ref >=40)
LDL Cholesterol (Calc): 96 mg/dL (calc)
Non-HDL Cholesterol (Calc): 116 mg/dL (calc) (ref <130)
Total CHOL/HDL Ratio: 4 (calc) (ref <5.0)
Triglycerides: 106 mg/dL (ref <150)

## 2020-12-02 LAB — TESTOSTERONE: Testosterone: 180 ng/dL — ABNORMAL LOW (ref 250–827)

## 2020-12-02 LAB — HEMOGLOBIN A1C
Hgb A1c MFr Bld: 8.3 % of total Hgb — ABNORMAL HIGH (ref <5.7)
Mean Plasma Glucose: 192 mg/dL
eAG (mmol/L): 10.6 mmol/L

## 2020-12-02 LAB — TSH: TSH: 2.56 mIU/L (ref 0.40–4.50)

## 2020-12-15 ENCOUNTER — Other Ambulatory Visit: Payer: Self-pay | Admitting: Adult Health

## 2020-12-15 MED ORDER — TRIAMCINOLONE ACETONIDE 0.5 % EX CREA: 1.0000 "application " | TOPICAL_CREAM | Freq: Two times a day (BID) | CUTANEOUS | 2 refills | Status: DC

## 2020-12-19 ENCOUNTER — Other Ambulatory Visit: Payer: Self-pay | Admitting: Internal Medicine

## 2020-12-19 MED ORDER — DEXAMETHASONE 4 MG PO TABS: ORAL_TABLET | ORAL | 0 refills | Status: DC

## 2021-01-07 ENCOUNTER — Other Ambulatory Visit: Payer: Self-pay | Admitting: Internal Medicine

## 2021-01-07 ENCOUNTER — Other Ambulatory Visit: Payer: Self-pay

## 2021-01-07 DIAGNOSIS — K219 Gastro-esophageal reflux disease without esophagitis: Secondary | ICD-10-CM

## 2021-01-07 MED ORDER — OMEPRAZOLE 40 MG PO CPDR
DELAYED_RELEASE_CAPSULE | ORAL | 1 refills | Status: DC
Start: 2021-01-07 — End: 2021-07-17

## 2021-01-07 MED ORDER — LISINOPRIL 40 MG PO TABS: ORAL_TABLET | ORAL | 1 refills | Status: DC

## 2021-01-30 ENCOUNTER — Ambulatory Visit
Admission: RE | Admit: 2021-01-30 | Discharge: 2021-01-30 | Disposition: A | Discharge status: Home / Self Care | Payer: No Typology Code available for payment source | Source: Ambulatory Visit | Attending: Adult Health | Admitting: Adult Health

## 2021-01-30 ENCOUNTER — Other Ambulatory Visit: Payer: Self-pay | Admitting: Adult Health

## 2021-01-30 DIAGNOSIS — R059 Cough, unspecified: Secondary | ICD-10-CM

## 2021-02-12 ENCOUNTER — Ambulatory Visit: Payer: No Typology Code available for payment source | Admitting: Adult Health

## 2021-02-18 ENCOUNTER — Other Ambulatory Visit: Payer: Self-pay

## 2021-02-18 DIAGNOSIS — E785 Hyperlipidemia, unspecified: Secondary | ICD-10-CM

## 2021-02-18 DIAGNOSIS — E1169 Type 2 diabetes mellitus with other specified complication: Secondary | ICD-10-CM

## 2021-02-18 MED ORDER — EMPAGLIFLOZIN 25 MG PO TABS: ORAL_TABLET | ORAL | 1 refills | Status: DC

## 2021-02-18 MED ORDER — ROSUVASTATIN CALCIUM 40 MG PO TABS: ORAL_TABLET | ORAL | 1 refills | Status: DC

## 2021-03-01 ENCOUNTER — Encounter: Payer: Self-pay | Admitting: Adult Health

## 2021-03-08 ENCOUNTER — Other Ambulatory Visit: Payer: Self-pay

## 2021-03-08 ENCOUNTER — Ambulatory Visit: Payer: No Typology Code available for payment source | Admitting: Adult Health

## 2021-03-08 ENCOUNTER — Encounter: Payer: Self-pay | Admitting: Adult Health

## 2021-03-08 VITALS — BP 132/74 | HR 85 | Temp 96.4°F | Wt 319.0 lb

## 2021-03-08 DIAGNOSIS — E1159 Type 2 diabetes mellitus with other circulatory complications: Secondary | ICD-10-CM

## 2021-03-08 DIAGNOSIS — E559 Vitamin D deficiency, unspecified: Secondary | ICD-10-CM

## 2021-03-08 DIAGNOSIS — Z79899 Other long term (current) drug therapy: Secondary | ICD-10-CM

## 2021-03-08 DIAGNOSIS — E1169 Type 2 diabetes mellitus with other specified complication: Secondary | ICD-10-CM | POA: Diagnosis not present

## 2021-03-08 DIAGNOSIS — E785 Hyperlipidemia, unspecified: Secondary | ICD-10-CM

## 2021-03-08 DIAGNOSIS — I10 Essential (primary) hypertension: Secondary | ICD-10-CM

## 2021-03-08 DIAGNOSIS — E349 Endocrine disorder, unspecified: Secondary | ICD-10-CM

## 2021-03-08 DIAGNOSIS — M778 Other enthesopathies, not elsewhere classified: Secondary | ICD-10-CM

## 2021-03-08 DIAGNOSIS — M7582 Other shoulder lesions, left shoulder: Secondary | ICD-10-CM | POA: Insufficient documentation

## 2021-03-08 HISTORY — DX: Vitamin D deficiency, unspecified: E55.9

## 2021-03-08 MED ORDER — MELOXICAM 15 MG PO TABS: ORAL_TABLET | ORAL | 1 refills | Status: DC

## 2021-03-08 NOTE — Patient Instructions (Signed)
Shoulder Impingement Syndrome Rehab Ask your health care provider which exercises are safe for you. Do exercises exactly as told by your health care provider and adjust them as directed. It is normal to feel mild stretching, pulling, tightness, or discomfort as you do these exercises. Stop right away if you feel sudden pain or your pain gets worse. Do not begin these exercises until told by your health care provider. Stretching and range-of-motion exercise This exercise warms up your muscles and joints and improves the movement and flexibility of your shoulder. This exercise also helps to relieve pain and stiffness. Passive horizontal adduction In passive adduction, you use your other hand to move the injured arm toward your body. The injured arm does not move on its own. In this movement, your arm is moved across your body in the horizontal plane (horizontal adduction). 1. Sit or stand and pull your left / right elbow across your chest, toward your other shoulder. Stop when you feel a gentle stretch in the back of your shoulder and upper arm. ? Keep your arm at shoulder height. ? Keep your arm as close to your body as you comfortably can. 2. Hold for __________ seconds. 3. Slowly return to the starting position. Repeat __________ times. Complete this exercise __________ times a day.   Strengthening exercises These exercises build strength and endurance in your shoulder. Endurance is the ability to use your muscles for a long time, even after they get tired. External rotation, isometric This is an exercise in which you press the back of your wrist against a door frame without moving your shoulder joint (isometric). 1. Stand or sit in a doorway, facing the door frame. 2. Bend your left / right elbow and place the back of your wrist against the door frame. Only the back of your wrist should be touching the frame. Keep your upper arm at your side. 3. Gently press your wrist against the door frame, as  if you are trying to push your arm away from your abdomen (external rotation). Press as hard as you are able without pain. ? Avoid shrugging your shoulder while you press your wrist against the door frame. Keep your shoulder blade tucked down toward the middle of your back. 4. Hold for __________ seconds. 5. Slowly release the tension, and relax your muscles completely before you repeat the exercise. Repeat __________ times. Complete this exercise __________ times a day. Internal rotation, isometric This is an exercise in which you press your palm against a door frame without moving your shoulder joint (isometric). 1. Stand or sit in a doorway, facing the door frame. 2. Bend your left / right elbow and place the palm of your hand against the door frame. Only your palm should be touching the frame. Keep your upper arm at your side. 3. Gently press your hand against the door frame, as if you are trying to push your arm toward your abdomen (internal rotation). Press as hard as you are able without pain. ? Avoid shrugging your shoulder while you press your hand against the door frame. Keep your shoulder blade tucked down toward the middle of your back. 4. Hold for __________ seconds. 5. Slowly release the tension, and relax your muscles completely before you repeat the exercise. Repeat __________ times. Complete this exercise __________ times a day.   Scapular protraction, supine 1. Lie on your back on a firm surface (supine position). Hold a __________ weight in your left / right hand. 2. Raise your left /   right arm straight into the air so your hand is directly above your shoulder joint. 3. Push the weight into the air so your shoulder (scapula) lifts off the surface that you are lying on. The scapula will push up or forward (protraction). Do not move your head, neck, or back. 4. Hold for __________ seconds. 5. Slowly return to the starting position. Let your muscles relax completely before you  repeat this exercise. Repeat __________ times. Complete this exercise __________ times a day.   Scapular retraction 1. Sit in a stable chair without armrests, or stand up. 2. Secure an exercise band to a stable object in front of you so the band is at shoulder height. 3. Hold one end of the exercise band in each hand. Your palms should face down. 4. Squeeze your shoulder blades together (retraction) and move your elbows slightly behind you. Do not shrug your shoulders upward while you do this. 5. Hold for __________ seconds. 6. Slowly return to the starting position. Repeat __________ times. Complete this exercise __________ times a day.   Shoulder extension 1. Sit in a stable chair without armrests, or stand up. 2. Secure an exercise band to a stable object in front of you so the band is above shoulder height. 3. Hold one end of the exercise band in each hand. 4. Straighten your elbows and lift your hands up to shoulder height. 5. Squeeze your shoulder blades together and pull your hands down to the sides of your thighs (extension). Stop when your hands are straight down by your sides. Do not let your hands go behind your body. 6. Hold for __________ seconds. 7. Slowly return to the starting position. Repeat __________ times. Complete this exercise __________ times a day.   This information is not intended to replace advice given to you by your health care provider. Make sure you discuss any questions you have with your health care provider. Document Revised: 01/15/2019 Document Reviewed: 10/19/2018 Elsevier Patient Education  2021 [employer] Inc.  

## 2021-03-08 NOTE — Progress Notes (Signed)
FOLLOW UP  Assessment and Plan:   Hypertension, unspecified type Continue medication Monitor blood pressure at home; call if consistently over 130/80 Continue DASH diet.   Reminder to go to the ER if any CP, SOB, nausea, dizziness, severe HA, changes vision/speech, left arm numbness and tingling and jaw pain. -     COMPLETE METABOLIC PANEL WITH GFR -     Magnesium  Type 2 diabetes mellitus with other specified complication, without long-term current use of insulin [facility]) Education: Reviewed 'ABCs' of diabetes management (respective goals in parentheses):  A1C (<7), blood pressure (<130/80), and cholesterol (LDL <70) Eye Exam yearly and Dental Exam every 6 months. Dietary recommendations Physical Activity recommendations Continue metformin and jardiance Didn't tolerate ozempic, Bcise strongly motivated to work on lifestyle and we are willing to work with this as long as we continue to see progress with lifestyle modification and weight loss going forward NEEDS TO CHECK FASTING - minimum 2-3 days/week, keep a log -     Hemoglobin A1c   History of CVA (cerebrovascular accident) Avoid vasocontrictive meds; continue ASA;  Control blood pressure, cholesterol, glucose, increase exercise.  -     Lipid panel  Morbid obesity (HCC) Long discussion about weight loss, diet, and exercise Discussed appropriate weight for height and initial goal (<300lb) - he is confident he can achieve this in the next 3-6 months Intolerant of phentermine, topamax, GLP-1i;  Work on improving stress/sleep patterns Discussed macros, portions, mindful eating; he is making small sustainable changes, doing well  Follow up at next visit  Hyperlipidemia associated with type 2 diabetes mellitus (HCC) Continue medications: rosuvastatin 40 mg daily LDL goal <70, working on lifestyle Continue low cholesterol diet and exercise.  Check lipid panel.  -     Lipid panel -     TSH  Vitamin D deficiency Continue vitamin  D supplement for goal 60-100 Check vitamin D level at CPE  Fatigue B12 was borderline, get on SL supplement Low testosterone - on zinc, deferring supplement, working on lifestyle/ weight loss  Left shoulder tendonitis Avoid aggravating movements, meloxicam, rest, then slowly start rotator cuff exercises; sports med referral if not improving  -     meloxicam (MOBIC) 15 MG tablet; Take one daily with food for 4 weeks, then 1/2-1 tab daily as needed.  Continue diet and meds as discussed. Further disposition pending results of labs. Discussed med's effects and SE's.   Over 30 minutes of exam, counseling, chart review, and critical decision making was performed.   Future Appointments  Date Time Provider Department Center  07/30/2021  3:00 PM Judd Gaudier, NP [id number] None    ----------------------------------------------------------------------------------------------------------------------  HPI 35 y.o. male  presents for 3 month follow up on hypertension, cholesterol, diabetes, morbid obesity and vitamin D deficiency.   He is concerned about left posterior shoulder pain, mild intermittent x 5-6 months. Hasn't tried anything. Denies injury prior to onset; denies neck pain or radicular sx- numbness/tingling/weakness.   He has had 2 episodes of TIA, suspected r/t vasoconstrictive meds after otherwise unremarkable workup by neurology Dr. Pearlean Brownie, concluded cerebral reversible vasoconstriction syndrome and advised to avoid sudafed, phentermine.   GERD has been fairly controlled by omeprazole 40 mg daily. He had abd [place] 10/2020 for bilirubin elevatinon showing diffuse increased echogenicity, also focal angular lesion in the central RIGHT hepatic lobe measuring 3.8 cm that was recommended for MRI, has with order w/wo contrast and pending, patient postponed due to insurance/cost.   BMI is Body mass index is  42.67 kg/m., he reports back on diet, trying to get back into a diet, trying to keep it  simple, doing veggies, sugar free fruit.  Trying to reduce eating out, eating less burgers, fast food, packing lunch.  Intolerant topamax in the past (memory), phentermine contraindicated Down 10 lb since last visit   Goal is <300 lb by next visit per patient Water intake: over a gallon daily, or unsweet tea, black coffee, <1 soda/week Alcohol: none in the last 3 months Gets on scale weekly  Trying to increase walking, 1 mile a few days a week Wt Readings from Last 3 Encounters:  03/08/21 (!) 319 lb (144.7 kg)  12/01/20 (!) 329 lb (149.2 kg)  10/16/20 (!) 327 lb (148.3 kg)   His blood pressure has been controlled at home (125-130/80s), today their BP is BP: 132/74  He does workout. He denies chest pain, shortness of breath, dizziness.   He is on cholesterol medication Rosuvastatin 40 mg daily and denies myalgias. His cholesterol is not at goal of LDL <70. The cholesterol last visit was:   Lab Results  Component Value Date   CHOL 155 12/01/2020   HDL 39 (L) 12/01/2020   LDLCALC 96 12/01/2020   TRIG 106 12/01/2020   CHOLHDL 4.0 12/01/2020    He has been working on diet and exercise for T2DM on metformin 2000 mg daily, (SE with bydureon, ozempic), on jardiance 25 mg working aggressively on lifestyle per patient preference, and denies increased appetite, nausea, paresthesia of the feet, polydipsia, polyuria, visual disturbances and vomiting.  He admits hasn't been checking glucose, a few weeks ago was 150.  Last in the office was:  Lab Results  Component Value Date   HGBA1C 8.3 (H) 12/01/2020    Last GFR:  Lab Results  Component Value Date   GFRNONAA 103 12/01/2020   Patient is on Vitamin D supplement, taking 81275 IU daily. Lab Results  Component Value Date   VD25OH 52 07/27/2020      Hasn't started on supplement -  Lab Results  Component Value Date   VITAMINB12 386 10/16/2020   He has a history of testosterone deficiency and is NOT on testosterone, newly on zinc  supplement. Lab Results  Component Value Date   TESTOSTERONE 180 (L) 12/01/2020     Current Medications:  Current Outpatient Medications on File Prior to Visit  Medication Sig  . aspirin 81 MG chewable tablet Chew 81 mg by mouth daily.   . Cholecalciferol (VITAMIN D3) 250 MCG (10000 UT) capsule Take 10,000 Units by mouth daily.  . empagliflozin (JARDIANCE) 25 MG TABS tablet Take  1 tablet  Daily  for Diabetes  . fluticasone (FLONASE) 50 MCG/ACT nasal spray INSTILL TWO SPRAYS IN EACH NOSTRIL TWICE A DAY  . glucose blood (FREESTYLE LITE) test strip Test sugar once daily  . lisinopril (ZESTRIL) 40 MG tablet Take  1 tablet  Daily  for BP  . metFORMIN (GLUCOPHAGE) 500 MG tablet TAKE TWO TABLETS BY MOUTH TWICE A DAY WITH MEALS FOR DIABETES  . omeprazole (PRILOSEC) 40 MG capsule Take  1 capsule  Daily  to Prevent Heartburn & Indigestion  . rosuvastatin (CRESTOR) 40 MG tablet Take  1 tablet  Daily  for Cholesterol  . ketoconazole (NIZORAL) 2 % cream Apply 1 application topically 2 (two) times daily. (Patient not taking: Reported on 03/08/2021)   No current facility-administered medications on file prior to visit.     Allergies:  Allergies  Allergen Reactions  . Bisoprolol  Numbness, tingling, feels bad  . Codeine Swelling     Medical History:  Past Medical History:  Diagnosis Date  . Diabetes mellitus without complication (HCC)   . Former smoker (quit 07/2018, 7.5 pack year history) 07/17/2018  . History of COVID-19 11/15/2019  . Hypertension   . Morbid obesity (HCC)   . Stroke [facility])    "mini stroke"   Family history- Reviewed and unchanged Social history- Reviewed and unchanged   Review of Systems:  Review of Systems  Constitutional: Negative for malaise/fatigue and weight loss.  HENT: Negative for hearing loss and tinnitus.   Eyes: Negative for blurred vision and double vision.  Respiratory: Negative for cough, shortness of breath and wheezing.   Cardiovascular:  Negative for chest pain, palpitations, orthopnea, claudication and leg swelling.  Gastrointestinal: Negative for abdominal pain, blood in stool, constipation, diarrhea, heartburn, melena, nausea and vomiting.  Genitourinary: Negative.   Musculoskeletal: Positive for joint pain (L shoulder with external rotation and abduction, intermittent). Negative for myalgias.  Skin: Negative for rash.  Neurological: Negative for dizziness, tingling, sensory change, weakness and headaches.  Endo/Heme/Allergies: Negative for polydipsia.  Psychiatric/Behavioral: Negative.   All other systems reviewed and are negative.   Physical Exam: BP 132/74   Pulse 85   Temp (!) 96.4 F (35.8 C)   Wt (!) 319 lb (144.7 kg)   SpO2 96%   BMI 42.67 kg/m  Wt Readings from Last 3 Encounters:  03/08/21 (!) 319 lb (144.7 kg)  12/01/20 (!) 329 lb (149.2 kg)  10/16/20 (!) 327 lb (148.3 kg)   General Appearance: Well nourished, well dressed, morbidly obese young male in no apparent distress. Eyes: PERRLA, EOMs, conjunctiva no swelling or erythema Sinuses: No Frontal/maxillary tenderness ENT/Mouth: Ext aud canals clear, TMs without erythema, bulging. Mask in place; oral exam deferred. Hearing normal.  Neck: Supple, thyroid normal.  Respiratory: Respiratory effort normal, BS equal bilaterally without rales, rhonchi, wheezing or stridor.  Cardio: RRR with no MRGs. Brisk peripheral pulses without edema.  Abdomen: Soft, obese abdomen, + BS.  Non tender, no guarding, rebound, hernias, masses. Lymphatics: Non tender without lymphadenopathy.  Musculoskeletal: Full ROM, 5/5 strength, Normal gait. L shoulder without palpable bony abnormality, effusion, popping, clicking; normal external rotation against resistant, negative empty can; pain with liftoff against resistance.  Skin: Warm, dry without rashes, lesions, ecchymosis.  Neuro: Cranial nerves intact. No cerebellar symptoms.  Psych: Awake and oriented X 3, normal affect,  Insight and Judgment appropriate.    Dan Maker, NP 2:42 PM [facility]

## 2021-03-09 LAB — COMPLETE METABOLIC PANEL WITH GFR
AG Ratio: 1.9 (calc) (ref 1.0–2.5)
ALT: 30 U/L (ref 9–46)
AST: 18 U/L (ref 10–40)
Albumin: 4.9 g/dL (ref 3.6–5.1)
Alkaline phosphatase (APISO): 73 U/L (ref 36–130)
BUN: 19 mg/dL (ref 7–25)
CO2: 25 mmol/L (ref 20–32)
Calcium: 10.4 mg/dL — ABNORMAL HIGH (ref 8.6–10.3)
Chloride: 101 mmol/L (ref 98–110)
Creat: 1 mg/dL (ref 0.60–1.35)
GFR, Est African American: 113 mL/min/{1.73_m2} (ref >=60)
GFR, Est Non African American: 97 mL/min/{1.73_m2} (ref >=60)
Globulin: 2.6 g/dL (calc) (ref 1.9–3.7)
Glucose, Bld: 224 mg/dL — ABNORMAL HIGH (ref 65–99)
Potassium: 4.6 mmol/L (ref 3.5–5.3)
Sodium: 139 mmol/L (ref 135–146)
Total Bilirubin: 0.6 mg/dL (ref 0.2–1.2)
Total Protein: 7.5 g/dL (ref 6.1–8.1)

## 2021-03-09 LAB — LIPID PANEL
Cholesterol: 99 mg/dL (ref <200)
HDL: 22 mg/dL — ABNORMAL LOW (ref >=40)
LDL Cholesterol (Calc): 36 mg/dL (calc)
Non-HDL Cholesterol (Calc): 77 mg/dL (calc) (ref <130)
Total CHOL/HDL Ratio: 4.5 (calc) (ref <5.0)
Triglycerides: 385 mg/dL — ABNORMAL HIGH (ref <150)

## 2021-03-09 LAB — CBC WITH DIFFERENTIAL/PLATELET
Absolute Monocytes: 491 cells/uL (ref 200–950)
Basophils Absolute: 62 cells/uL (ref 0–200)
Basophils Relative: 0.8 %
Eosinophils Absolute: 218 cells/uL (ref 15–500)
Eosinophils Relative: 2.8 %
HCT: 41.4 % (ref 38.5–50.0)
Hemoglobin: 13.5 g/dL (ref 13.2–17.1)
Lymphs Abs: 3112 cells/uL (ref 850–3900)
MCH: 28 pg (ref 27.0–33.0)
MCHC: 32.6 g/dL (ref 32.0–36.0)
MCV: 85.7 fL (ref 80.0–100.0)
MPV: 9.4 fL (ref 7.5–12.5)
Monocytes Relative: 6.3 %
Neutro Abs: 3916 cells/uL (ref 1500–7800)
Neutrophils Relative %: 50.2 %
Platelets: 356 10*3/uL (ref 140–400)
RBC: 4.83 10*6/uL (ref 4.20–5.80)
RDW: 13 % (ref 11.0–15.0)
Total Lymphocyte: 39.9 %
WBC: 7.8 10*3/uL (ref 3.8–10.8)

## 2021-03-09 LAB — HEMOGLOBIN A1C
Hgb A1c MFr Bld: 6.7 % of total Hgb — ABNORMAL HIGH (ref <5.7)
Mean Plasma Glucose: 146 mg/dL
eAG (mmol/L): 8.1 mmol/L

## 2021-03-09 LAB — TSH: TSH: 1.61 mIU/L (ref 0.40–4.50)

## 2021-03-09 LAB — MAGNESIUM: Magnesium: 2.1 mg/dL (ref 1.5–2.5)

## 2021-03-24 ENCOUNTER — Other Ambulatory Visit: Payer: Self-pay

## 2021-03-24 DIAGNOSIS — E1159 Type 2 diabetes mellitus with other circulatory complications: Secondary | ICD-10-CM

## 2021-03-24 MED ORDER — METFORMIN HCL 500 MG PO TABS: ORAL_TABLET | ORAL | 3 refills | Status: DC

## 2021-04-16 ENCOUNTER — Other Ambulatory Visit: Payer: Self-pay | Admitting: Adult Health

## 2021-05-15 ENCOUNTER — Other Ambulatory Visit: Payer: Self-pay | Admitting: Internal Medicine

## 2021-05-15 ENCOUNTER — Other Ambulatory Visit: Payer: Self-pay | Admitting: Adult Health

## 2021-07-17 ENCOUNTER — Other Ambulatory Visit: Payer: Self-pay | Admitting: Adult Health

## 2021-07-17 ENCOUNTER — Other Ambulatory Visit: Payer: Self-pay | Admitting: Internal Medicine

## 2021-07-17 DIAGNOSIS — E1169 Type 2 diabetes mellitus with other specified complication: Secondary | ICD-10-CM

## 2021-07-17 DIAGNOSIS — K219 Gastro-esophageal reflux disease without esophagitis: Secondary | ICD-10-CM

## 2021-07-17 DIAGNOSIS — E785 Hyperlipidemia, unspecified: Secondary | ICD-10-CM

## 2021-07-27 ENCOUNTER — Encounter: Payer: Self-pay | Admitting: Adult Health

## 2021-07-27 NOTE — Progress Notes (Signed)
Complete Physical  Assessment and Plan:  Jared Ramsey was seen today for annual exam  Diagnoses and all orders for this visit:  Encounter for Annual Physical Exam with abnormal findings Due annually  Health Maintenance reviewed Healthy lifestyle reviewed and goals set  Hypertension, unspecified type Continue meds Monitor blood pressure at home; call if consistently over 130/80 Continue DASH diet.   Reminder to go to the ER if any CP, SOB, nausea, dizziness, severe HA, changes vision/speech, left arm numbness and tingling and jaw pain. -     CBC with Differential/Platelet -     COMPLETE METABOLIC PANEL WITH GFR -     Magnesium  Type 2 diabetes mellitus with other specified complication, without long-term current use of insulin [facility]) Education: Reviewed 'ABCs' of diabetes management (respective goals in parentheses):  A1C (<7), blood pressure (<130/80), and cholesterol (LDL <70) Eye Exam yearly and Dental Exam every 6 months. Dietary recommendations Physical Activity recommendations -     Hemoglobin A1c -     Urinalysis, Routine w reflex microscopic  Hyperlipidemia associated with type 2 diabetes mellitus (HCC) Continue medications: rosuvastatin 40 mg daily LDL goal <70, may be able to reduce dose if remains very low Continue low cholesterol diet and exercise.  Check lipid panel.  -     Lipid panel -     TSH  History of CVA (cerebrovascular accident) Has seen neurology Dr. Leonie Man  Attributed to vasoactive medication; avoid vasoconstrictive agents, sudafed, phentermiene Continue ASA No suspected recurrence Control blood pressure, cholesterol, glucose, increase exercise.  -     Lipid panel  Morbid obesity ([place])- BMI 42 with T2DM, hld Long discussion about weight loss, diet, and exercise Recommended diet heavy in fruits and veggies and low in animal meats, cheeses, and dairy products, appropriate calorie intake Patient will work on cutting down on portions, avoiding binge  eating/drinking Discussed appropriate weight for height and initial goal (<300lb) Couldn't tolerate ozempic, bydurion Declines meds, wants to try monthly weigh in for accountability and consistency High fiber diet handout given  Follow up at next visit  Environmental allergies Continue OTC allergy pills  Alcohol consumption binge drinking Has stopped binge drinking; monitor   Former smoker Has quit smoking Strategies for ongoing success discussed  Right carotid atherosclerosis [place] ordered and pending, plans to get end of the year Control blood pressure, cholesterol, glucose, increase exercise.  -     Lipid panel  GERD Well managed on current medications;  Discussed diet, avoiding triggers and other lifestyle changes  Orders Placed This Encounter  Procedures   MR Abdomen W Wo Contrast   Flu Vaccine QUAD 6+ mos PF IM (Fluarix Quad PF)   CBC with Differential/Platelet   COMPLETE METABOLIC PANEL WITH GFR   Magnesium   Lipid panel   TSH   Hemoglobin A1c   VITAMIN D 25 Hydroxy (Vit-D Deficiency, Fractures)   Microalbumin / creatinine urine ratio   Urinalysis, Routine w reflex microscopic   Testosterone   Ambulatory referral to Urology   EKG 12-Lead    Discussed med's effects and SE's. Screening labs and tests as requested with regular follow-up as recommended. Over 40 minutes of exam, counseling, chart review and critical decision making was performed   Future Appointments  Date Time Provider [place] [facility]  09/04/2021  2:30 PM [facility] NURSE [facility] None  10/04/2021  2:30 PM [facility] NURSE [facility] None  11/06/2021  2:30 PM Liane Comber, NP [facility] None    HPI 35 y.o. male patient presents for  CPE. He has Hypertension; History of CVA (cerebrovascular accident); Morbid obesity ([place]); Environmental allergies; Stenosis of right carotid artery; Type 2 diabetes mellitus with circulatory disorder ([place]); Hyperlipidemia associated with type 2 diabetes  mellitus ([place]); Acid reflux; Liver lesion, right lobe; Fatty liver; Testosterone deficiency; Fatigue; Poor sleep pattern; Vitamin D deficiency; and Left shoulder tendonitis on their problem list.   He is married, has 87 y/o daughter, baby boy was born 02/2021, sleeping well. he is a Games developer, working better hours. He is requesting referral for vasectomy.   In 07/2018 he was evaluated in ED for right sided headache and extremity heaviness. CTA head showed Subtle low-density in the superior paramedian right cerebellum, compared to MRI in 2017. CTA showed Mild noncalcified plaque at the right common carotid bifurcationand left vertebral origin. He was evaluated by Dr. Leonie Man and concluded episode of left hemispheric TIA in October 2019 likely from vasoconstriction from Sudafed use.  Prior history of likely right cerebellar infarct in 2017 secondary to cerebral reversible vasoconstriction syndrome. He was recommended aggressive lifestyle modification and avoidance of vasoconstrictive agents, sudafed, phentermine.  History of binge drinking (12-24 on weekend), significantly reduced, now max 6 in a week. Former smoker; 7.5 pack year history, quit in 07/2018 and doing well.   He has pepcid 20 mg daily, 2nd PRN for reflux and doing well. Very rarely takes reglan.   He has known fatty liver per [place] 10/2020; also showed focal angular lesion in the central RIGHT hepatic lobe measuring 3.8 cm, MRI was ordered but remains pending, he plans to schedule prior to the end of the year.   BMI is Body mass index is 43.4 kg/m., he admits hasn't been working on diet/exericse, would like to try coming in for monthly checks for accountability.  Wt Readings from Last 3 Encounters:  07/31/21 (!) 322 lb 3.2 oz (146.1 kg)  03/08/21 (!) 319 lb (144.7 kg)  12/01/20 (!) 329 lb (149.2 kg)   His blood pressure has been controlled at home, today their BP is BP: 130/84 He does workout. He denies chest pain, shortness of  breath, dizziness.   He is on cholesterol medication (rosuvastatin 40 mg daily) and denies myalgias.  His cholesterol is at LDL goal of <70. The cholesterol last visit was:   Lab Results  Component Value Date   CHOL 99 03/08/2021   HDL 22 (L) 03/08/2021   LDLCALC 36 03/08/2021   TRIG 385 (H) 03/08/2021   CHOLHDL 4.5 03/08/2021    He has been working on diet and exercise for T2DM with circulatory complication, treated by metformin 2000 mg daily, and jardiance 25 mg, had nausea with ozempic, trulicity at starting dose and was unable to tolerate, he is on bASA, he is on ACE/ARB and denies foot ulcerations, increased appetite, nausea, polydipsia, polyuria, visual disturbances and vomiting. Admits hasn't been checking fasting glucose  Last A1C in the office was:  Lab Results  Component Value Date   HGBA1C 6.7 (H) 03/08/2021    Last GFR: Lab Results  Component Value Date   GFRNONAA 97 03/08/2021   Taking 10000 IU  Lab Results  Component Value Date   VD25OH 52 07/27/2020     He is on zinc supplement since last OV;  Lab Results  Component Value Date   TESTOSTERONE 180 (L) 12/01/2020      Current Medications:  Current Outpatient Medications on File Prior to Visit  Medication Sig Dispense Refill   aspirin 81 MG chewable tablet Chew 81 mg  by mouth daily.      Cholecalciferol (VITAMIN D3) 250 MCG (10000 UT) capsule Take 10,000 Units by mouth daily.     empagliflozin (JARDIANCE) 25 MG TABS tablet Take  1 tablet  Daily  for Diabetes 90 tablet 3   fluticasone (FLONASE) 50 MCG/ACT nasal spray INSTILL TWO SPRAYS IN EACH NOSTRIL TWICE A DAY 16 g 2   glucose blood (FREESTYLE LITE) test strip Test sugar once daily 50 each 12   lisinopril (ZESTRIL) 40 MG tablet TAKE ONE TABLET BY MOUTH DAILY FOR BLOOD PRESSURE 90 tablet 0   metFORMIN (GLUCOPHAGE) 500 MG tablet Take  2 tablets  2 x /day  with Meals  for Diabetes 360 tablet 3   omeprazole (PRILOSEC) 40 MG capsule TAKE ONE CAPSULE BY MOUTH  DAILY TO PREVENT HEARTBURN AND INDIGESTION 90 capsule 0   rosuvastatin (CRESTOR) 40 MG tablet TAKE ONE TABLET BY MOUTH DAILY FOR HIGH CHOLESTEROL 90 tablet 0   zinc gluconate 50 MG tablet Take 50 mg by mouth daily.     ketoconazole (NIZORAL) 2 % cream Apply 1 application topically 2 (two) times daily. (Patient not taking: No sig reported) 60 g 0   meloxicam (MOBIC) 15 MG tablet Take  1/2 to  1 tablet  Daily  with Food for  Pain & Inflammation & try limit to 5 days /week to avoid Kidney Damage (Patient not taking: Reported on 07/31/2021) 90 tablet 1   No current facility-administered medications on file prior to visit.   Allergies:  Allergies  Allergen Reactions   Bisoprolol     Numbness, tingling, feels bad   Codeine Swelling   Health Maintenance:  Immunization History  Administered Date(s) Administered   Hepatitis A 07/11/2008, 05/30/2009   Hepatitis B 08/23/1997, 01/24/1998, 07/19/1998   IPV 07/21/2008   Influenza Inj Mdck Quad With Preservative 07/27/2019, 07/27/2020   Influenza,inj,Quad PF,6+ Mos 07/31/2021   MMR 08/23/1997   Pneumococcal Polysaccharide-23 07/27/2020   Td 01/24/1998, 10/26/2018   Tdap 07/11/2008   Typhoid Live 07/11/2008    Tetanus: 2020 Pneum: 07/2020 Flu vaccine: DUE, TODAY HPV: declines Covid 19: declines   Sleep study: 08/2018 normal  CTA head/neck: 07/2018 Mild noncalcified plaque at the right common carotid bifurcation and left vertebral origin. There is related 50% narrowing at the left vertebral origin. [place] order in system to schedule.  MRI brain: 07/2018  Colonoscopy: age 39 EGD: n/a  Eye Exam: OVERDUE, he will schedule diabetic eye first of the year with new insurance Dentist: Dr. ?, last visit 2022, goes q35m  Patient Care Team: MUnk Pinto MD as PCP - General (Internal Medicine) SGarvin Fila MD as Consulting Physician (Neurology)  Medical History:  has Hypertension; History of CVA (cerebrovascular accident); Morbid obesity  ([place]; Environmental allergies; Stenosis of right carotid artery; Type 2 diabetes mellitus with circulatory disorder ([place]; Hyperlipidemia associated with type 2 diabetes mellitus ([place]; Acid reflux; Liver lesion, right lobe; Fatty liver; Testosterone deficiency; Fatigue; Poor sleep pattern; Vitamin D deficiency; and Left shoulder tendonitis on their problem list. Surgical History:  He  has a past surgical history that includes Tonsillectomy and Plantar's wart excision (Left, 2003). Family History:  His family history includes Alzheimer's disease in his maternal grandmother and paternal grandmother; Diabetes in his maternal grandmother, mother, and sister; Emphysema in his paternal grandmother; Epilepsy in his sister; Hyperlipidemia in his father and paternal grandfather; Hypertension in his father, maternal grandmother, mother, paternal grandfather, and sister; Lymphoma in his mother. Social History:   reports  that he quit smoking about 3 years ago. His smoking use included cigarettes. He has a 7.50 pack-year smoking history. He has never used smokeless tobacco. He reports current alcohol use of about 6.0 standard drinks per week. He reports that he does not use drugs.  Review of Systems:  Review of Systems  Constitutional:  Negative for malaise/fatigue and weight loss.  HENT:  Negative for hearing loss and tinnitus.   Eyes:  Negative for blurred vision and double vision.  Respiratory:  Negative for cough, shortness of breath and wheezing.   Cardiovascular:  Negative for chest pain, palpitations, orthopnea, claudication and leg swelling.  Gastrointestinal:  Negative for abdominal pain, blood in stool, constipation, diarrhea, heartburn, melena, nausea and vomiting.  Genitourinary: Negative.   Musculoskeletal:  Negative for joint pain and myalgias.  Skin:  Negative for rash.  Neurological:  Negative for dizziness, tingling, sensory change, weakness and headaches.  Endo/Heme/Allergies:  Negative  for polydipsia.  Psychiatric/Behavioral:  Negative for depression, memory loss and substance abuse. The patient is not nervous/anxious and does not have insomnia.   All other systems reviewed and are negative.  Physical Exam: Estimated body mass index is 43.4 kg/m as calculated from the following:   Height as of this encounter: 6' 0.25" (1.835 m).   Weight as of this encounter: 322 lb 3.2 oz (146.1 kg). BP 130/84   Pulse 95   Temp 97.7 F (36.5 C)   Ht 6' 0.25" (1.835 m)   Wt (!) 322 lb 3.2 oz (146.1 kg)   SpO2 99%   BMI 43.40 kg/m  General Appearance: Well nourished, in no apparent distress.  Eyes: PERRLA, EOMs, conjunctiva no swelling or erythema, Sinuses: No Frontal/maxillary tenderness  ENT/Mouth: Ext aud canals clear, normal light reflex with TMs without erythema, bulging. Good dentition. No erythema, swelling, or exudate on post pharynx. Tonsils not swollen or erythematous. Hearing normal.  Neck: Supple, thyroid normal. No bruits  Respiratory: Respiratory effort normal, BS equal bilaterally without rales, rhonchi, wheezing or stridor.  Cardio: RRR without murmurs, rubs or gallops. Brisk peripheral pulses without edema.  Chest: symmetric, with normal excursions and percussion.  Abdomen: Soft/obese abdomen, non-tender, no guarding, rebound, palpable hernias, masses, or organomegaly.  Lymphatics: Non tender without lymphadenopathy.  Genitourinary: Declines Musculoskeletal: Full ROM all peripheral extremities,5/5 strength, and normal gait.  Skin: Warm, dry without rashes, lesions, ecchymosis. Neuro: Cranial nerves intact, reflexes equal bilaterally. Normal muscle tone, no cerebellar symptoms. Sensation intact to monofilament.  Psych: Awake and oriented X 3, normal affect, Insight and Judgment appropriate.    EKG: NSR  Gorden Harms Bryanah Sidell 4:56 PM [facility]

## 2021-07-30 ENCOUNTER — Encounter: Payer: BC Managed Care – PPO | Admitting: Adult Health

## 2021-07-31 ENCOUNTER — Ambulatory Visit (INDEPENDENT_AMBULATORY_CARE_PROVIDER_SITE_OTHER): Payer: No Typology Code available for payment source | Admitting: Adult Health

## 2021-07-31 ENCOUNTER — Encounter: Payer: Self-pay | Admitting: Adult Health

## 2021-07-31 ENCOUNTER — Other Ambulatory Visit: Payer: Self-pay

## 2021-07-31 VITALS — BP 130/84 | HR 95 | Temp 97.7°F | Ht 72.25 in | Wt 322.2 lb

## 2021-07-31 DIAGNOSIS — K760 Fatty (change of) liver, not elsewhere classified: Secondary | ICD-10-CM

## 2021-07-31 DIAGNOSIS — E349 Endocrine disorder, unspecified: Secondary | ICD-10-CM

## 2021-07-31 DIAGNOSIS — K219 Gastro-esophageal reflux disease without esophagitis: Secondary | ICD-10-CM

## 2021-07-31 DIAGNOSIS — Z79899 Other long term (current) drug therapy: Secondary | ICD-10-CM | POA: Diagnosis not present

## 2021-07-31 DIAGNOSIS — Z8673 Personal history of transient ischemic attack (TIA), and cerebral infarction without residual deficits: Secondary | ICD-10-CM

## 2021-07-31 DIAGNOSIS — Z13220 Encounter for screening for lipoid disorders: Secondary | ICD-10-CM

## 2021-07-31 DIAGNOSIS — Z136 Encounter for screening for cardiovascular disorders: Secondary | ICD-10-CM

## 2021-07-31 DIAGNOSIS — I10 Essential (primary) hypertension: Secondary | ICD-10-CM | POA: Diagnosis not present

## 2021-07-31 DIAGNOSIS — E785 Hyperlipidemia, unspecified: Secondary | ICD-10-CM

## 2021-07-31 DIAGNOSIS — Z131 Encounter for screening for diabetes mellitus: Secondary | ICD-10-CM | POA: Diagnosis not present

## 2021-07-31 DIAGNOSIS — Z1389 Encounter for screening for other disorder: Secondary | ICD-10-CM

## 2021-07-31 DIAGNOSIS — Z3009 Encounter for other general counseling and advice on contraception: Secondary | ICD-10-CM

## 2021-07-31 DIAGNOSIS — E559 Vitamin D deficiency, unspecified: Secondary | ICD-10-CM

## 2021-07-31 DIAGNOSIS — Z9109 Other allergy status, other than to drugs and biological substances: Secondary | ICD-10-CM

## 2021-07-31 DIAGNOSIS — Z0000 Encounter for general adult medical examination without abnormal findings: Secondary | ICD-10-CM

## 2021-07-31 DIAGNOSIS — Z1329 Encounter for screening for other suspected endocrine disorder: Secondary | ICD-10-CM | POA: Diagnosis not present

## 2021-07-31 DIAGNOSIS — Z23 Encounter for immunization: Secondary | ICD-10-CM | POA: Diagnosis not present

## 2021-07-31 DIAGNOSIS — K769 Liver disease, unspecified: Secondary | ICD-10-CM

## 2021-07-31 DIAGNOSIS — Z0001 Encounter for general adult medical examination with abnormal findings: Secondary | ICD-10-CM

## 2021-07-31 DIAGNOSIS — E1169 Type 2 diabetes mellitus with other specified complication: Secondary | ICD-10-CM

## 2021-07-31 DIAGNOSIS — E1159 Type 2 diabetes mellitus with other circulatory complications: Secondary | ICD-10-CM

## 2021-07-31 DIAGNOSIS — I6521 Occlusion and stenosis of right carotid artery: Secondary | ICD-10-CM

## 2021-07-31 NOTE — Patient Instructions (Signed)
[name] , Thank you for taking time to come for your Annual Wellness Visit. I appreciate your ongoing commitment to your health goals. Please review the following plan we discussed and let me know if I can assist you in the future.   These are the goals we discussed:  Goals      Blood Pressure < 130/80     DIET - INCREASE WATER INTAKE     80-100 fluid ounces     Exercise 150 min/wk Moderate Activity     HEMOGLOBIN A1C < 7.0     LDL CALC < 70     Weight (lb) < 275 lb (124.7 kg)        This is a list of the screening recommended for you and due dates:  Health Maintenance  Topic Date Due   Eye exam for diabetics  Never done   Flu Shot  05/07/2021   COVID-19 Vaccine (1) 08/16/2021*   Hepatitis C Screening: USPSTF Recommendation to screen - Ages 18-79 yo.  07/31/2022*   Pneumococcal Vaccination (2 - PCV) 08/01/2031*   Hemoglobin A1C  09/07/2021   Complete foot exam   07/31/2022   Tetanus Vaccine  10/26/2028   HPV Vaccine  Aged Out   HIV Screening  Discontinued  *Topic was postponed. The date shown is not the original due date.     Know what a healthy weight is for you (roughly BMI <25) and aim to maintain this  Aim for 7+ servings of fruits and vegetables daily  65-80+ fluid ounces of water or unsweet tea for healthy kidneys  Limit to max 1 drink of alcohol per day; avoid smoking/tobacco  Limit animal fats in diet for cholesterol and heart health - choose grass fed whenever available  Avoid highly processed foods, and foods high in saturated/trans fats  Aim for low stress - take time to unwind and care for your mental health  Aim for 150 min of moderate intensity exercise weekly for heart health, and weights twice weekly for bone health  Aim for 7-9 hours of sleep daily    High-Fiber Eating Plan Fiber, also called dietary fiber, is a type of carbohydrate. It is found foods such as fruits, vegetables, whole grains, and beans. A high-fiber diet can have many  health benefits. Your health care provider may recommend a high-fiber diet to help: Prevent constipation. Fiber can make your bowel movements more regular. Lower your cholesterol. Relieve the following conditions: Inflammation of veins in the anus (hemorrhoids). Inflammation of specific areas of the digestive tract (uncomplicated diverticulosis). A problem of the large intestine, also called the colon, that sometimes causes pain and diarrhea (irritable bowel syndrome, or IBS). Prevent overeating as part of a weight-loss plan. Prevent heart disease, type 2 diabetes, and certain cancers. What are tips for following this plan? Reading food labels  Check the nutrition facts label on food products for the amount of dietary fiber. Choose foods that have 5 grams of fiber or more per serving. The goals for recommended daily fiber intake include: Men (age 50 or younger): 34-38 g. Men (over age 26): 28-34 g. Women (age 24 or younger): 25-28 g. Women (over age 99): 22-25 g. Your daily fiber goal is _____________ g. Shopping Choose whole fruits and vegetables instead of processed forms, such as apple juice or applesauce. Choose a wide variety of high-fiber foods such as avocados, lentils, oats, and kidney beans. Read the nutrition facts label of the foods you choose. Be aware of foods  with added fiber. These foods often have high sugar and sodium amounts per serving. Cooking Use whole-grain flour for baking and cooking. Cook with brown rice instead of white rice. Meal planning Start the day with a breakfast that is high in fiber, such as a cereal that contains 5 g of fiber or more per serving. Eat breads and cereals that are made with whole-grain flour instead of refined flour or white flour. Eat brown rice, bulgur wheat, or millet instead of white rice. Use beans in place of meat in soups, salads, and pasta dishes. Be sure that half of the grains you eat each day are whole grains. General  information You can get the recommended daily intake of dietary fiber by: Eating a variety of fruits, vegetables, grains, nuts, and beans. Taking a fiber supplement if you are not able to take in enough fiber in your diet. It is better to get fiber through food than from a supplement. Gradually increase how much fiber you consume. If you increase your intake of dietary fiber too quickly, you may have bloating, cramping, or gas. Drink plenty of water to help you digest fiber. Choose high-fiber snacks, such as berries, raw vegetables, nuts, and popcorn. What foods should I eat? Fruits Berries. Pears. Apples. Oranges. Avocado. Prunes and raisins. Dried figs. Vegetables Sweet potatoes. Spinach. Kale. Artichokes. Cabbage. Broccoli. Cauliflower. Green peas. Carrots. Squash. Grains Whole-grain breads. Multigrain cereal. Oats and oatmeal. Brown rice. Barley. Bulgur wheat. Millet. Quinoa. Bran muffins. Popcorn. Rye wafer crackers. Meats and other proteins Navy beans, kidney beans, and pinto beans. Soybeans. Split peas. Lentils. Nuts and seeds. Dairy Fiber-fortified yogurt. Beverages Fiber-fortified soy milk. Fiber-fortified orange juice. Other foods Fiber bars. The items listed above may not be a complete list of recommended foods and beverages. Contact a dietitian for more information. What foods should I avoid? Fruits Fruit juice. Cooked, strained fruit. Vegetables Fried potatoes. Canned vegetables. Well-cooked vegetables. Grains White bread. Pasta made with refined flour. White rice. Meats and other proteins Fatty cuts of meat. Fried chicken or fried fish. Dairy Milk. Yogurt. Cream cheese. Sour cream. Fats and oils Butters. Beverages Soft drinks. Other foods Cakes and pastries. The items listed above may not be a complete list of foods and beverages to avoid. Talk with your dietitian about what choices are best for you. Summary Fiber is a type of carbohydrate. It is found in foods  such as fruits, vegetables, whole grains, and beans. A high-fiber diet has many benefits. It can help to prevent constipation, lower blood cholesterol, aid weight loss, and reduce your risk of heart disease, diabetes, and certain cancers. Increase your intake of fiber gradually. Increasing fiber too quickly may cause cramping, bloating, and gas. Drink plenty of water while you increase the amount of fiber you consume. The best sources of fiber include whole fruits and vegetables, whole grains, nuts, seeds, and beans. This information is not intended to replace advice given to you by your health care provider. Make sure you discuss any questions you have with your health care provider. Document Revised: 01/27/2020 Document Reviewed: 01/27/2020 Elsevier Patient Education  2022 [employer].

## 2021-08-01 LAB — URINALYSIS, ROUTINE W REFLEX MICROSCOPIC
Bilirubin Urine: NEGATIVE
Hgb urine dipstick: NEGATIVE
Leukocytes,Ua: NEGATIVE
Nitrite: NEGATIVE
Protein, ur: NEGATIVE
Specific Gravity, Urine: 1.03 (ref 1.001–1.035)
pH: 7 (ref 5.0–8.0)

## 2021-08-01 LAB — CBC WITH DIFFERENTIAL/PLATELET
Absolute Monocytes: 602 cells/uL (ref 200–950)
Basophils Absolute: 47 cells/uL (ref 0–200)
Basophils Relative: 0.5 %
Eosinophils Absolute: 141 cells/uL (ref 15–500)
Eosinophils Relative: 1.5 %
HCT: 43.3 % (ref 38.5–50.0)
Hemoglobin: 14.6 g/dL (ref 13.2–17.1)
Lymphs Abs: 2331 cells/uL (ref 850–3900)
MCH: 29 pg (ref 27.0–33.0)
MCHC: 33.7 g/dL (ref 32.0–36.0)
MCV: 85.9 fL (ref 80.0–100.0)
MPV: 9.4 fL (ref 7.5–12.5)
Monocytes Relative: 6.4 %
Neutro Abs: 6279 cells/uL (ref 1500–7800)
Neutrophils Relative %: 66.8 %
Platelets: 332 10*3/uL (ref 140–400)
RBC: 5.04 10*6/uL (ref 4.20–5.80)
RDW: 13.3 % (ref 11.0–15.0)
Total Lymphocyte: 24.8 %
WBC: 9.4 10*3/uL (ref 3.8–10.8)

## 2021-08-01 LAB — COMPLETE METABOLIC PANEL WITH GFR
AG Ratio: 1.9 (calc) (ref 1.0–2.5)
ALT: 28 U/L (ref 9–46)
AST: 15 U/L (ref 10–40)
Albumin: 5 g/dL (ref 3.6–5.1)
Alkaline phosphatase (APISO): 44 U/L (ref 36–130)
BUN: 22 mg/dL (ref 7–25)
CO2: 22 mmol/L (ref 20–32)
Calcium: 10.3 mg/dL (ref 8.6–10.3)
Chloride: 101 mmol/L (ref 98–110)
Creat: 0.93 mg/dL (ref 0.60–1.26)
Globulin: 2.7 g/dL (calc) (ref 1.9–3.7)
Glucose, Bld: 115 mg/dL — ABNORMAL HIGH (ref 65–99)
Potassium: 4.5 mmol/L (ref 3.5–5.3)
Sodium: 138 mmol/L (ref 135–146)
Total Bilirubin: 1.3 mg/dL — ABNORMAL HIGH (ref 0.2–1.2)
Total Protein: 7.7 g/dL (ref 6.1–8.1)
eGFR: 109 mL/min/{1.73_m2} (ref >=60)

## 2021-08-01 LAB — TESTOSTERONE: Testosterone: 159 ng/dL — ABNORMAL LOW (ref 250–827)

## 2021-08-01 LAB — MICROALBUMIN / CREATININE URINE RATIO
Creatinine, Urine: 55 mg/dL (ref 20–320)
Microalb Creat Ratio: 7 mcg/mg creat (ref <30)
Microalb, Ur: 0.4 mg/dL

## 2021-08-01 LAB — LIPID PANEL
Cholesterol: 162 mg/dL (ref <200)
HDL: 37 mg/dL — ABNORMAL LOW (ref >=40)
LDL Cholesterol (Calc): 89 mg/dL (calc)
Non-HDL Cholesterol (Calc): 125 mg/dL (calc) (ref <130)
Total CHOL/HDL Ratio: 4.4 (calc) (ref <5.0)
Triglycerides: 270 mg/dL — ABNORMAL HIGH (ref <150)

## 2021-08-01 LAB — VITAMIN D 25 HYDROXY (VIT D DEFICIENCY, FRACTURES): Vit D, 25-Hydroxy: 58 ng/mL (ref 30–100)

## 2021-08-01 LAB — TSH: TSH: 1.61 mIU/L (ref 0.40–4.50)

## 2021-08-01 LAB — HEMOGLOBIN A1C
Hgb A1c MFr Bld: 7.4 % of total Hgb — ABNORMAL HIGH (ref <5.7)
Mean Plasma Glucose: 166 mg/dL
eAG (mmol/L): 9.2 mmol/L

## 2021-08-01 LAB — MAGNESIUM: Magnesium: 2.2 mg/dL (ref 1.5–2.5)

## 2021-09-04 ENCOUNTER — Ambulatory Visit: Payer: No Typology Code available for payment source

## 2021-09-04 ENCOUNTER — Ambulatory Visit: Payer: No Typology Code available for payment source | Admitting: Adult Health

## 2021-10-04 ENCOUNTER — Ambulatory Visit: Payer: No Typology Code available for payment source

## 2021-10-19 ENCOUNTER — Other Ambulatory Visit: Payer: Self-pay | Admitting: Internal Medicine

## 2021-11-02 NOTE — Progress Notes (Signed)
FOLLOW UP  Assessment and Plan:   Hypertension, unspecified type Continue medication Monitor blood pressure at home; call if consistently over 130/80 Continue DASH diet.   Reminder to go to the ER if any CP, SOB, nausea, dizziness, severe HA, changes vision/speech, left arm numbness and tingling and jaw pain. -     COMPLETE METABOLIC PANEL WITH GFR -     Magnesium  Type 2 diabetes mellitus with other specified complication, without long-term current use of insulin (HCC) Education: Reviewed ABCs of diabetes management (respective goals in parentheses):  A1C (<7), blood pressure (<130/80), and cholesterol (LDL <70) Eye Exam yearly and Dental Exam every 6 months. Dietary recommendations Physical Activity recommendations Continue metformin and jardiance Didn't tolerate ozempic, Bcise He is interested in trying mounjaro or rebelsys, would like to try mounjaro first per recommendation by family member SE/mechamism discussed, sent in starting dose x 1 month, follow up with progress.  strongly motivated to work on lifestyle and we are willing to work with this as long as we continue to see progress with lifestyle modification and weight loss going forward NEEDS TO CHECK FASTING - minimum weekly/week, keep a log to bring each visit -     Hemoglobin A1c   History of CVA (cerebrovascular accident) Avoid vasocontrictive meds; continue ASA;  Control blood pressure, cholesterol, glucose, increase exercise.  -     Lipid panel  Morbid obesity (HCC) Long discussion about weight loss, diet, and exercise Discussed appropriate weight for height and initial goal (<300lb) - he is confident he can achieve this in the next 3-6 months Intolerant of phentermine, topamax, GLP-1i;  Work on improving stress/sleep patterns Discussed portions, mindful eating; come up with plan to avoid fast food/junk food snacks, plan ahead, have good options easily accessible Goal to make small sustainable changes Follow  up at next visit  Hyperlipidemia associated with type 2 diabetes mellitus (HCC) Continue medications: rosuvastatin 40 mg daily LDL goal <70, working on lifestyle rather than add zetia per his preference Continue low cholesterol diet and exercise.  Check lipid panel.  -     Lipid panel -     TSH  Vitamin D deficiency Continue vitamin D supplement for goal 60-100 Check vitamin D level at CPE  Fatigue B12 was borderline, get on SL supplement  Low testosterone  - on zinc, deferring supplement, working on lifestyle/ weight loss   Continue diet and meds as discussed. Further disposition pending results of labs. Discussed med's effects and SE's.   Over 30 minutes of exam, counseling, chart review, and critical decision making was performed.   Future Appointments  Date Time Provider Department Center  11/06/2021  2:30 PM Judd Gaudierorbett, Lilianna Case, NP [facility] None  02/12/2022  3:30 PM Judd Gaudierorbett, Giabella Duhart, NP [facility] None  05/21/2022  2:30 PM Revonda HumphreyMull, Dana W, NP [facility] None  09/05/2022  2:00 PM Revonda HumphreyMull, Dana W, NP [facility] None    ----------------------------------------------------------------------------------------------------------------------  HPI 36 y.o. male  presents for 3 month follow up on hypertension, cholesterol, diabetes, morbid obesity and vitamin D deficiency.   He had left shoulder pain, improved with meloxicam, no limitations currently.   He has had 2 episodes of TIA, suspected r/t vasoconstrictive meds after otherwise unremarkable workup by neurology Dr. Pearlean BrownieSethi, concluded cerebral reversible vasoconstriction syndrome and advised to avoid sudafed, phentermine.   GERD has been fairly controlled by omeprazole 40 mg daily. He had abd US 10/2020 for bilirubin elevatinon showing diffuse increased echogenicity, also focal angular lesion in the central RIGHT hepatic lobe  measuring 3.8 cm that was recommended for MRI, has with order w/wo contrast and pending, patient postponed due to  insurance/cost.   BMI is Body mass index is 43.23 kg/m., he is now working day shift. Reports getting back on diet slowly, trying to keep it simple, reducing fast food, packing lunch more. Has free weights that he will do during work day.  Intolerant topamax in the past (memory), phentermine contraindicated, several GLP-1ra Down 1 lb since last visit   Goal is <300 lb by next visit per patient Water intake: over a gallon daily, or unsweet tea, black coffee, <1 soda/month  Alcohol: has reduced, 5-6/week Gets on scale weekly  Trying to increase walking, 1 mile a few days a week Wt Readings from Last 3 Encounters:  11/06/21 (!) 321 lb (145.6 kg)  07/31/21 (!) 322 lb 3.2 oz (146.1 kg)  03/08/21 (!) 319 lb (144.7 kg)   His blood pressure has been controlled at home (125-130/80s), today their BP is BP: 138/80  He does workout. He denies chest pain, shortness of breath, dizziness.   He is on cholesterol medication Rosuvastatin 40 mg daily and denies myalgias. His cholesterol is not at goal of LDL <70. The cholesterol last visit was:   Lab Results  Component Value Date   CHOL 162 07/31/2021   HDL 37 (L) 07/31/2021   LDLCALC 89 07/31/2021   TRIG 270 (H) 07/31/2021   CHOLHDL 4.4 07/31/2021    He has been working on diet and exercise for T2DM on metformin 2000 mg daily, (SE with bydureon, ozempic, trulicity), on jardiance 25 mg working aggressively on lifestyle per patient preference, and denies increased appetite, nausea, paresthesia of the feet, polydipsia, polyuria, visual disturbances and vomiting.  He admits hasn't been checking glucose Last in the office was:  Lab Results  Component Value Date   HGBA1C 7.4 (H) 07/31/2021    Last GFR:  Lab Results  Component Value Date   GFRNONAA 97 03/08/2021   Patient is on Vitamin D supplement, taking 87564 IU daily. Lab Results  Component Value Date   VD25OH 58 07/31/2021     Hasn't started on supplement -  Lab Results  Component Value  Date   VITAMINB12 386 10/16/2020   He has a history of testosterone deficiency and is NOT on testosterone, on zinc supplement. Lab Results  Component Value Date   TESTOSTERONE 159 (L) 07/31/2021     Current Medications:  Current Outpatient Medications on File Prior to Visit  Medication Sig   aspirin 81 MG chewable tablet Chew 81 mg by mouth daily.    Cholecalciferol (VITAMIN D3) 250 MCG (10000 UT) capsule Take 10,000 Units by mouth daily.   Cyanocobalamin 500 MCG SUBL Place 1 tablet under the tongue daily.   empagliflozin (JARDIANCE) 25 MG TABS tablet Take  1 tablet  Daily  for Diabetes   fluticasone (FLONASE) 50 MCG/ACT nasal spray INSTILL TWO SPRAYS IN EACH NOSTRIL TWICE A DAY   glucose blood (FREESTYLE LITE) test strip Test sugar once daily   ketoconazole (NIZORAL) 2 % cream Apply 1 application topically 2 (two) times daily.   lisinopril (ZESTRIL) 40 MG tablet Take  1 tablet  Daily for BP                                                              /  TAKE  BY MOUTH   meloxicam (MOBIC) 15 MG tablet Take  1/2 to  1 tablet  Daily  with Food for  Pain & Inflammation & try limit to 5 days /week to avoid Kidney Damage   metFORMIN (GLUCOPHAGE) 500 MG tablet Take  2 tablets  2 x /day  with Meals  for Diabetes   omeprazole (PRILOSEC) 40 MG capsule TAKE ONE CAPSULE BY MOUTH DAILY TO PREVENT HEARTBURN AND INDIGESTION   rosuvastatin (CRESTOR) 40 MG tablet TAKE ONE TABLET BY MOUTH DAILY FOR HIGH CHOLESTEROL   zinc gluconate 50 MG tablet Take 50 mg by mouth daily.   No current facility-administered medications on file prior to visit.     Allergies:  Allergies  Allergen Reactions   Bisoprolol     Numbness, tingling, feels bad   Codeine Swelling     Medical History:  Past Medical History:  Diagnosis Date   Alcohol consumption binge drinking 07/17/2018   12-24 beers on the weekends   Diabetes mellitus without complication [facility])    Former smoker (quit 07/2018, 7.5  pack year history) 07/17/2018   History of COVID-19 11/15/2019   Hypertension    Morbid obesity (HCC)    Stroke (HCC)    "mini stroke"   Family history- Reviewed and unchanged Social history- Reviewed and unchanged   Review of Systems:  Review of Systems  Constitutional:  Negative for malaise/fatigue and weight loss.  HENT:  Negative for hearing loss and tinnitus.   Eyes:  Negative for blurred vision and double vision.  Respiratory:  Negative for cough, shortness of breath and wheezing.   Cardiovascular:  Negative for chest pain, palpitations, orthopnea, claudication and leg swelling.  Gastrointestinal:  Negative for abdominal pain, blood in stool, constipation, diarrhea, heartburn, melena, nausea and vomiting.  Genitourinary: Negative.   Musculoskeletal:  Negative for joint pain (L shoulder improved) and myalgias.  Skin:  Negative for rash.  Neurological:  Negative for dizziness, tingling, sensory change, weakness and headaches.  Endo/Heme/Allergies:  Negative for polydipsia.  Psychiatric/Behavioral: Negative.    All other systems reviewed and are negative.  Physical Exam: BP 138/80    Pulse 90    Temp (!) 97.2 F (36.2 C)    Wt (!) 321 lb (145.6 kg)    SpO2 99%    BMI 43.23 kg/m  Wt Readings from Last 3 Encounters:  11/06/21 (!) 321 lb (145.6 kg)  07/31/21 (!) 322 lb 3.2 oz (146.1 kg)  03/08/21 (!) 319 lb (144.7 kg)   General Appearance: Well nourished, well dressed, morbidly obese young male in no apparent distress. Eyes: PERRLA, EOMs, conjunctiva no swelling or erythema Sinuses: No Frontal/maxillary tenderness ENT/Mouth: Ext aud canals clear, TMs without erythema, bulging. Mask in place; oral exam deferred. Hearing normal.  Neck: Supple, thyroid normal.  Respiratory: Respiratory effort normal, BS equal bilaterally without rales, rhonchi, wheezing or stridor.  Cardio: RRR with no MRGs. Brisk peripheral pulses without edema.  Abdomen: Soft, obese abdomen, + BS.  Non tender,  no guarding, rebound, hernias, masses. Lymphatics: Non tender without lymphadenopathy.  Musculoskeletal: Full ROM, 5/5 strength, Normal gait.  Skin: Warm, dry without rashes, lesions, ecchymosis.  Neuro: Cranial nerves intact. No cerebellar symptoms.  Psych: Awake and oriented X 3, normal affect, Insight and Judgment appropriate.    Dan Maker, NP 12:00 PM [facility]

## 2021-11-06 ENCOUNTER — Encounter: Payer: Self-pay | Admitting: Adult Health

## 2021-11-06 ENCOUNTER — Ambulatory Visit (INDEPENDENT_AMBULATORY_CARE_PROVIDER_SITE_OTHER): Payer: No Typology Code available for payment source | Admitting: Adult Health

## 2021-11-06 ENCOUNTER — Other Ambulatory Visit: Payer: Self-pay

## 2021-11-06 ENCOUNTER — Telehealth: Payer: Self-pay

## 2021-11-06 VITALS — BP 138/80 | HR 90 | Temp 97.2°F | Wt 321.0 lb

## 2021-11-06 DIAGNOSIS — E1159 Type 2 diabetes mellitus with other circulatory complications: Secondary | ICD-10-CM

## 2021-11-06 DIAGNOSIS — E1169 Type 2 diabetes mellitus with other specified complication: Secondary | ICD-10-CM | POA: Diagnosis not present

## 2021-11-06 DIAGNOSIS — E785 Hyperlipidemia, unspecified: Secondary | ICD-10-CM

## 2021-11-06 DIAGNOSIS — E349 Endocrine disorder, unspecified: Secondary | ICD-10-CM

## 2021-11-06 DIAGNOSIS — Z79899 Other long term (current) drug therapy: Secondary | ICD-10-CM

## 2021-11-06 DIAGNOSIS — Z8673 Personal history of transient ischemic attack (TIA), and cerebral infarction without residual deficits: Secondary | ICD-10-CM

## 2021-11-06 DIAGNOSIS — E559 Vitamin D deficiency, unspecified: Secondary | ICD-10-CM

## 2021-11-06 DIAGNOSIS — G478 Other sleep disorders: Secondary | ICD-10-CM

## 2021-11-06 DIAGNOSIS — I10 Essential (primary) hypertension: Secondary | ICD-10-CM

## 2021-11-06 MED ORDER — MOUNJARO 2.5 MG/0.5ML ~~LOC~~ SOAJ: SUBCUTANEOUS | 0 refills | Status: DC

## 2021-11-06 NOTE — Telephone Encounter (Signed)
Prior authorization for [facility] submitted via fax. Copy in the patient's chart.

## 2021-11-07 LAB — LIPID PANEL
Cholesterol: 178 mg/dL (ref <200)
HDL: 36 mg/dL — ABNORMAL LOW (ref >=40)
LDL Cholesterol (Calc): 105 mg/dL (calc) — ABNORMAL HIGH
Non-HDL Cholesterol (Calc): 142 mg/dL (calc) — ABNORMAL HIGH (ref <130)
Total CHOL/HDL Ratio: 4.9 (calc) (ref <5.0)
Triglycerides: 261 mg/dL — ABNORMAL HIGH (ref <150)

## 2021-11-07 LAB — CBC WITH DIFFERENTIAL/PLATELET
Absolute Monocytes: 698 cells/uL (ref 200–950)
Basophils Absolute: 58 cells/uL (ref 0–200)
Basophils Relative: 0.6 %
Eosinophils Absolute: 252 cells/uL (ref 15–500)
Eosinophils Relative: 2.6 %
HCT: 45.6 % (ref 38.5–50.0)
Hemoglobin: 15.1 g/dL (ref 13.2–17.1)
Lymphs Abs: 2221 cells/uL (ref 850–3900)
MCH: 28.5 pg (ref 27.0–33.0)
MCHC: 33.1 g/dL (ref 32.0–36.0)
MCV: 86.2 fL (ref 80.0–100.0)
MPV: 9.6 fL (ref 7.5–12.5)
Monocytes Relative: 7.2 %
Neutro Abs: 6470 cells/uL (ref 1500–7800)
Neutrophils Relative %: 66.7 %
Platelets: 303 10*3/uL (ref 140–400)
RBC: 5.29 10*6/uL (ref 4.20–5.80)
RDW: 12.8 % (ref 11.0–15.0)
Total Lymphocyte: 22.9 %
WBC: 9.7 10*3/uL (ref 3.8–10.8)

## 2021-11-07 LAB — COMPLETE METABOLIC PANEL WITH GFR
AG Ratio: 2 (calc) (ref 1.0–2.5)
ALT: 27 U/L (ref 9–46)
AST: 15 U/L (ref 10–40)
Albumin: 5.1 g/dL (ref 3.6–5.1)
Alkaline phosphatase (APISO): 50 U/L (ref 36–130)
BUN: 21 mg/dL (ref 7–25)
CO2: 26 mmol/L (ref 20–32)
Calcium: 10.7 mg/dL — ABNORMAL HIGH (ref 8.6–10.3)
Chloride: 101 mmol/L (ref 98–110)
Creat: 1.09 mg/dL (ref 0.60–1.26)
Globulin: 2.5 g/dL (calc) (ref 1.9–3.7)
Glucose, Bld: 168 mg/dL — ABNORMAL HIGH (ref 65–99)
Potassium: 4.3 mmol/L (ref 3.5–5.3)
Sodium: 137 mmol/L (ref 135–146)
Total Bilirubin: 0.8 mg/dL (ref 0.2–1.2)
Total Protein: 7.6 g/dL (ref 6.1–8.1)
eGFR: 90 mL/min/{1.73_m2} (ref >=60)

## 2021-11-07 LAB — MAGNESIUM: Magnesium: 2 mg/dL (ref 1.5–2.5)

## 2021-11-07 LAB — HEMOGLOBIN A1C
Hgb A1c MFr Bld: 8.7 % of total Hgb — ABNORMAL HIGH (ref <5.7)
Mean Plasma Glucose: 203 mg/dL
eAG (mmol/L): 11.2 mmol/L

## 2021-11-07 LAB — TSH: TSH: 1.29 mIU/L (ref 0.40–4.50)

## 2021-11-12 ENCOUNTER — Other Ambulatory Visit: Payer: Self-pay

## 2021-11-12 DIAGNOSIS — E1169 Type 2 diabetes mellitus with other specified complication: Secondary | ICD-10-CM

## 2021-11-12 DIAGNOSIS — E1159 Type 2 diabetes mellitus with other circulatory complications: Secondary | ICD-10-CM

## 2021-11-12 MED ORDER — MOUNJARO 2.5 MG/0.5ML ~~LOC~~ SOAJ: SUBCUTANEOUS | 0 refills | Status: DC

## 2021-11-12 MED ORDER — ROSUVASTATIN CALCIUM 40 MG PO TABS: ORAL_TABLET | ORAL | 0 refills | Status: DC

## 2021-11-30 ENCOUNTER — Encounter: Payer: Self-pay | Admitting: Adult Health

## 2021-11-30 ENCOUNTER — Other Ambulatory Visit: Payer: Self-pay | Admitting: Adult Health

## 2021-11-30 DIAGNOSIS — E1159 Type 2 diabetes mellitus with other circulatory complications: Secondary | ICD-10-CM

## 2021-11-30 MED ORDER — MOUNJARO 5 MG/0.5ML ~~LOC~~ SOAJ: 5.0000 mg | SUBCUTANEOUS | 0 refills | Status: DC

## 2021-12-01 IMAGING — US US ABDOMEN COMPLETE
1 series · 13 of 25 positions shown · non-contrast
Comparison: None.

CLINICAL DATA: Elevated bilirubin

EXAM:
ABDOMEN ULTRASOUND COMPLETE

[Series 1: us abdomen complete · 0.33mm/px · 165 acquisitions, 13 frames shown]
[im 1/165]
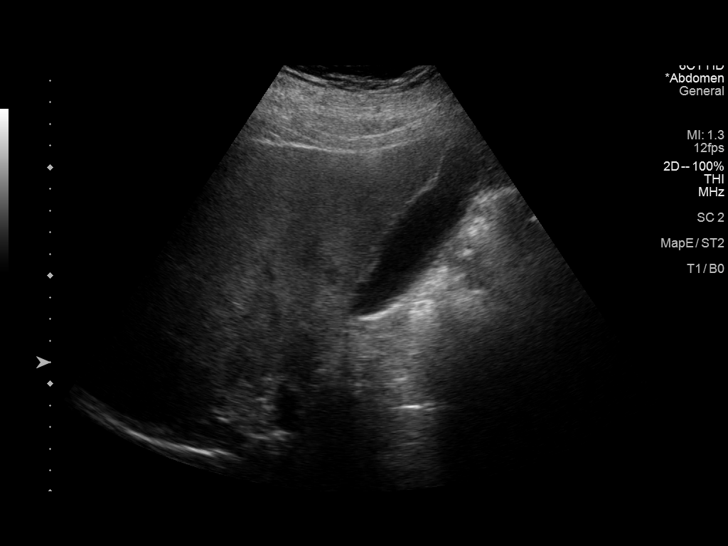
[im 14/165]
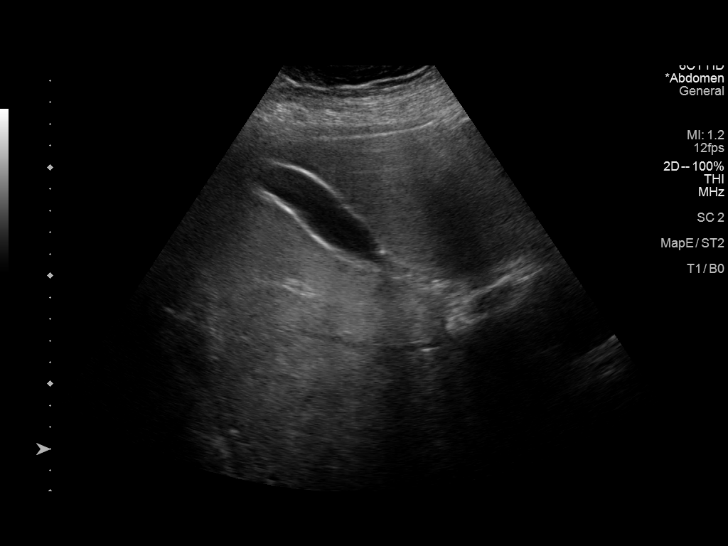
[im 28/165]
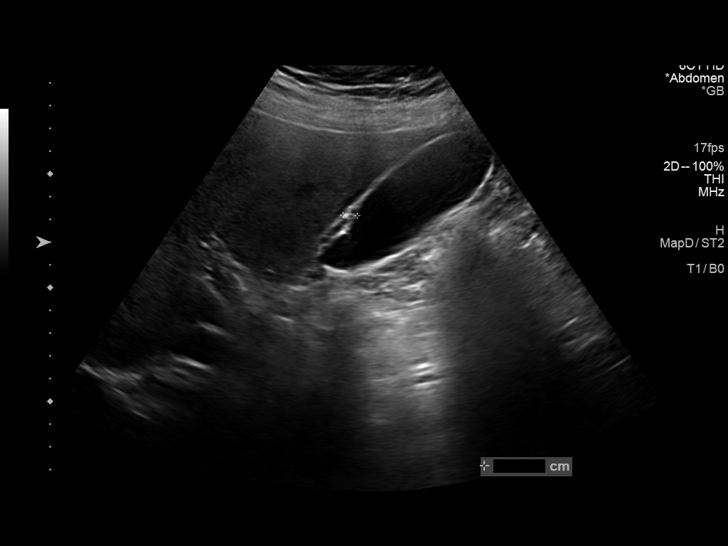
[im 42/165]
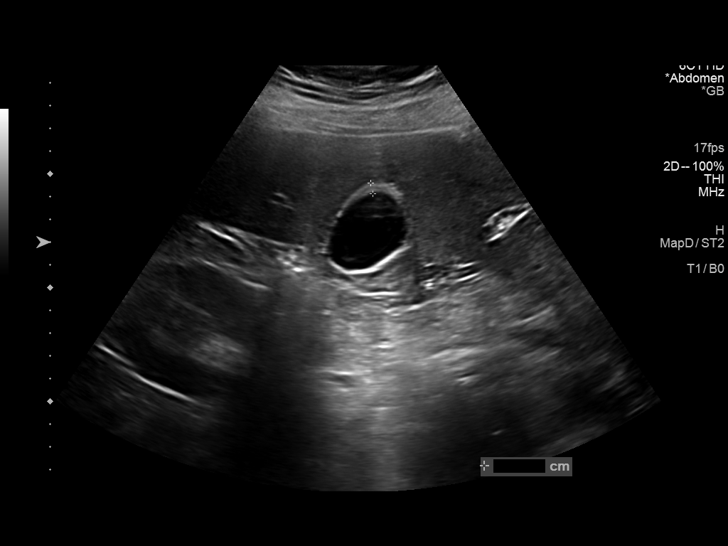
[im 55/165]
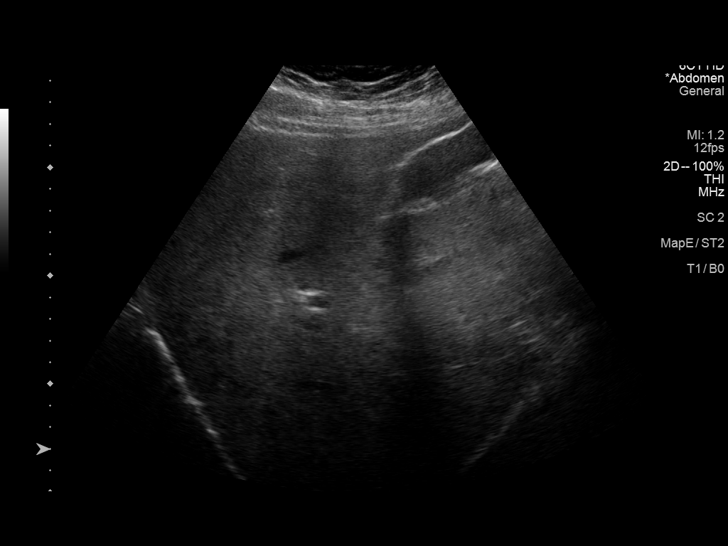
[im 69/165]
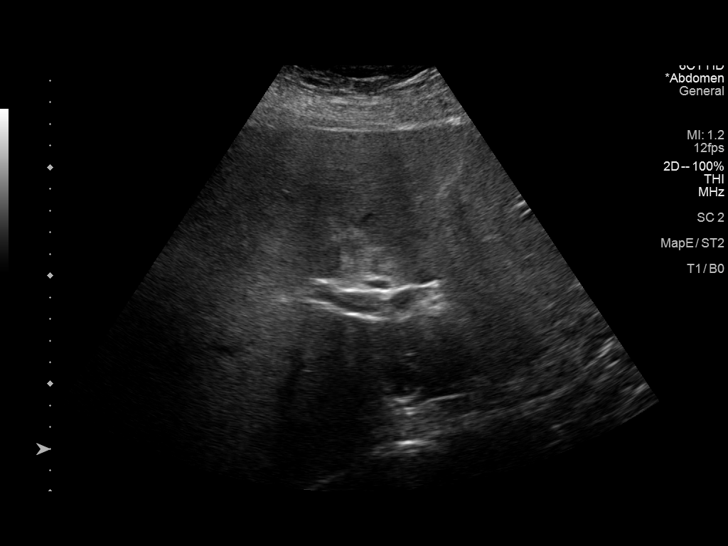
[im 83/165]
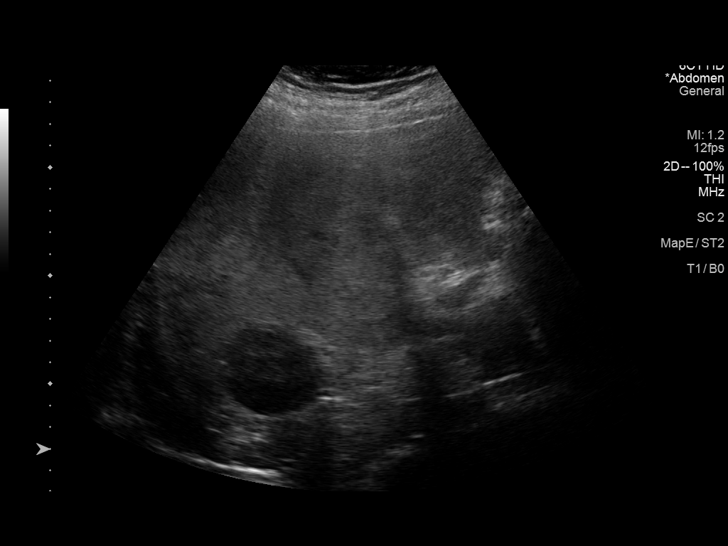
[im 96/165]
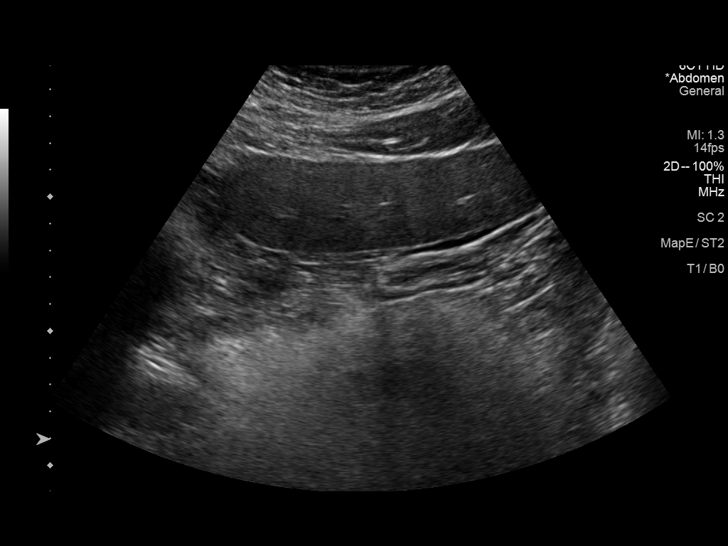
[im 110/165]
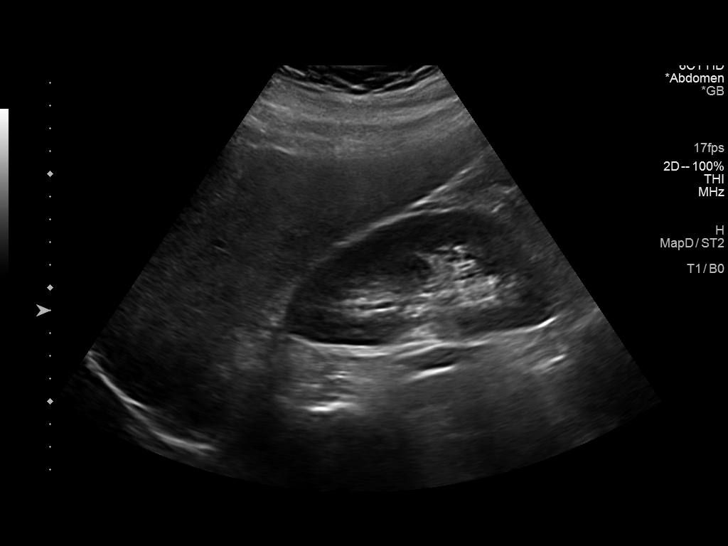
[im 124/165]
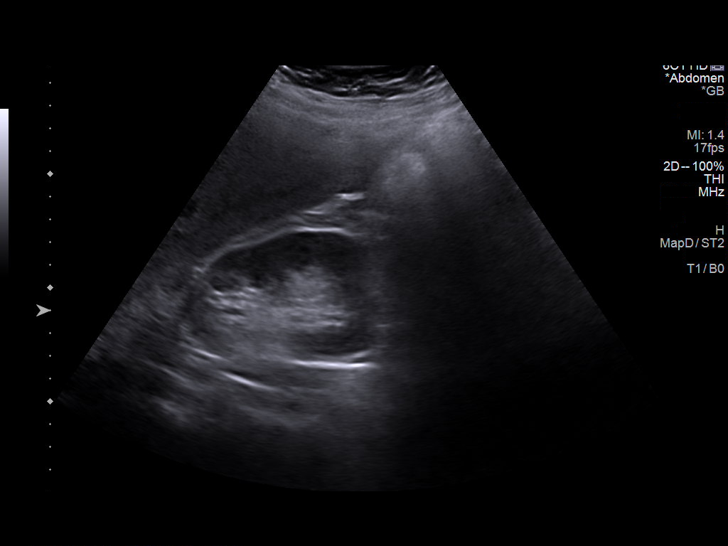
[im 137/165]
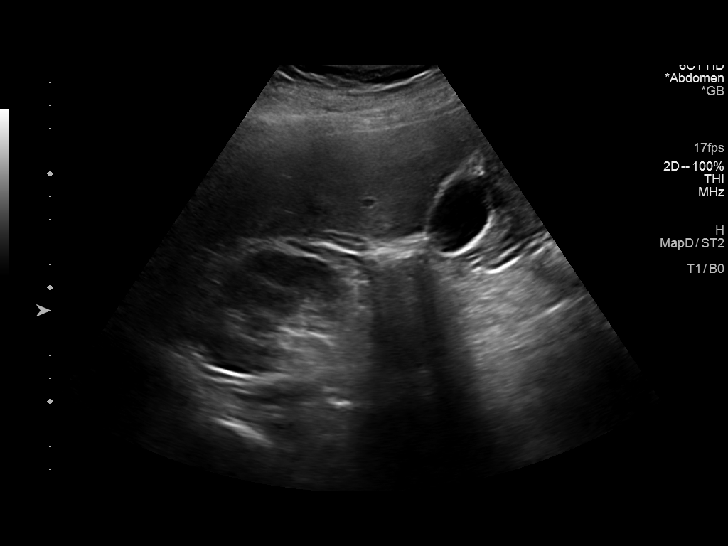
[im 151/165]
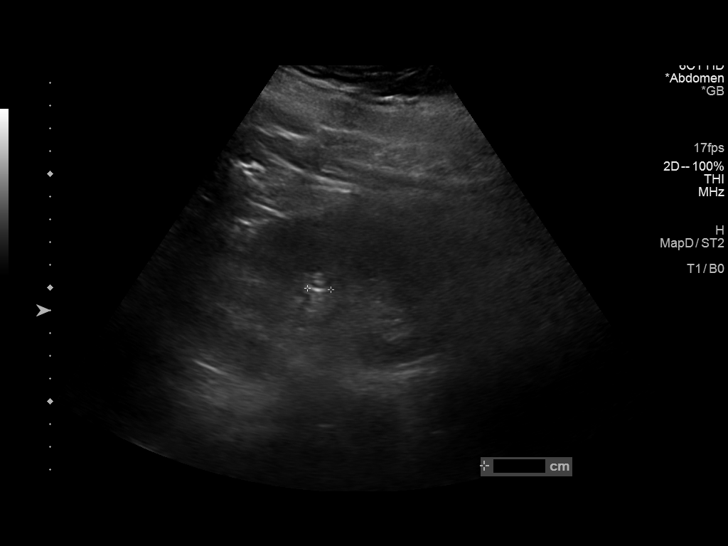
[im 165/165]
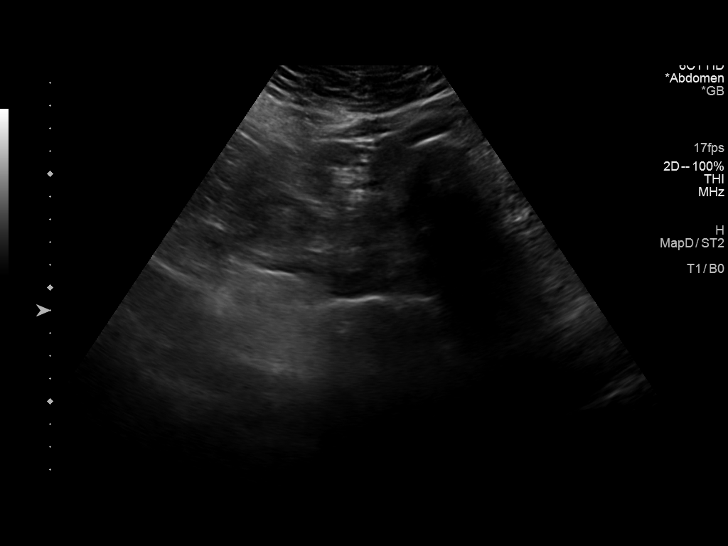

[13 of 25 positions shown; findings below may reference images not displayed]

FINDINGS: Exam is limited due to patient body habitus

Gallbladder: No gallstones or wall thickening visualized. No
sonographic Murphy sign noted by sonographer.

Common bile duct: Diameter: Gallbladder wall is mildly thickened to
5 mm. No pericholecystic fluid. Negative sonographic Murphy's sign.

Common bile duct upper limits of normal at 6 mm.

Liver: Uniform increase in hepatic echogenicity. Focal angular
lesion in the central RIGHT hepatic lobe measuring 3.8 cm. Portal
vein is patent on color Doppler imaging with normal direction of
blood flow towards the liver.

IVC: No abnormality visualized.

Pancreas: Visualized portion unremarkable.

Spleen: Size and appearance within normal limits.

Right Kidney: Length: 12.3 cm. Echogenicity within normal limits. No
mass or hydronephrosis visualized.

Left Kidney: Length: 12.9 cm. Echogenicity within normal limits. No
mass or hydronephrosis visualized.

Abdominal aorta: No aneurysm visualized.

Other findings: None.
IMPRESSION: 1. Echogenic region within the RIGHT hepatic lobe adjacent to the
gallbladder fossa. This may represent focal fatty infiltration.
Recommend non emergent MRI of the liver with and without contrast.
2. Uniform increase in hepatic echogenicity commonly represents
hepatic steatosis.
3. Common bile duct bile duct upper limits of normal.
4. Mild gallbladder wall thickening without evidence acute
cholecystitis.

## 2021-12-18 ENCOUNTER — Other Ambulatory Visit: Payer: Self-pay | Admitting: Internal Medicine

## 2021-12-18 DIAGNOSIS — E1159 Type 2 diabetes mellitus with other circulatory complications: Secondary | ICD-10-CM

## 2022-01-01 ENCOUNTER — Encounter: Payer: Self-pay | Admitting: Adult Health

## 2022-01-01 ENCOUNTER — Other Ambulatory Visit: Payer: Self-pay | Admitting: Adult Health

## 2022-01-01 DIAGNOSIS — E1159 Type 2 diabetes mellitus with other circulatory complications: Secondary | ICD-10-CM

## 2022-01-01 MED ORDER — MOUNJARO 7.5 MG/0.5ML ~~LOC~~ SOAJ: 7.5000 mg | SUBCUTANEOUS | 0 refills | Status: DC

## 2022-01-05 ENCOUNTER — Encounter: Payer: Self-pay | Admitting: Adult Health

## 2022-01-05 ENCOUNTER — Other Ambulatory Visit: Payer: Self-pay | Admitting: Internal Medicine

## 2022-01-05 DIAGNOSIS — E1159 Type 2 diabetes mellitus with other circulatory complications: Secondary | ICD-10-CM

## 2022-01-05 MED ORDER — TIRZEPATIDE 10 MG/0.5ML ~~LOC~~ SOAJ: 10.0000 mg | SUBCUTANEOUS | 3 refills | Status: DC

## 2022-01-29 ENCOUNTER — Encounter: Payer: Self-pay | Admitting: Adult Health

## 2022-02-12 ENCOUNTER — Ambulatory Visit: Payer: No Typology Code available for payment source | Admitting: Nurse Practitioner

## 2022-02-13 ENCOUNTER — Other Ambulatory Visit: Payer: Self-pay | Admitting: Adult Health

## 2022-02-13 DIAGNOSIS — E785 Hyperlipidemia, unspecified: Secondary | ICD-10-CM

## 2022-02-14 ENCOUNTER — Encounter: Payer: Self-pay | Admitting: Nurse Practitioner

## 2022-02-14 ENCOUNTER — Ambulatory Visit: Payer: No Typology Code available for payment source | Admitting: Nurse Practitioner

## 2022-02-14 VITALS — BP 120/70 | HR 102 | Temp 97.5°F | Wt 292.0 lb

## 2022-02-14 DIAGNOSIS — E785 Hyperlipidemia, unspecified: Secondary | ICD-10-CM

## 2022-02-14 DIAGNOSIS — E1159 Type 2 diabetes mellitus with other circulatory complications: Secondary | ICD-10-CM | POA: Diagnosis not present

## 2022-02-14 DIAGNOSIS — E1169 Type 2 diabetes mellitus with other specified complication: Secondary | ICD-10-CM

## 2022-02-14 DIAGNOSIS — I10 Essential (primary) hypertension: Secondary | ICD-10-CM

## 2022-02-14 DIAGNOSIS — R5383 Other fatigue: Secondary | ICD-10-CM

## 2022-02-14 NOTE — Progress Notes (Signed)
?FOLLOW UP ? ?Assessment and Plan:  ? ?1. Type 2 diabetes mellitus with other circulatory complication, without long-term current use of insulin (HCC) ?Controlled ?Continue current medications. ? ?- Hemoglobin A1c ? ?2. Morbid obesity (HCC) ?Trending down. ?Continue medications. ?Continue lifestyle modifications.  ? ?- CBC with Differential/Platelet ?- COMPLETE METABOLIC PANEL WITH GFR ?- Lipid panel ?- Hemoglobin A1c ? ?3. Hypertension, unspecified type ?Controlled. ?Continue medications. ?Continue lifestyle modifications.  ? ?- CBC with Differential/Platelet ? ?4. Hyperlipidemia associated with type 2 diabetes mellitus (HCC) ?Trending down. ?Continue medications. ?Continue lifestyle modifications.  ? ?- Lipid panel ? ?5. Other fatigue ?Requesting to stay on current dosage of Monjaro. ?Will continue to monitor. ? ?- COMPLETE METABOLIC PANEL WITH GFR ? ? ?Continue diet and meds as discussed. Further disposition pending results of labs. Discussed med's effects and SE's.   ?Over 30 minutes of exam, counseling, chart review, and critical decision making was performed.  ? ?Future Appointments  ?Date Time Provider Department Center  ?05/21/2022  2:30 PM Mull, Loma Sousaana W, NP [facility] None  ?09/05/2022  2:00 PM Mull, Loma Sousaana W, NP [facility] None  ? ? ?---------------------------------------------------------------------------------------------------------------------- ? ?HPI ?36 y.o. male  presents for 3 month follow up on hypertension, cholesterol, diabetes, weight and vitamin D deficiency.  ? ?He is continuing Mounjaro successfully for weight loss and A1C control.  He has lost 30 lb.  However, he has noticed since being on the  medication he has lost much of his drive to do things, including working and having sex. State he could "care less" whether he goes to work or has sex. He continues to engage in work and sex, however, not driven. Reports this is a huge change for him.  He is able to have sex and have an erection.  He  has completed a sleep study for OSA and reports negative.   ? ?Denies any new medication changes.  ? ?He is a former smoker quit 2019. ? ?He denies any extreme mood swings, HI/SI, change in life stressors.   ? ?BMI is Body mass index is 39.33 kg/m?., he has been working on diet and exercise. ?Wt Readings from Last 3 Encounters:  ?02/14/22 292 lb (132.5 kg)  ?11/06/21 (!) 321 lb (145.6 kg)  ?07/31/21 (!) 322 lb 3.2 oz (146.1 kg)  ? ? ?His blood pressure has been controlled at home, today their BP is BP: 120/70 ? He does workout. He denies chest pain, shortness of breath, dizziness. ? ? He is on cholesterol medication Rosuvastatin and denies myalgias. His cholesterol is trending towards goal. The cholesterol last visit was:   ?Lab Results  ?Component Value Date  ? CHOL 178 11/06/2021  ? HDL 36 (L) 11/06/2021  ? LDLCALC 105 (H) 11/06/2021  ? TRIG 261 (H) 11/06/2021  ? CHOLHDL 4.9 11/06/2021  ? ? He has been working on diet and exercise for prediabetes, and denies hyperglycemia, hypoglycemia , paresthesia of the feet, polydipsia, and polyuria. Last A1C in the office was:  ?Lab Results  ?Component Value Date  ? HGBA1C 8.7 (H) 11/06/2021  ? ?Patient is on Vitamin D supplement.   ?Lab Results  ?Component Value Date  ? VD25OH 58 07/31/2021  ?   ? ? ? ?Current Medications:  ?Current Outpatient Medications on File Prior to Visit  ?Medication Sig  ? Cyanocobalamin 500 MCG SUBL Place 1 tablet under the tongue daily.  ? empagliflozin (JARDIANCE) 25 MG TABS tablet Take  1 tablet  Daily  for Diabetes  ? fluticasone (FLONASE)  50 MCG/ACT nasal spray INSTILL TWO SPRAYS IN EACH NOSTRIL TWICE A DAY  ? glucose blood (FREESTYLE LITE) test strip Test sugar once daily  ? lisinopril (ZESTRIL) 40 MG tablet Take  1 tablet  Daily for BP                                                              /                      TAKE  BY MOUTH  ? metFORMIN (GLUCOPHAGE) 500 MG tablet TAKE TWO TABLETS BY MOUTH TWICE A DAY WITH MEALS FOR DIABETES  ?  omeprazole (PRILOSEC) 40 MG capsule TAKE ONE CAPSULE BY MOUTH DAILY TO PREVENT HEARTBURN AND INDIGESTION  ? rosuvastatin (CRESTOR) 40 MG tablet TAKE ONE TABLET BY MOUTH DAILY FOR HIGH CHOLESTEROL  ? tirzepatide (MOUNJARO) 10 MG/0.5ML Pen Inject 10 mg into the skin once a week.  ? zinc gluconate 50 MG tablet Take 50 mg by mouth daily.  ? aspirin 81 MG chewable tablet Chew 81 mg by mouth daily.  (Patient not taking: Reported on 02/14/2022)  ? Cholecalciferol (VITAMIN D3) 250 MCG (10000 UT) capsule Take 10,000 Units by mouth daily. (Patient not taking: Reported on 02/14/2022)  ? ketoconazole (NIZORAL) 2 % cream Apply 1 application topically 2 (two) times daily. (Patient not taking: Reported on 02/14/2022)  ? meloxicam (MOBIC) 15 MG tablet Take  1/2 to  1 tablet  Daily  with Food for  Pain & Inflammation & try limit to 5 days /week to avoid Kidney Damage (Patient not taking: Reported on 02/14/2022)  ? ?No current facility-administered medications on file prior to visit.  ? ? ? ?Allergies:  ?Allergies  ?Allergen Reactions  ? Bisoprolol   ?  Numbness, tingling, feels bad  ? Codeine Swelling  ?  ? ?Medical History:  ?Past Medical History:  ?Diagnosis Date  ? Alcohol consumption binge drinking 07/17/2018  ? 12-24 beers on the weekends  ? Diabetes mellitus without complication (HCC)   ? Former smoker (quit 07/2018, 7.5 pack year history) 07/17/2018  ? History of COVID-19 11/15/2019  ? Hypertension   ? Morbid obesity (HCC)   ? Stroke [facility])   ? "mini stroke"  ? ?Family history- Reviewed and unchanged ?Social history- Reviewed and unchanged ? ? ?Review of Systems:  ?ROS ? ? ? ?Physical Exam: ?BP 120/70   Pulse (!) 102   Temp (!) 97.5 ?F (36.4 ?C)   Wt 292 lb (132.5 kg)   SpO2 99%   BMI 39.33 kg/m?  ?Wt Readings from Last 3 Encounters:  ?02/14/22 292 lb (132.5 kg)  ?11/06/21 (!) 321 lb (145.6 kg)  ?07/31/21 (!) 322 lb 3.2 oz (146.1 kg)  ? ?General Appearance: Well nourished, in no apparent distress. ?Eyes: PERRLA, EOMs,  conjunctiva no swelling or erythema ?Sinuses: No Frontal/maxillary tenderness ?ENT/Mouth: Ext aud canals clear, TMs without erythema, bulging. No erythema, swelling, or exudate on post pharynx.  Tonsils not swollen or erythematous. Hearing normal.  ?Neck: Supple, thyroid normal.  ?Respiratory: Respiratory effort normal, BS equal bilaterally without rales, rhonchi, wheezing or stridor.  ?Cardio: RRR with no MRGs. Brisk peripheral pulses without edema.  ?Abdomen: Soft, + BS.  Non tender, no guarding, rebound, hernias, masses. ?Lymphatics: Non tender without  lymphadenopathy.  ?Musculoskeletal: Full ROM, 5/5 strength, Normal gait ?Skin: Warm, dry without rashes, lesions, ecchymosis.  ?Neuro: Cranial nerves intact. No cerebellar symptoms.  ?Psych: Awake and oriented X 3, normal affect, Insight and Judgment appropriate.  ? ? ?Adela Glimpse, NP ?3:32 PM ?[facility] ? ?

## 2022-02-15 LAB — CBC WITH DIFFERENTIAL/PLATELET
Absolute Monocytes: 689 cells/uL (ref 200–950)
Basophils Absolute: 41 cells/uL (ref 0–200)
Basophils Relative: 0.5 %
Eosinophils Absolute: 219 cells/uL (ref 15–500)
Eosinophils Relative: 2.7 %
HCT: 42 % (ref 38.5–50.0)
Hemoglobin: 13.8 g/dL (ref 13.2–17.1)
Lymphs Abs: 2495 cells/uL (ref 850–3900)
MCH: 28.6 pg (ref 27.0–33.0)
MCHC: 32.9 g/dL (ref 32.0–36.0)
MCV: 87.1 fL (ref 80.0–100.0)
MPV: 9.4 fL (ref 7.5–12.5)
Monocytes Relative: 8.5 %
Neutro Abs: 4658 cells/uL (ref 1500–7800)
Neutrophils Relative %: 57.5 %
Platelets: 310 10*3/uL (ref 140–400)
RBC: 4.82 10*6/uL (ref 4.20–5.80)
RDW: 13 % (ref 11.0–15.0)
Total Lymphocyte: 30.8 %
WBC: 8.1 10*3/uL (ref 3.8–10.8)

## 2022-02-15 LAB — COMPLETE METABOLIC PANEL WITH GFR
AG Ratio: 1.9 (calc) (ref 1.0–2.5)
ALT: 31 U/L (ref 9–46)
AST: 16 U/L (ref 10–40)
Albumin: 4.8 g/dL (ref 3.6–5.1)
Alkaline phosphatase (APISO): 46 U/L (ref 36–130)
BUN: 13 mg/dL (ref 7–25)
CO2: 24 mmol/L (ref 20–32)
Calcium: 9.9 mg/dL (ref 8.6–10.3)
Chloride: 105 mmol/L (ref 98–110)
Creat: 0.95 mg/dL (ref 0.60–1.26)
Globulin: 2.5 g/dL (calc) (ref 1.9–3.7)
Glucose, Bld: 123 mg/dL — ABNORMAL HIGH (ref 65–99)
Potassium: 4.2 mmol/L (ref 3.5–5.3)
Sodium: 139 mmol/L (ref 135–146)
Total Bilirubin: 1.1 mg/dL (ref 0.2–1.2)
Total Protein: 7.3 g/dL (ref 6.1–8.1)
eGFR: 106 mL/min/{1.73_m2} (ref >=60)

## 2022-02-15 LAB — LIPID PANEL
Cholesterol: 79 mg/dL (ref <200)
HDL: 30 mg/dL — ABNORMAL LOW (ref >=40)
LDL Cholesterol (Calc): 30 mg/dL (calc)
Non-HDL Cholesterol (Calc): 49 mg/dL (calc) (ref <130)
Total CHOL/HDL Ratio: 2.6 (calc) (ref <5.0)
Triglycerides: 111 mg/dL (ref <150)

## 2022-02-15 LAB — HEMOGLOBIN A1C
Hgb A1c MFr Bld: 6.1 % of total Hgb — ABNORMAL HIGH (ref <5.7)
Mean Plasma Glucose: 128 mg/dL
eAG (mmol/L): 7.1 mmol/L

## 2022-02-27 IMAGING — CR DG CHEST 2V
2 series · 2 of 2 positions shown · non-contrast
Comparison: None.

CLINICAL DATA: Cough, fever, and fatigue.

EXAM:
CHEST - 2 VIEW

[w chest pa]
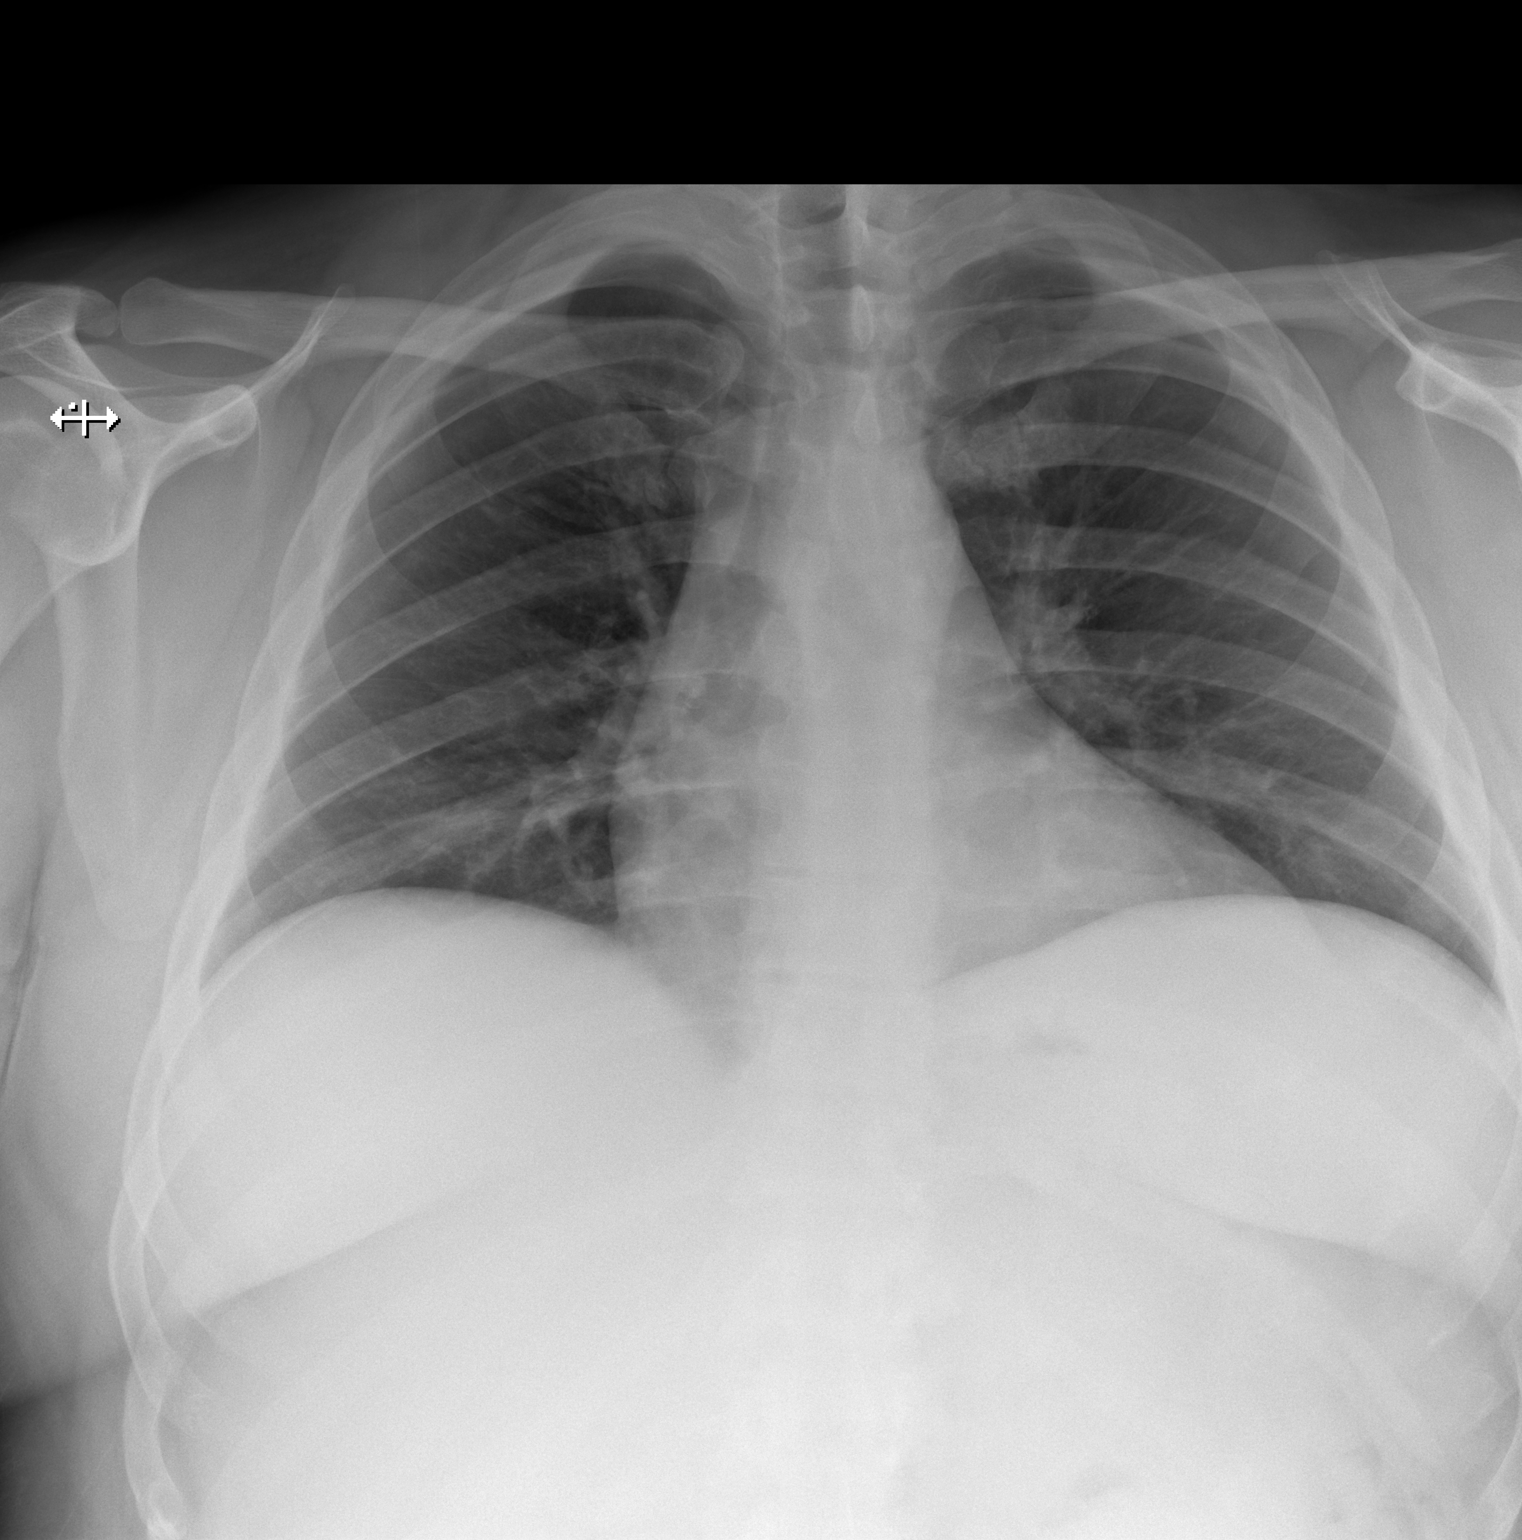

[w chest lat]
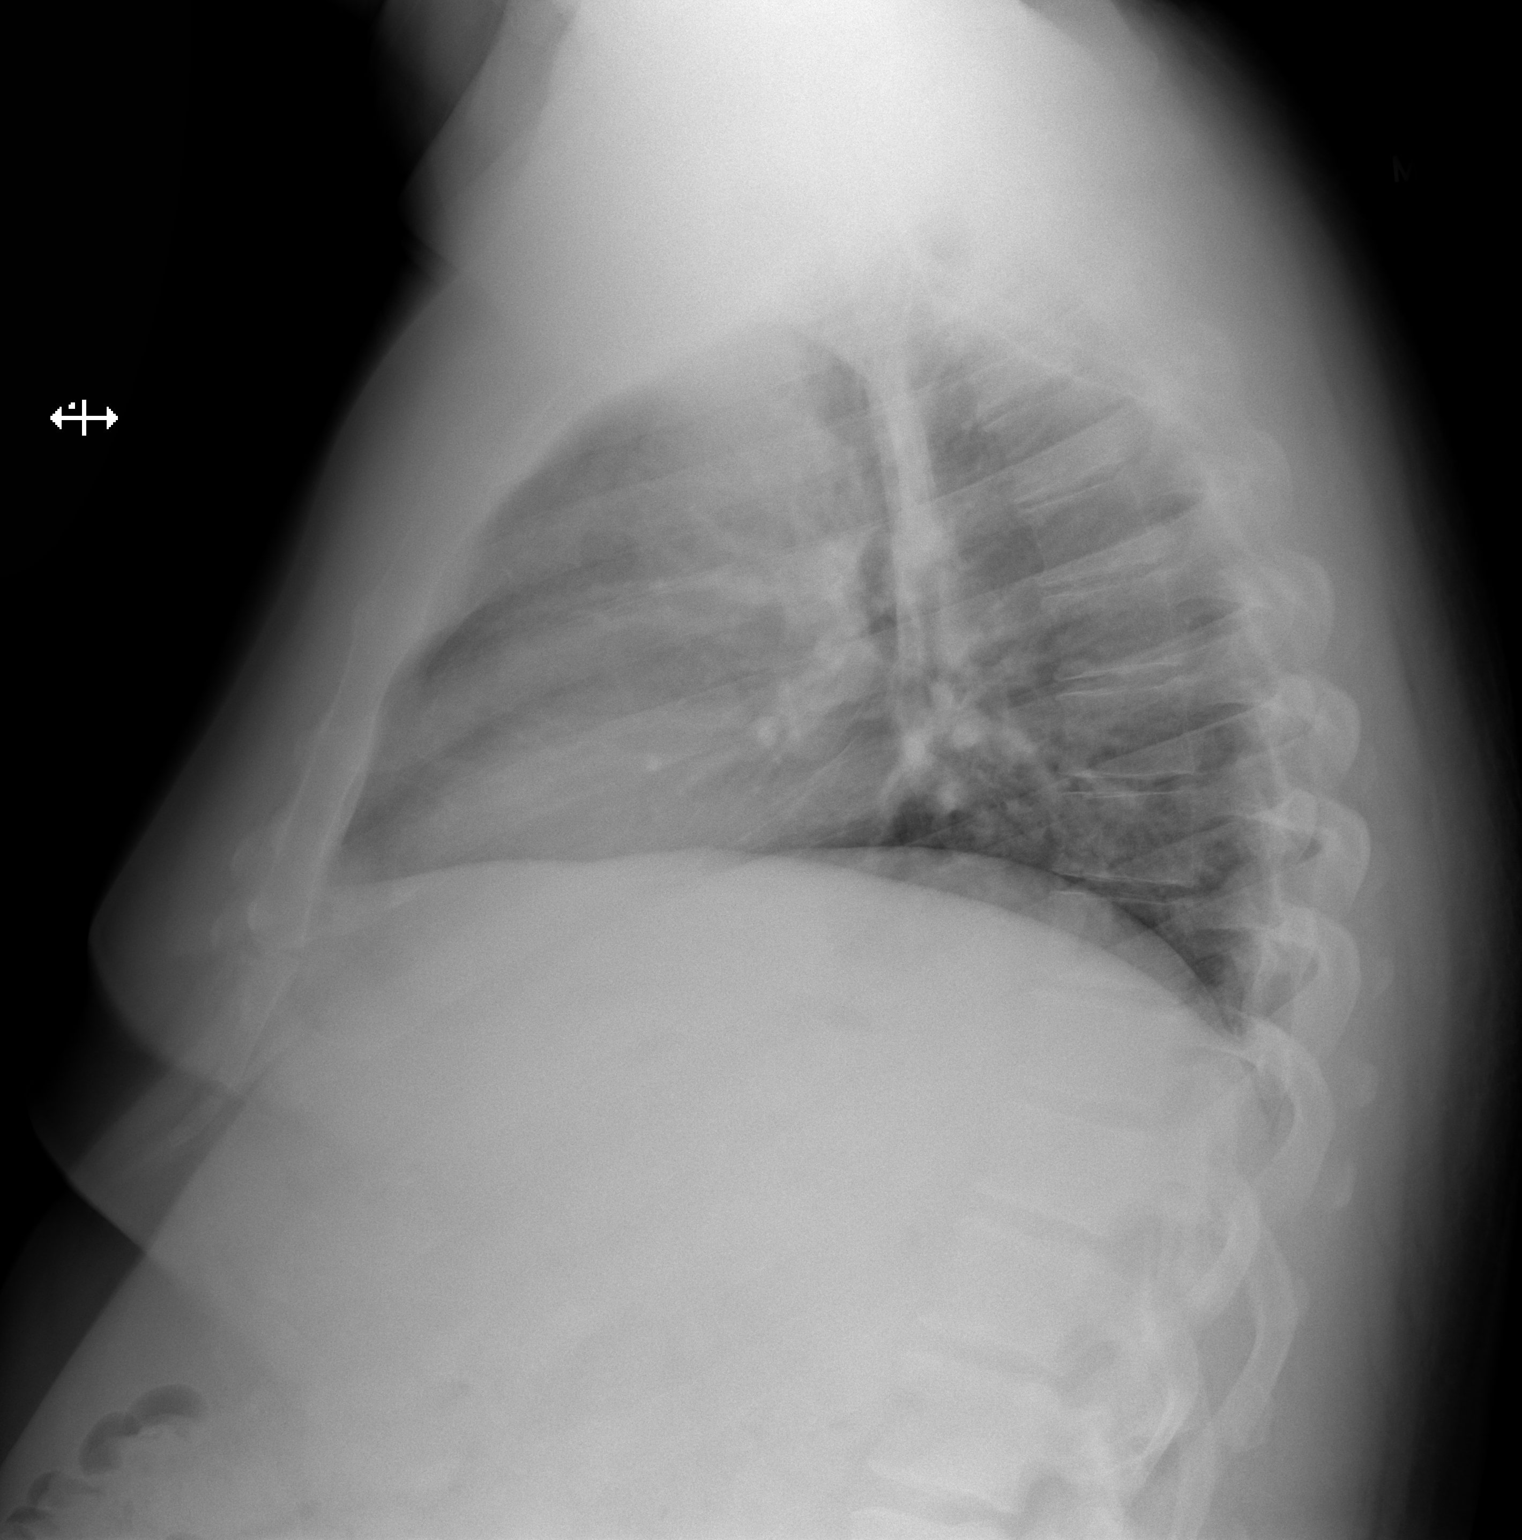

[2 of 2 positions shown; findings below may reference images not displayed]

FINDINGS: The heart size and mediastinal contours are within normal limits.
Both lungs are clear. The visualized skeletal structures are
unremarkable.
IMPRESSION: Negative.  No active cardiopulmonary disease.

## 2022-03-09 ENCOUNTER — Encounter: Payer: Self-pay | Admitting: Nurse Practitioner

## 2022-03-10 ENCOUNTER — Other Ambulatory Visit: Payer: Self-pay | Admitting: Nurse Practitioner

## 2022-03-10 DIAGNOSIS — E1159 Type 2 diabetes mellitus with other circulatory complications: Secondary | ICD-10-CM

## 2022-03-10 MED ORDER — TIRZEPATIDE 15 MG/0.5ML ~~LOC~~ SOAJ: 15.0000 mg | SUBCUTANEOUS | 3 refills | Status: DC

## 2022-03-10 NOTE — Progress Notes (Signed)
Increased Mounjaro dose, pt tolerating well

## 2022-03-11 ENCOUNTER — Other Ambulatory Visit: Payer: Self-pay | Admitting: Nurse Practitioner

## 2022-03-11 DIAGNOSIS — E1159 Type 2 diabetes mellitus with other circulatory complications: Secondary | ICD-10-CM

## 2022-03-11 MED ORDER — TIRZEPATIDE 15 MG/0.5ML ~~LOC~~ SOAJ: 15.0000 mg | SUBCUTANEOUS | 1 refills | Status: DC

## 2022-03-14 ENCOUNTER — Other Ambulatory Visit: Payer: Self-pay | Admitting: Adult Health

## 2022-03-14 DIAGNOSIS — E1159 Type 2 diabetes mellitus with other circulatory complications: Secondary | ICD-10-CM

## 2022-03-14 MED ORDER — MOUNJARO 12.5 MG/0.5ML ~~LOC~~ SOAJ: 12.5000 mg | SUBCUTANEOUS | 0 refills | Status: DC

## 2022-04-17 ENCOUNTER — Encounter: Payer: Self-pay | Admitting: Nurse Practitioner

## 2022-04-20 ENCOUNTER — Other Ambulatory Visit: Payer: Self-pay | Admitting: Adult Health

## 2022-04-20 ENCOUNTER — Other Ambulatory Visit: Payer: Self-pay | Admitting: Internal Medicine

## 2022-04-20 DIAGNOSIS — K219 Gastro-esophageal reflux disease without esophagitis: Secondary | ICD-10-CM

## 2022-04-21 ENCOUNTER — Other Ambulatory Visit: Payer: Self-pay | Admitting: Internal Medicine

## 2022-04-21 DIAGNOSIS — K219 Gastro-esophageal reflux disease without esophagitis: Secondary | ICD-10-CM

## 2022-04-21 MED ORDER — OMEPRAZOLE 40 MG PO CPDR: DELAYED_RELEASE_CAPSULE | ORAL | 3 refills | Status: DC

## 2022-04-23 ENCOUNTER — Other Ambulatory Visit: Payer: Self-pay | Admitting: Infectious Diseases

## 2022-04-23 MED ORDER — CIPRO HC 0.2-1 % OT SUSP: 3.0000 [drp] | Freq: Two times a day (BID) | OTIC | 0 refills | Status: DC

## 2022-05-07 ENCOUNTER — Encounter: Payer: Self-pay | Admitting: Nurse Practitioner

## 2022-05-07 ENCOUNTER — Other Ambulatory Visit: Payer: Self-pay | Admitting: Internal Medicine

## 2022-05-07 MED ORDER — LISINOPRIL 20 MG PO TABS: ORAL_TABLET | ORAL | 0 refills | Status: DC

## 2022-05-21 ENCOUNTER — Encounter: Payer: Self-pay | Admitting: Nurse Practitioner

## 2022-05-21 ENCOUNTER — Ambulatory Visit (INDEPENDENT_AMBULATORY_CARE_PROVIDER_SITE_OTHER): Payer: No Typology Code available for payment source | Admitting: Nurse Practitioner

## 2022-05-21 ENCOUNTER — Other Ambulatory Visit: Payer: Self-pay | Admitting: Internal Medicine

## 2022-05-21 ENCOUNTER — Ambulatory Visit: Payer: No Typology Code available for payment source | Admitting: Nurse Practitioner

## 2022-05-21 VITALS — BP 100/60 | HR 91 | Temp 97.5°F | Ht 72.25 in | Wt 271.0 lb

## 2022-05-21 DIAGNOSIS — Z79899 Other long term (current) drug therapy: Secondary | ICD-10-CM

## 2022-05-21 DIAGNOSIS — E1159 Type 2 diabetes mellitus with other circulatory complications: Secondary | ICD-10-CM | POA: Diagnosis not present

## 2022-05-21 DIAGNOSIS — E1169 Type 2 diabetes mellitus with other specified complication: Secondary | ICD-10-CM

## 2022-05-21 DIAGNOSIS — I10 Essential (primary) hypertension: Secondary | ICD-10-CM

## 2022-05-21 DIAGNOSIS — E669 Obesity, unspecified: Secondary | ICD-10-CM

## 2022-05-21 DIAGNOSIS — E785 Hyperlipidemia, unspecified: Secondary | ICD-10-CM | POA: Diagnosis not present

## 2022-05-21 NOTE — Patient Instructions (Signed)

## 2022-05-21 NOTE — Progress Notes (Signed)
FOLLOW UP  Assessment and Plan:   1. Type 2 diabetes mellitus with other circulatory complication, without long-term current use of insulin (HCC) Controlled Continue current medications.  - Hemoglobin A1c  2. Morbid obesity (HCC) Trending down. Continue medications. Continue lifestyle modifications.   - CBC with Differential/Platelet - COMPLETE METABOLIC PANEL WITH GFR - Lipid panel - Hemoglobin A1c  3. Hypertension, unspecified type Controlled. Continue medications. Continue lifestyle modifications.   - CBC with Differential/Platelet  4. Hyperlipidemia associated with type 2 diabetes mellitus (HCC) Trending down. Continue medications. Continue lifestyle modifications.   - Lipid panel  5. Other fatigue Requesting to stay on current dosage of Monjaro. Will continue to monitor.  - COMPLETE METABOLIC PANEL WITH GFR   Continue diet and meds as discussed. Further disposition pending results of labs. Discussed med's effects and SE's.   Over 30 minutes of exam, counseling, chart review, and critical decision making was performed.   Future Appointments  Date Time Provider Department Center  09/05/2022  2:00 PM Raynelle Dick, NP [facility] None    ----------------------------------------------------------------------------------------------------------------------  HPI 35 y.o. male  presents for 3 month follow up on hypertension, cholesterol, diabetes, weight and vitamin D deficiency.   He is continuing Mounjaro successfully for weight loss and A1C control.  He has lost 30 lb.  However, he has noticed since being on the  medication he has lost much of his drive to do things, including working and having sex. State he could "care less" whether he goes to work or has sex. He continues to engage in work and sex, however, not driven. Reports this is a huge change for him.  He is able to have sex and have an erection.  He has completed a sleep study for OSA and reports  negative.    Denies any new medication changes.   He is a former smoker quit 2019.  He denies any extreme mood swings, HI/SI, change in life stressors.    BMI is Body mass index is 36.5 kg/m., he has been working on diet and exercise. Wt Readings from Last 3 Encounters:  05/21/22 271 lb (122.9 kg)  02/14/22 292 lb (132.5 kg)  11/06/21 (!) 321 lb (145.6 kg)    His blood pressure has been controlled at home, today their BP is BP: 100/60  He does workout. He denies chest pain, shortness of breath, dizziness.   He is on cholesterol medication Rosuvastatin and denies myalgias. His cholesterol is trending towards goal. The cholesterol last visit was:   Lab Results  Component Value Date   CHOL 79 02/14/2022   HDL 30 (L) 02/14/2022   LDLCALC 30 02/14/2022   TRIG 111 02/14/2022   CHOLHDL 2.6 02/14/2022    He has been working on diet and exercise for prediabetes, and denies hyperglycemia, hypoglycemia , paresthesia of the feet, polydipsia, and polyuria. Last A1C in the office was:  Lab Results  Component Value Date   HGBA1C 6.1 (H) 02/14/2022   Patient is on Vitamin D supplement.   Lab Results  Component Value Date   VD25OH 58 07/31/2021        Current Medications:  Current Outpatient Medications on File Prior to Visit  Medication Sig  . empagliflozin (JARDIANCE) 25 MG TABS tablet Take  1 tablet  Daily  for Diabetes  . glucose blood (FREESTYLE LITE) test strip Test sugar once daily  . lisinopril (ZESTRIL) 20 MG tablet TAKE ONE TABLET BY MOUTH DAILY FOR BLOOD PRESSURE  . metFORMIN (GLUCOPHAGE) 500  MG tablet TAKE TWO TABLETS BY MOUTH TWICE A DAY WITH MEALS FOR DIABETES  . omeprazole (PRILOSEC) 40 MG capsule Take   1 capsule  Daily  to Prevent Heartburn & Indigestion                                    /                         TAKE                           BY                         MOUTH (Patient taking differently: Take one tablet twice weekly)  . rosuvastatin (CRESTOR) 40 MG  tablet TAKE ONE TABLET BY MOUTH DAILY FOR HIGH CHOLESTEROL  . tirzepatide (MOUNJARO) 12.5 MG/0.5ML Pen Inject 12.5 mg into the skin once a week. (Patient taking differently: Inject 15 mg into the skin once a week.)  . tirzepatide (MOUNJARO) 15 MG/0.5ML Pen Inject 15 mg into the skin once a week.  . ciprofloxacin-hydrocortisone (CIPRO HC) OTIC suspension Place 3 drops into the right ear 2 (two) times daily.  . Cyanocobalamin 500 MCG SUBL Place 1 tablet under the tongue daily. (Patient not taking: Reported on 05/21/2022)  . fluticasone (FLONASE) 50 MCG/ACT nasal spray INSTILL TWO SPRAYS IN EACH NOSTRIL TWICE A DAY (Patient not taking: Reported on 05/21/2022)  . zinc gluconate 50 MG tablet Take 50 mg by mouth daily. (Patient not taking: Reported on 05/21/2022)   No current facility-administered medications on file prior to visit.     Allergies:  Allergies  Allergen Reactions  . Bisoprolol     Numbness, tingling, feels bad  . Codeine Swelling     Medical History:  Past Medical History:  Diagnosis Date  . Alcohol consumption binge drinking 07/17/2018   12-24 beers on the weekends  . Diabetes mellitus without complication (HCC)   . Former smoker (quit 07/2018, 7.5 pack year history) 07/17/2018  . History of COVID-19 11/15/2019  . Hypertension   . Morbid obesity (HCC)   . Stroke [facility])    "mini stroke"   Family history- Reviewed and unchanged Social history- Reviewed and unchanged   Review of Systems:  ROS    Physical Exam: BP 100/60   Pulse 91   Temp (!) 97.5 F (36.4 C)   Ht 6' 0.25" (1.835 m)   Wt 271 lb (122.9 kg)   SpO2 97%   BMI 36.50 kg/m  Wt Readings from Last 3 Encounters:  05/21/22 271 lb (122.9 kg)  02/14/22 292 lb (132.5 kg)  11/06/21 (!) 321 lb (145.6 kg)   General Appearance: Well nourished, in no apparent distress. Eyes: PERRLA, EOMs, conjunctiva no swelling or erythema Sinuses: No Frontal/maxillary tenderness ENT/Mouth: Ext aud canals clear, TMs without  erythema, bulging. No erythema, swelling, or exudate on post pharynx.  Tonsils not swollen or erythematous. Hearing normal.  Neck: Supple, thyroid normal.  Respiratory: Respiratory effort normal, BS equal bilaterally without rales, rhonchi, wheezing or stridor.  Cardio: RRR with no MRGs. Brisk peripheral pulses without edema.  Abdomen: Soft, + BS.  Non tender, no guarding, rebound, hernias, masses. Lymphatics: Non tender without lymphadenopathy.  Musculoskeletal: Full ROM, 5/5 strength, Normal gait Skin: Warm, dry without rashes, lesions, ecchymosis.  Neuro: Cranial nerves intact. No cerebellar symptoms.  Psych: Awake and oriented X 3, normal affect, Insight and Judgment appropriate.    Adela Glimpse, NP 3:44 PM [facility]

## 2022-05-22 LAB — COMPLETE METABOLIC PANEL WITHOUT GFR
AG Ratio: 2 (calc) (ref 1.0–2.5)
ALT: 41 U/L (ref 9–46)
AST: 16 U/L (ref 10–40)
Albumin: 4.7 g/dL (ref 3.6–5.1)
Alkaline phosphatase (APISO): 37 U/L (ref 36–130)
BUN: 10 mg/dL (ref 7–25)
CO2: 26 mmol/L (ref 20–32)
Calcium: 9.9 mg/dL (ref 8.6–10.3)
Chloride: 102 mmol/L (ref 98–110)
Creat: 0.93 mg/dL (ref 0.60–1.26)
Globulin: 2.4 g/dL (ref 1.9–3.7)
Glucose, Bld: 94 mg/dL (ref 65–99)
Potassium: 4.5 mmol/L (ref 3.5–5.3)
Sodium: 138 mmol/L (ref 135–146)
Total Bilirubin: 1.7 mg/dL — ABNORMAL HIGH (ref 0.2–1.2)
Total Protein: 7.1 g/dL (ref 6.1–8.1)
eGFR: 108 mL/min/1.73m2

## 2022-05-22 LAB — CBC WITH DIFFERENTIAL/PLATELET
Absolute Monocytes: 681 cells/uL (ref 200–950)
Basophils Absolute: 33 cells/uL (ref 0–200)
Basophils Relative: 0.4 %
Eosinophils Absolute: 349 cells/uL (ref 15–500)
Eosinophils Relative: 4.2 %
HCT: 42 % (ref 38.5–50.0)
Hemoglobin: 14.3 g/dL (ref 13.2–17.1)
Lymphs Abs: 2822 cells/uL (ref 850–3900)
MCH: 29.5 pg (ref 27.0–33.0)
MCHC: 34 g/dL (ref 32.0–36.0)
MCV: 86.6 fL (ref 80.0–100.0)
MPV: 9.1 fL (ref 7.5–12.5)
Monocytes Relative: 8.2 %
Neutro Abs: 4416 cells/uL (ref 1500–7800)
Neutrophils Relative %: 53.2 %
Platelets: 357 10*3/uL (ref 140–400)
RBC: 4.85 10*6/uL (ref 4.20–5.80)
RDW: 12.9 % (ref 11.0–15.0)
Total Lymphocyte: 34 %
WBC: 8.3 10*3/uL (ref 3.8–10.8)

## 2022-05-22 LAB — LIPID PANEL
Cholesterol: 96 mg/dL
HDL: 36 mg/dL — ABNORMAL LOW
LDL Cholesterol (Calc): 43 mg/dL
Non-HDL Cholesterol (Calc): 60 mg/dL
Total CHOL/HDL Ratio: 2.7 (calc)
Triglycerides: 90 mg/dL

## 2022-05-22 LAB — HEMOGLOBIN A1C
Hgb A1c MFr Bld: 5.5 % of total Hgb (ref <5.7)
Mean Plasma Glucose: 111 mg/dL
eAG (mmol/L): 6.2 mmol/L

## 2022-05-23 ENCOUNTER — Encounter: Payer: Self-pay | Admitting: Nurse Practitioner

## 2022-05-27 ENCOUNTER — Other Ambulatory Visit: Payer: Self-pay

## 2022-05-27 DIAGNOSIS — E1169 Type 2 diabetes mellitus with other specified complication: Secondary | ICD-10-CM

## 2022-05-27 MED ORDER — ROSUVASTATIN CALCIUM 40 MG PO TABS: ORAL_TABLET | ORAL | 0 refills | Status: DC

## 2022-07-04 ENCOUNTER — Encounter: Payer: Self-pay | Admitting: Nurse Practitioner

## 2022-08-01 ENCOUNTER — Telehealth: Payer: Self-pay | Admitting: Nurse Practitioner

## 2022-08-01 ENCOUNTER — Other Ambulatory Visit: Payer: Self-pay | Admitting: Nurse Practitioner

## 2022-08-01 DIAGNOSIS — E1159 Type 2 diabetes mellitus with other circulatory complications: Secondary | ICD-10-CM

## 2022-08-01 MED ORDER — MOUNJARO 12.5 MG/0.5ML ~~LOC~~ SOAJ: 12.5000 mg | SUBCUTANEOUS | 0 refills | Status: DC

## 2022-08-01 NOTE — Telephone Encounter (Signed)
Pt is needing a refill on [facility]

## 2022-08-03 ENCOUNTER — Encounter: Payer: Self-pay | Admitting: Nurse Practitioner

## 2022-08-03 DIAGNOSIS — E1159 Type 2 diabetes mellitus with other circulatory complications: Secondary | ICD-10-CM

## 2022-08-04 MED ORDER — TIRZEPATIDE 15 MG/0.5ML ~~LOC~~ SOAJ
15.0000 mg | SUBCUTANEOUS | 1 refills | Status: DC
Start: 2022-08-04 — End: 2022-10-03

## 2022-09-04 NOTE — Progress Notes (Deleted)
 Complete Physical  Assessment and Plan:  Jared Ramsey was seen today for annual exam  Diagnoses and all orders for this visit:  Encounter for Annual Physical Exam with abnormal findings Due annually  Health Maintenance reviewed Healthy lifestyle reviewed and goals set  Hypertension, unspecified type Continue meds Monitor blood pressure at home; call if consistently over 130/80 Continue DASH diet.   Reminder to go to the ER if any CP, SOB, nausea, dizziness, severe HA, changes vision/speech, left arm numbness and tingling and jaw pain. -     CBC with Differential/Platelet -     COMPLETE METABOLIC PANEL WITH GFR -     Magnesium  Type 2 diabetes mellitus with other specified complication, without long-term current use of insulin [facility]) Education: Reviewed 'ABCs' of diabetes management (respective goals in parentheses):  A1C (<7), blood pressure (<130/80), and cholesterol (LDL <70) Eye Exam yearly and Dental Exam every 6 months. Dietary recommendations Physical Activity recommendations -     Hemoglobin A1c -     Urinalysis, Routine w reflex microscopic  Hyperlipidemia associated with type 2 diabetes mellitus (HCC) Continue medications: rosuvastatin 40 mg daily LDL goal <70, may be able to reduce dose if remains very low Continue low cholesterol diet and exercise.  Check lipid panel.  -     Lipid panel -     TSH  History of CVA (cerebrovascular accident) Has seen neurology Dr. Leonie Man  Attributed to vasoactive medication; avoid vasoconstrictive agents, sudafed, phentermiene Continue ASA No suspected recurrence Control blood pressure, cholesterol, glucose, increase exercise.  -     Lipid panel  Morbid obesity ([place])- BMI 42 with T2DM, hld Long discussion about weight loss, diet, and exercise Recommended diet heavy in fruits and veggies and low in animal meats, cheeses, and dairy products, appropriate calorie intake Patient will work on cutting down on portions, avoiding binge  eating/drinking Discussed appropriate weight for height and initial goal (<300lb) Couldn't tolerate ozempic, bydurion Declines meds, wants to try monthly weigh in for accountability and consistency High fiber diet handout given  Follow up at next visit  Environmental allergies Continue OTC allergy pills  Alcohol consumption binge drinking Has stopped binge drinking; monitor   Former smoker Has quit smoking Strategies for ongoing success discussed  Right carotid atherosclerosis [place] ordered and pending, plans to get end of the year Control blood pressure, cholesterol, glucose, increase exercise.  -     Lipid panel  GERD Well managed on current medications;  Discussed diet, avoiding triggers and other lifestyle changes  No orders of the defined types were placed in this encounter.   Discussed med's effects and SE's. Screening labs and tests as requested with regular follow-up as recommended. Over 40 minutes of exam, counseling, chart review and critical decision making was performed   Future Appointments  Date Time Provider [place]  09/05/2022  2:00 PM Alycia Rossetti, NP [facility] None    HPI 36 y.o. male patient presents for CPE. He has Hypertension; History of CVA (cerebrovascular accident); Morbid obesity ([place]); Environmental allergies; Stenosis of right carotid artery; Type 2 diabetes mellitus with circulatory disorder ([place]); Hyperlipidemia associated with type 2 diabetes mellitus ([place]); Acid reflux; Liver lesion, right lobe; Fatty liver; Testosterone deficiency; Fatigue; Poor sleep pattern; Vitamin D deficiency; and Left shoulder tendonitis on their problem list.   He is married, has 71 y/o daughter, baby boy was born 02/2021, sleeping well. he is a Games developer, working better hours. He is requesting referral for vasectomy.   In 07/2018  he was evaluated in ED for right sided headache and extremity heaviness. CTA head showed Subtle low-density in the  superior paramedian right cerebellum, compared to MRI in 2017. CTA showed Mild noncalcified plaque at the right common carotid bifurcationand left vertebral origin. He was evaluated by Dr. Leonie Man and concluded episode of left hemispheric TIA in October 2019 likely from vasoconstriction from Sudafed use.  Prior history of likely right cerebellar infarct in 2017 secondary to cerebral reversible vasoconstriction syndrome. He was recommended aggressive lifestyle modification and avoidance of vasoconstrictive agents, sudafed, phentermine.  History of binge drinking (12-24 on weekend), significantly reduced, now max 6 in a week. Former smoker; 7.5 pack year history, quit in 07/2018 and doing well.   He has pepcid 20 mg daily, 2nd PRN for reflux and doing well. Very rarely takes reglan.   He has known fatty liver per [place] 10/2020; also showed focal angular lesion in the central RIGHT hepatic lobe measuring 3.8 cm, MRI was ordered but remains pending, he plans to schedule prior to the end of the year.   BMI is There is no height or weight on file to calculate BMI., he admits hasn't been working on diet/exericse, would like to try coming in for monthly checks for accountability.  Wt Readings from Last 3 Encounters:  05/21/22 271 lb (122.9 kg)  02/14/22 292 lb (132.5 kg)  11/06/21 (!) 321 lb (145.6 kg)   His blood pressure has been controlled at home, today their BP is   He does workout. He denies chest pain, shortness of breath, dizziness.   He is on cholesterol medication (rosuvastatin 40 mg daily) and denies myalgias.  His cholesterol is at LDL goal of <70. The cholesterol last visit was:   Lab Results  Component Value Date   CHOL 96 05/21/2022   HDL 36 (L) 05/21/2022   LDLCALC 43 05/21/2022   TRIG 90 05/21/2022   CHOLHDL 2.7 05/21/2022    He has been working on diet and exercise for T2DM with circulatory complication, treated by metformin 2000 mg daily, and jardiance 25 mg, had nausea with ozempic,  trulicity at starting dose and was unable to tolerate, he is on bASA, he is on ACE/ARB and denies foot ulcerations, increased appetite, nausea, polydipsia, polyuria, visual disturbances and vomiting. Admits hasn't been checking fasting glucose  Last A1C in the office was:  Lab Results  Component Value Date   HGBA1C 5.5 05/21/2022    Last GFR: Lab Results  Component Value Date   GFRNONAA 97 03/08/2021   Taking 10000 IU  Lab Results  Component Value Date   VD25OH 58 07/31/2021     He is on zinc supplement since last OV;  Lab Results  Component Value Date   TESTOSTERONE 159 (L) 07/31/2021      Current Medications:  Current Outpatient Medications on File Prior to Visit  Medication Sig Dispense Refill   ciprofloxacin-hydrocortisone (CIPRO HC) OTIC suspension Place 3 drops into the right ear 2 (two) times daily. 10 mL 0   Cyanocobalamin 500 MCG SUBL Place 1 tablet under the tongue daily. (Patient not taking: Reported on 05/21/2022)     empagliflozin (JARDIANCE) 25 MG TABS tablet Take  1 tablet  Daily  for Diabetes                                              /  TAKE                                   BY                             MOUTH 90 tablet 2   fluticasone (FLONASE) 50 MCG/ACT nasal spray INSTILL TWO SPRAYS IN EACH NOSTRIL TWICE A DAY (Patient not taking: Reported on 05/21/2022) 16 g 2   glucose blood (FREESTYLE LITE) test strip Test sugar once daily 50 each 12   lisinopril (ZESTRIL) 20 MG tablet TAKE ONE TABLET BY MOUTH DAILY FOR BLOOD PRESSURE 90 tablet 0   metFORMIN (GLUCOPHAGE) 500 MG tablet TAKE TWO TABLETS BY MOUTH TWICE A DAY WITH MEALS FOR DIABETES 360 tablet 2   omeprazole (PRILOSEC) 40 MG capsule Take   1 capsule  Daily  to Prevent Heartburn & Indigestion                                    /                         TAKE                           BY                         MOUTH (Patient taking differently: Take one tablet twice weekly) 90 capsule  3   rosuvastatin (CRESTOR) 40 MG tablet TAKE ONE TABLET BY MOUTH DAILY FOR HIGH CHOLESTEROL 90 tablet 0   tirzepatide (MOUNJARO) 15 MG/0.5ML Pen Inject 15 mg into the skin once a week. 6 mL 1   zinc gluconate 50 MG tablet Take 50 mg by mouth daily. (Patient not taking: Reported on 05/21/2022)     No current facility-administered medications on file prior to visit.   Allergies:  Allergies  Allergen Reactions   Bisoprolol     Numbness, tingling, feels bad   Codeine Swelling   Health Maintenance:  Immunization History  Administered Date(s) Administered   Hepatitis A 07/11/2008, 05/30/2009   Hepatitis B 08/23/1997, 01/24/1998, 07/19/1998   IPV 07/21/2008   Influenza Inj Mdck Quad With Preservative 07/27/2019, 07/27/2020   Influenza,inj,Quad PF,6+ Mos 07/31/2021   MMR 08/23/1997   Pneumococcal Polysaccharide-23 07/27/2020   Td 01/24/1998, 10/26/2018   Tdap 07/11/2008   Typhoid Live 07/11/2008    Tetanus: 2020 Pneum: 07/2020 Flu vaccine: DUE, TODAY HPV: declines Covid 19: declines   Sleep study: 08/2018 normal  CTA head/neck: 07/2018 Mild noncalcified plaque at the right common carotid bifurcation and left vertebral origin. There is related 50% narrowing at the left vertebral origin. [place] order in system to schedule.  MRI brain: 07/2018  Colonoscopy: age 67 EGD: n/a  Eye Exam: OVERDUE, he will schedule diabetic eye first of the year with new insurance Dentist: Dr. ?, last visit 2022, goes q84m  Patient Care Team: MUnk Pinto MD as PCP - General (Internal Medicine) SGarvin Fila MD as Consulting Physician (Neurology)  Medical History:  has Hypertension; History of CVA (cerebrovascular accident); Morbid obesity ([place]; Environmental allergies; Stenosis of right carotid artery; Type 2 diabetes mellitus with circulatory disorder ([place]; Hyperlipidemia associated with type 2  diabetes mellitus ([place]); Acid reflux; Liver lesion, right lobe; Fatty liver; Testosterone  deficiency; Fatigue; Poor sleep pattern; Vitamin D deficiency; and Left shoulder tendonitis on their problem list. Surgical History:  He  has a past surgical history that includes Tonsillectomy and Plantar's wart excision (Left, 2003). Family History:  His family history includes Alzheimer's disease in his maternal grandmother and paternal grandmother; Diabetes in his maternal grandmother, mother, and sister; Emphysema in his paternal grandmother; Epilepsy in his sister; Hyperlipidemia in his father and paternal grandfather; Hypertension in his father, maternal grandmother, mother, paternal grandfather, and sister; Lymphoma in his mother. Social History:   reports that he quit smoking about 4 years ago. His smoking use included cigarettes. He has a 7.50 pack-year smoking history. He has never used smokeless tobacco. He reports current alcohol use of about 6.0 standard drinks of alcohol per week. He reports that he does not use drugs.  Review of Systems:  Review of Systems  Constitutional:  Negative for malaise/fatigue and weight loss.  HENT:  Negative for hearing loss and tinnitus.   Eyes:  Negative for blurred vision and double vision.  Respiratory:  Negative for cough, shortness of breath and wheezing.   Cardiovascular:  Negative for chest pain, palpitations, orthopnea, claudication and leg swelling.  Gastrointestinal:  Negative for abdominal pain, blood in stool, constipation, diarrhea, heartburn, melena, nausea and vomiting.  Genitourinary: Negative.   Musculoskeletal:  Negative for joint pain and myalgias.  Skin:  Negative for rash.  Neurological:  Negative for dizziness, tingling, sensory change, weakness and headaches.  Endo/Heme/Allergies:  Negative for polydipsia.  Psychiatric/Behavioral:  Negative for depression, memory loss and substance abuse. The patient is not nervous/anxious and does not have insomnia.   All other systems reviewed and are negative.   Physical Exam: Estimated  body mass index is 36.5 kg/m as calculated from the following:   Height as of 05/21/22: 6' 0.25" (1.835 m).   Weight as of 05/21/22: 271 lb (122.9 kg). There were no vitals taken for this visit. General Appearance: Well nourished, in no apparent distress.  Eyes: PERRLA, EOMs, conjunctiva no swelling or erythema, Sinuses: No Frontal/maxillary tenderness  ENT/Mouth: Ext aud canals clear, normal light reflex with TMs without erythema, bulging. Good dentition. No erythema, swelling, or exudate on post pharynx. Tonsils not swollen or erythematous. Hearing normal.  Neck: Supple, thyroid normal. No bruits  Respiratory: Respiratory effort normal, BS equal bilaterally without rales, rhonchi, wheezing or stridor.  Cardio: RRR without murmurs, rubs or gallops. Brisk peripheral pulses without edema.  Chest: symmetric, with normal excursions and percussion.  Abdomen: Soft/obese abdomen, non-tender, no guarding, rebound, palpable hernias, masses, or organomegaly.  Lymphatics: Non tender without lymphadenopathy.  Genitourinary: Declines Musculoskeletal: Full ROM all peripheral extremities,5/5 strength, and normal gait.  Skin: Warm, dry without rashes, lesions, ecchymosis. Neuro: Cranial nerves intact, reflexes equal bilaterally. Normal muscle tone, no cerebellar symptoms. Sensation intact to monofilament.  Psych: Awake and oriented X 3, normal affect, Insight and Judgment appropriate.    EKG: NSR  Daisia Slomski E  9:28 AM [facility]

## 2022-09-05 ENCOUNTER — Encounter: Payer: No Typology Code available for payment source | Admitting: Nurse Practitioner

## 2022-10-02 NOTE — Progress Notes (Signed)
 Complete Physical  Assessment and Plan:  Jared Ramsey was seen today for annual exam  Diagnoses and all orders for this visit:  Encounter for Annual Physical Exam with abnormal findings Due annually  Health Maintenance reviewed Healthy lifestyle reviewed and goals set  Hypertension, unspecified type Currently off meds Monitor blood pressure at home; call if consistently over 130/80 Continue DASH diet.   Reminder to go to the ER if any CP, SOB, nausea, dizziness, severe HA, changes vision/speech, left arm numbness and tingling and jaw pain. -     CBC with Differential/Platelet -     COMPLETE METABOLIC PANEL WITH GFR -     Magnesium  Type 2 diabetes mellitus with other specified complication, without long-term current use of insulin [facility]) Education: Reviewed 'ABCs' of diabetes management (respective goals in parentheses):  A1C (<7), blood pressure (<130/80), and cholesterol (LDL <70) Eye Exam yearly and Dental Exam every 6 months. Dietary recommendations Physical Activity recommendations -     Hemoglobin A1c -     Urinalysis, Routine w reflex microscopic  Hyperlipidemia associated with type 2 diabetes mellitus (HCC) Stopped Rosuvastatin on Jared Ramsey own, still has and will plan to restart pendin lab results LDL goal <70, may be able to reduce dose if remains very low Continue low cholesterol diet and exercise.  Check lipid panel.  -     Lipid panel -     TSH  History of CVA (cerebrovascular accident) Has seen neurology Dr. Leonie Man  Attributed to vasoactive medication; avoid vasoconstrictive agents, sudafed, phentermiene Continue ASA No suspected recurrence Control blood pressure, cholesterol, glucose, increase exercise.  -     Lipid panel  Morbid obesity ([place])- BMI 42 with T2DM, hld Long discussion about weight loss, diet, and exercise Recommended diet heavy in fruits and veggies and low in animal meats, cheeses, and dairy products, appropriate calorie intake Is working out and has  cut back on alcohol Discussed appropriate weight for height and initial goal (<300lb) Currently doing very well on Mounjaro 15 mg SQ QW High fiber diet handout given  Follow up at next visit  Environmental allergies Continue OTC allergy pills  Alcohol consumption binge drinking Has stopped binge drinking; monitor   Former smoker Has quit smoking Strategies for ongoing success discussed  Right carotid atherosclerosis [place] ordered and pending, plans to get end of the year Control blood pressure, cholesterol, glucose, increase exercise.  -     Lipid panel  GERD Well managed on current medications;  Discussed diet, avoiding triggers and other lifestyle changes  Vitamin D deficiency Continue Vit D supplementation to maintain value in therapeutic level of 60-100 - Vit D  Medication Management - Magnesium  Screening for ischemic heart disease - EKG  Screening for hematuria/proteinuria - Routine UA with reflex microscopic - Microalbumin/creatinine urine ratio  Flu Vaccine Need Quad FLu 6 MOS + PF IM given   Discussed med's effects and SE's. Screening labs and tests as requested with regular follow-up as recommended. Over 40 minutes of exam, counseling, chart review and critical decision making was performed   Future Appointments  Date Time Provider [place]  10/03/2022  3:00 PM Alycia Rossetti, NP [facility] None  10/07/2023  3:00 PM Alycia Rossetti, NP [facility] None    HPI 36 y.o. male patient presents for CPE. Jared Ramsey has Hypertension; History of CVA (cerebrovascular accident); Morbid obesity ([place]); Environmental allergies; Stenosis of right carotid artery; Type 2 diabetes mellitus with circulatory disorder ([place]); Hyperlipidemia associated with type 2 diabetes mellitus ([place]);  Acid reflux; Liver lesion, right lobe; Fatty liver; Testosterone deficiency; Fatigue; Poor sleep pattern; Vitamin D deficiency; and Left shoulder tendonitis on Jared Ramsey problem list.   Jared Ramsey is  married, has 15 y/o daughter, baby boy was born 02/2021, sleeping well. 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 .  Jared Ramsey did not get vasectomy  In 07/2018 Jared Ramsey was evaluated in ED for right sided headache and extremity heaviness. CTA head showed Subtle low-density in the superior paramedian right cerebellum, compared to MRI in 2017. CTA showed Mild noncalcified plaque at the right common carotid bifurcationand left vertebral origin. Jared Ramsey was evaluated by Dr. Leonie Man and concluded episode of left hemispheric TIA in October 2019 likely from vasoconstriction from Sudafed use.  Prior history of likely right cerebellar infarct in 2017 secondary to cerebral reversible vasoconstriction syndrome. Jared Ramsey was recommended aggressive lifestyle modification and avoidance of vasoconstrictive agents, sudafed, phentermine.  History of binge drinking (12-24 on weekend), significantly reduced, now max 6 in a week, hard liquor. Former smoker; 7.5 pack year history, quit in 07/2018 and doing well.   Reflux has been well controlled and rarely uses Omeprazole.   Jared Ramsey has known fatty liver per [place] 10/2020; also showed focal angular lesion in the central RIGHT hepatic lobe measuring 3.8 cm, MRI was ordered but remains pending,unable to afford currently  BMI is Body mass index is 36.23 kg/m., Jared Ramsey admits Jared Ramsey is doing 5K's , joining [employer] Readings from Last 3 Encounters:  10/03/22 274 lb 9.6 oz (124.6 kg)  05/21/22 271 lb (122.9 kg)  02/14/22 292 lb (132.5 kg)   Jared Ramsey blood pressure has been controlled at home, today Jared Ramsey BP is BP: 114/70  BP Readings from Last 3 Encounters:  10/03/22 114/70  05/21/22 100/60  02/14/22 120/70  Jared Ramsey does workout. Jared Ramsey denies chest pain, shortness of breath, dizziness.   Jared Ramsey is on cholesterol medication (was on rosuvastatin 40 mg daily but discontinued) and denies myalgias.  Jared Ramsey cholesterol is at LDL goal of <70. The cholesterol last visit was:   Lab Results  Component Value Date   CHOL  96 05/21/2022   HDL 36 (L) 05/21/2022   LDLCALC 43 05/21/2022   TRIG 90 05/21/2022   CHOLHDL 2.7 05/21/2022    Jared Ramsey has been working on diet and exercise for T2DM with circulatory complication, currently only on Mounjaro 15 mg SQ QW, Jared Ramsey is on bASA, Jared Ramsey is on ACE/ARB and denies foot ulcerations, increased appetite, nausea, polydipsia, polyuria, visual disturbances and vomiting. Admits hasn't been checking fasting glucose, occ 100-120 Last A1C in the office was:  Lab Results  Component Value Date   HGBA1C 5.5 05/21/2022    Drinking a lot of water. Last GFR: Lab Results  Component Value Date   EGFR 108 05/21/2022    Taking 10000 IU  Lab Results  Component Value Date   VD25OH 58 07/31/2021     Jared Ramsey is no longer taking zinc supplementation.  Lab Results  Component Value Date   TESTOSTERONE 159 (L) 07/31/2021      Current Medications:  Current Outpatient Medications on File Prior to Visit  Medication Sig Dispense Refill   tirzepatide (MOUNJARO) 15 MG/0.5ML Pen Inject 15 mg into the skin once a week. 6 mL 1   ciprofloxacin-hydrocortisone (CIPRO HC) OTIC suspension Place 3 drops into the right ear 2 (two) times daily. (Patient not taking: Reported on 10/03/2022) 10 mL 0   Cyanocobalamin 500 MCG SUBL Place 1 tablet under the tongue daily. (Patient not  taking: Reported on 05/21/2022)     empagliflozin (JARDIANCE) 25 MG TABS tablet Take  1 tablet  Daily  for Diabetes                                              /                              TAKE                                   BY                             MOUTH (Patient not taking: Reported on 10/03/2022) 90 tablet 2   fluticasone (FLONASE) 50 MCG/ACT nasal spray INSTILL TWO SPRAYS IN EACH NOSTRIL TWICE A DAY (Patient not taking: Reported on 05/21/2022) 16 g 2   glucose blood (FREESTYLE LITE) test strip Test sugar once daily (Patient not taking: Reported on 10/03/2022) 50 each 12   lisinopril (ZESTRIL) 20 MG tablet TAKE ONE TABLET BY  MOUTH DAILY FOR BLOOD PRESSURE (Patient not taking: Reported on 10/03/2022) 90 tablet 0   metFORMIN (GLUCOPHAGE) 500 MG tablet TAKE TWO TABLETS BY MOUTH TWICE A DAY WITH MEALS FOR DIABETES (Patient not taking: Reported on 10/03/2022) 360 tablet 2   omeprazole (PRILOSEC) 40 MG capsule Take   1 capsule  Daily  to Prevent Heartburn & Indigestion                                    /                         TAKE                           BY                         MOUTH (Patient not taking: Reported on 10/03/2022) 90 capsule 3   rosuvastatin (CRESTOR) 40 MG tablet TAKE ONE TABLET BY MOUTH DAILY FOR HIGH CHOLESTEROL (Patient not taking: Reported on 10/03/2022) 90 tablet 0   zinc gluconate 50 MG tablet Take 50 mg by mouth daily. (Patient not taking: Reported on 05/21/2022)     No current facility-administered medications on file prior to visit.   Allergies:  Allergies  Allergen Reactions   Bisoprolol     Numbness, tingling, feels bad   Codeine Swelling   Health Maintenance:  Immunization History  Administered Date(s) Administered   Hepatitis A 07/11/2008, 05/30/2009   Hepatitis B 08/23/1997, 01/24/1998, 07/19/1998   IPV 07/21/2008   Influenza Inj Mdck Quad With Preservative 07/27/2019, 07/27/2020   Influenza,inj,Quad PF,6+ Mos 07/31/2021   MMR 08/23/1997   Pneumococcal Polysaccharide-23 07/27/2020   Td 01/24/1998, 10/26/2018   Tdap 07/11/2008   Typhoid Live 07/11/2008    Tetanus: 2020 Pneum: 07/2020 Flu vaccine: DUE, TODAY HPV: declines Covid 19: declines   Sleep study: 08/2018 normal  CTA head/neck: 07/2018 Mild noncalcified plaque at the right common carotid bifurcation and left vertebral  origin. There is related 50% narrowing at the left vertebral origin. [place] order in system to schedule.  MRI brain: 07/2018  Colonoscopy: age 70 EGD: n/a  Eye Exam: OVERDUE, Jared Ramsey will schedule diabetic eye first of the year with new insurance Dentist: Dr. ?, last visit 2022, goes q17m  Patient  Care Team: MUnk Pinto MD as PCP - General (Internal Medicine) SGarvin Fila MD as Consulting Physician (Neurology)  Medical History:  has Hypertension; History of CVA (cerebrovascular accident); [place]; Environmental allergies; Stenosis of right carotid artery; Type 2 diabetes mellitus with circulatory disorder ([place]; Hyperlipidemia associated with type 2 diabetes mellitus ([place]; Acid reflux; Liver lesion, right lobe; Fatty liver; Testosterone deficiency; Fatigue; Poor sleep pattern; Vitamin D deficiency; and Left shoulder tendonitis on Jared Ramsey problem list. Surgical History:  Jared Ramsey  has a past surgical history that includes Tonsillectomy and Plantar's wart excision (Left, 2003). Family History:  Jared Ramsey family history includes Alzheimer's disease in Jared Ramsey maternal grandmother and paternal grandmother; Diabetes in Jared Ramsey maternal grandmother, mother, and sister; Emphysema in Jared Ramsey paternal grandmother; Epilepsy in Jared Ramsey sister; Hyperlipidemia in Jared Ramsey father and paternal grandfather; Hypertension in Jared Ramsey father, maternal grandmother, mother, paternal grandfather, and sister; Lymphoma in Jared Ramsey mother. Social History:   reports that Jared Ramsey quit smoking about 4 years ago. Jared Ramsey smoking use included cigarettes. Jared Ramsey has a 7.50 pack-year smoking history. Jared Ramsey has never used smokeless tobacco. Jared Ramsey reports current alcohol use of about 6.0 standard drinks of alcohol per week. Jared Ramsey reports that Jared Ramsey does not use drugs.  Review of Systems:  Review of Systems  Constitutional:  Negative for malaise/fatigue and weight loss.  HENT:  Negative for hearing loss and tinnitus.   Eyes:  Negative for blurred vision and double vision.  Respiratory:  Negative for cough, shortness of breath and wheezing.   Cardiovascular:  Negative for chest pain, palpitations, orthopnea, claudication and leg swelling.  Gastrointestinal:  Negative for abdominal pain, blood in stool, constipation, diarrhea, heartburn, melena, nausea and vomiting.   Genitourinary: Negative.   Musculoskeletal:  Negative for joint pain and myalgias.  Skin:  Negative for rash.  Neurological:  Negative for dizziness, tingling, sensory change, weakness and headaches.  Endo/Heme/Allergies:  Negative for polydipsia.  Psychiatric/Behavioral:  Negative for depression, memory loss and substance abuse. The patient is not nervous/anxious and does not have insomnia.   All other systems reviewed and are negative.   Physical Exam: Estimated body mass index is 36.23 kg/m as calculated from the following:   Height as of this encounter: _0  (1.854 m).   Weight as of this encounter: 274 lb 9.6 oz (124.6 kg). BP 114/70   Pulse 74   Temp (!) 97.3 F (36.3 C)   Ht _1  (1.854 m)   Wt 274 lb 9.6 oz (124.6 kg)   SpO2 98%   BMI 36.23 kg/m  General Appearance: Well nourished, in no apparent distress.  Eyes: PERRLA, EOMs, conjunctiva no swelling or erythema, Sinuses: No Frontal/maxillary tenderness  ENT/Mouth: Ext aud canals clear, normal light reflex with TMs without erythema, bulging. Good dentition. No erythema, swelling, or exudate on post pharynx. Tonsils not swollen or erythematous. Hearing normal.  Neck: Supple, thyroid normal. No bruits  Respiratory: Respiratory effort normal, BS equal bilaterally without rales, rhonchi, wheezing or stridor.  Cardio: RRR without murmurs, rubs or gallops. Brisk peripheral pulses without edema.  Chest: symmetric, with normal excursions and percussion.  Abdomen: Soft/obese abdomen, non-tender, no guarding, rebound, palpable hernias, masses, or organomegaly.  Lymphatics: Non  tender without lymphadenopathy.  Genitourinary: Declines Musculoskeletal: Full ROM all peripheral extremities,5/5 strength, and normal gait.  Skin: Warm, dry without rashes, lesions, ecchymosis. Neuro: Cranial nerves intact, reflexes equal bilaterally. Normal muscle tone, no cerebellar symptoms. Sensation intact to monofilament.  Psych: Awake and oriented  X 3, normal affect, Insight and Judgment appropriate.    EKG: NSR, no ST changes  Darnell Stimson E  2:45 PM [facility]

## 2022-10-03 ENCOUNTER — Encounter: Payer: Self-pay | Admitting: Nurse Practitioner

## 2022-10-03 ENCOUNTER — Ambulatory Visit (INDEPENDENT_AMBULATORY_CARE_PROVIDER_SITE_OTHER): Payer: No Typology Code available for payment source | Admitting: Nurse Practitioner

## 2022-10-03 VITALS — BP 114/70 | HR 74 | Temp 97.3°F | Ht 73.0 in | Wt 274.6 lb

## 2022-10-03 DIAGNOSIS — Z13220 Encounter for screening for lipoid disorders: Secondary | ICD-10-CM | POA: Diagnosis not present

## 2022-10-03 DIAGNOSIS — E1169 Type 2 diabetes mellitus with other specified complication: Secondary | ICD-10-CM

## 2022-10-03 DIAGNOSIS — Z23 Encounter for immunization: Secondary | ICD-10-CM | POA: Diagnosis not present

## 2022-10-03 DIAGNOSIS — Z136 Encounter for screening for cardiovascular disorders: Secondary | ICD-10-CM | POA: Diagnosis not present

## 2022-10-03 DIAGNOSIS — Z131 Encounter for screening for diabetes mellitus: Secondary | ICD-10-CM

## 2022-10-03 DIAGNOSIS — I10 Essential (primary) hypertension: Secondary | ICD-10-CM | POA: Diagnosis not present

## 2022-10-03 DIAGNOSIS — Z0000 Encounter for general adult medical examination without abnormal findings: Secondary | ICD-10-CM | POA: Diagnosis not present

## 2022-10-03 DIAGNOSIS — Z0001 Encounter for general adult medical examination with abnormal findings: Secondary | ICD-10-CM

## 2022-10-03 DIAGNOSIS — Z8673 Personal history of transient ischemic attack (TIA), and cerebral infarction without residual deficits: Secondary | ICD-10-CM

## 2022-10-03 DIAGNOSIS — I6521 Occlusion and stenosis of right carotid artery: Secondary | ICD-10-CM

## 2022-10-03 DIAGNOSIS — E559 Vitamin D deficiency, unspecified: Secondary | ICD-10-CM

## 2022-10-03 DIAGNOSIS — Z79899 Other long term (current) drug therapy: Secondary | ICD-10-CM

## 2022-10-03 DIAGNOSIS — Z9109 Other allergy status, other than to drugs and biological substances: Secondary | ICD-10-CM

## 2022-10-03 DIAGNOSIS — E1159 Type 2 diabetes mellitus with other circulatory complications: Secondary | ICD-10-CM

## 2022-10-03 DIAGNOSIS — F1010 Alcohol abuse, uncomplicated: Secondary | ICD-10-CM

## 2022-10-03 DIAGNOSIS — K219 Gastro-esophageal reflux disease without esophagitis: Secondary | ICD-10-CM

## 2022-10-03 DIAGNOSIS — Z1389 Encounter for screening for other disorder: Secondary | ICD-10-CM | POA: Diagnosis not present

## 2022-10-03 DIAGNOSIS — Z1329 Encounter for screening for other suspected endocrine disorder: Secondary | ICD-10-CM

## 2022-10-03 MED ORDER — TIRZEPATIDE 15 MG/0.5ML ~~LOC~~ SOAJ: 15.0000 mg | SUBCUTANEOUS | 1 refills | Status: DC

## 2022-10-03 NOTE — Patient Instructions (Signed)

## 2022-10-04 LAB — COMPLETE METABOLIC PANEL WITH GFR
AG Ratio: 1.8 (calc) (ref 1.0–2.5)
ALT: 33 U/L (ref 9–46)
AST: 13 U/L (ref 10–40)
Albumin: 4.6 g/dL (ref 3.6–5.1)
Alkaline phosphatase (APISO): 54 U/L (ref 36–130)
BUN: 12 mg/dL (ref 7–25)
CO2: 28 mmol/L (ref 20–32)
Calcium: 9.8 mg/dL (ref 8.6–10.3)
Chloride: 104 mmol/L (ref 98–110)
Creat: 1.05 mg/dL (ref 0.60–1.26)
Globulin: 2.5 g/dL (calc) (ref 1.9–3.7)
Glucose, Bld: 138 mg/dL — ABNORMAL HIGH (ref 65–99)
Potassium: 4.1 mmol/L (ref 3.5–5.3)
Sodium: 140 mmol/L (ref 135–146)
Total Bilirubin: 1 mg/dL (ref 0.2–1.2)
Total Protein: 7.1 g/dL (ref 6.1–8.1)
eGFR: 94 mL/min/{1.73_m2} (ref >=60)

## 2022-10-04 LAB — CBC WITH DIFFERENTIAL/PLATELET
Absolute Monocytes: 392 cells/uL (ref 200–950)
Basophils Absolute: 30 cells/uL (ref 0–200)
Basophils Relative: 0.4 %
Eosinophils Absolute: 229 cells/uL (ref 15–500)
Eosinophils Relative: 3.1 %
HCT: 43.6 % (ref 38.5–50.0)
Hemoglobin: 15 g/dL (ref 13.2–17.1)
Lymphs Abs: 2287 cells/uL (ref 850–3900)
MCH: 29.8 pg (ref 27.0–33.0)
MCHC: 34.4 g/dL (ref 32.0–36.0)
MCV: 86.5 fL (ref 80.0–100.0)
MPV: 9.2 fL (ref 7.5–12.5)
Monocytes Relative: 5.3 %
Neutro Abs: 4462 cells/uL (ref 1500–7800)
Neutrophils Relative %: 60.3 %
Platelets: 375 10*3/uL (ref 140–400)
RBC: 5.04 10*6/uL (ref 4.20–5.80)
RDW: 13.1 % (ref 11.0–15.0)
Total Lymphocyte: 30.9 %
WBC: 7.4 10*3/uL (ref 3.8–10.8)

## 2022-10-04 LAB — URINALYSIS, ROUTINE W REFLEX MICROSCOPIC
Bilirubin Urine: NEGATIVE
Glucose, UA: NEGATIVE
Hgb urine dipstick: NEGATIVE
Ketones, ur: NEGATIVE
Leukocytes,Ua: NEGATIVE
Nitrite: NEGATIVE
Protein, ur: NEGATIVE
Specific Gravity, Urine: 1.004 (ref 1.001–1.035)
pH: 6.5 (ref 5.0–8.0)

## 2022-10-04 LAB — LIPID PANEL
Cholesterol: 178 mg/dL (ref <200)
HDL: 38 mg/dL — ABNORMAL LOW (ref >=40)
LDL Cholesterol (Calc): 115 mg/dL (calc) — ABNORMAL HIGH
Non-HDL Cholesterol (Calc): 140 mg/dL (calc) — ABNORMAL HIGH (ref <130)
Total CHOL/HDL Ratio: 4.7 (calc) (ref <5.0)
Triglycerides: 133 mg/dL (ref <150)

## 2022-10-04 LAB — MICROALBUMIN / CREATININE URINE RATIO
Creatinine, Urine: 24 mg/dL (ref 20–320)
Microalb, Ur: <0.2 mg/dL

## 2022-10-04 LAB — TSH: TSH: 1.47 mIU/L (ref 0.40–4.50)

## 2022-10-04 LAB — HEMOGLOBIN A1C
Hgb A1c MFr Bld: 5.9 % of total Hgb — ABNORMAL HIGH (ref <5.7)
Mean Plasma Glucose: 123 mg/dL
eAG (mmol/L): 6.8 mmol/L

## 2022-10-04 LAB — VITAMIN D 25 HYDROXY (VIT D DEFICIENCY, FRACTURES): Vit D, 25-Hydroxy: 31 ng/mL (ref 30–100)

## 2022-10-04 LAB — MAGNESIUM: Magnesium: 2.1 mg/dL (ref 1.5–2.5)

## 2022-10-23 ENCOUNTER — Other Ambulatory Visit: Payer: Self-pay

## 2022-10-23 ENCOUNTER — Ambulatory Visit (INDEPENDENT_AMBULATORY_CARE_PROVIDER_SITE_OTHER): Payer: No Typology Code available for payment source | Admitting: Nurse Practitioner

## 2022-10-23 ENCOUNTER — Encounter: Payer: Self-pay | Admitting: Nurse Practitioner

## 2022-10-23 VITALS — HR 87 | Temp 97.9°F

## 2022-10-23 DIAGNOSIS — R0981 Nasal congestion: Secondary | ICD-10-CM | POA: Diagnosis not present

## 2022-10-23 DIAGNOSIS — R051 Acute cough: Secondary | ICD-10-CM | POA: Diagnosis not present

## 2022-10-23 DIAGNOSIS — U071 COVID-19: Secondary | ICD-10-CM

## 2022-10-23 DIAGNOSIS — R519 Headache, unspecified: Secondary | ICD-10-CM | POA: Diagnosis not present

## 2022-10-23 DIAGNOSIS — R6889 Other general symptoms and signs: Secondary | ICD-10-CM

## 2022-10-23 DIAGNOSIS — Z1152 Encounter for screening for COVID-19: Secondary | ICD-10-CM

## 2022-10-23 LAB — POC COVID19 BINAXNOW: SARS Coronavirus 2 Ag: POSITIVE — AB

## 2022-10-23 LAB — POCT INFLUENZA A/B
Influenza A, POC: NEGATIVE
Influenza B, POC: NEGATIVE

## 2022-10-23 MED ORDER — PREDNISONE 10 MG PO TABS: ORAL_TABLET | ORAL | 0 refills | Status: DC

## 2022-10-23 MED ORDER — ALBUTEROL SULFATE HFA 108 (90 BASE) MCG/ACT IN AERS: 2.0000 | INHALATION_SPRAY | Freq: Four times a day (QID) | RESPIRATORY_TRACT | 2 refills | Status: AC | PRN

## 2022-10-23 MED ORDER — PROMETHAZINE-DM 6.25-15 MG/5ML PO SYRP: 5.0000 mL | ORAL_SOLUTION | Freq: Four times a day (QID) | ORAL | 0 refills | Status: DC | PRN

## 2022-10-23 MED ORDER — AZITHROMYCIN 500 MG PO TABS: 500.0000 mg | ORAL_TABLET | Freq: Every day | ORAL | 0 refills | Status: AC

## 2022-10-23 NOTE — Progress Notes (Signed)
THIS ENCOUNTER IS A VIRTUAL VISIT DUE TO COVID-19 - PATIENT WAS NOT SEEN IN THE OFFICE.  PATIENT HAS CONSENTED TO VIRTUAL VISIT / TELEMEDICINE VISIT   Virtual Visit via telephone Note  I connected with  Jared Ramsey on 10/23/2022 by telephone.  I verified that I am speaking with the correct person using two identifiers.    I discussed the limitations of evaluation and management by telemedicine and the availability of in person appointments. The patient expressed understanding and agreed to proceed.  History of Present Illness:  Pulse 87   Temp 97.9 F (36.6 C)   SpO2 99%   37 y.o. patient contacted office reporting URI sx HA, nasal congestion, hot flashes. he tested positive by rapid test in office. OV was conducted by telephone to minimize exposure. This patient has not been vaccinated for covid 19.  Sx began 1 week ago days ago with HA  Treatments tried so far: Dayquil, Nyquil  Exposures: Birthday Party   Medications  Current Outpatient Medications (Endocrine & Metabolic):    tirzepatide (MOUNJARO) 15 MG/0.5ML Pen, Inject 15 mg into the skin once a week.  Current Outpatient Medications (Cardiovascular):    rosuvastatin (CRESTOR) 40 MG tablet, TAKE ONE TABLET BY MOUTH DAILY FOR HIGH CHOLESTEROL      Allergies:  Allergies  Allergen Reactions   Bisoprolol     Numbness, tingling, feels bad   Codeine Swelling    Problem list He has Hypertension; History of CVA (cerebrovascular accident); Morbid obesity ([place]); Environmental allergies; Stenosis of right carotid artery; Type 2 diabetes mellitus with circulatory disorder ([place]); Hyperlipidemia associated with type 2 diabetes mellitus ([place]); Acid reflux; Liver lesion, right lobe; Fatty liver; Testosterone deficiency; Fatigue; Poor sleep pattern; Vitamin D deficiency; and Left shoulder tendonitis on their problem list.   Social History:   reports that he quit smoking about 4 years ago. His smoking use included cigarettes.  He has a 7.50 pack-year smoking history. He has never used smokeless tobacco. He reports current alcohol use of about 6.0 standard drinks of alcohol per week. He reports that he does not use drugs.  Observations/Objective:  General : Well sounding patient in no apparent distress, coughing HEENT: no hoarseness, no cough for duration of visit Lungs: speaks in complete sentences, no audible wheezing, no apparent distress Neurological: alert, oriented x 3 Psychiatric: pleasant, judgement appropriate   Assessment and Plan:  Covid 19 Covid 19 positive per rapid screening test in office rapid test Risk factors include: DM Symptoms are: mild Due to co morbid conditions and risk factors, discussed antivirals Paxlovid however past 5 days of symptom onset. Immue support reviewed  Take tylenol PRN temp 101+ Push hydration Regular ambulation or calf exercises exercises for clot prevention and 81 mg ASA unless contraindicated Sx supportive therapy suggested Follow up via mychart or telephone if needed Advised patient obtain O2 monitor; present to ED if persistently <90% or with severe dyspnea, CP, fever uncontrolled by tylenol, confusion, sudden decline       Should remain in isolation 5 days from testing positive and then wear a mask when around other people for the following 5 days  1. Flu-like symptoms Negative  - POCT Influenza A/B  2. Encounter for screening for COVID-19 Positive  - POC COVID-19  3. COVID-19  - albuterol (VENTOLIN HFA) 108 (90 Base) MCG/ACT inhaler; Inhale 2 puffs into the lungs every 6 (six) hours as needed for wheezing or shortness of breath.  Dispense: 8 g; Refill: 2 - azithromycin (  ZITHROMAX) 500 MG tablet; Take 1 tablet (500 mg total) by mouth daily for 10 days.  Dispense: 10 tablet; Refill: 0 - promethazine-dextromethorphan (PROMETHAZINE-DM) 6.25-15 MG/5ML syrup; Take 5 mLs by mouth 4 (four) times daily as needed for cough.  Dispense: 240 mL; Refill: 0 -  predniSONE (DELTASONE) 10 MG tablet; 1 tab 3 x day for 2 days, then 1 tab 2 x day for 2 days, then 1 tab 1 x day for 3 days  Dispense: 13 tablet; Refill: 0  4.  Acute cough Start Promethazine cough syrup  5. Nasal congestion Start Mucinex  6. Acute nonintractable headache, unspecified headache type May take up to Extra Strength Tylenol up to QID.  Follow Up Instructions:  I discussed the assessment and treatment plan with the patient. The patient was provided an opportunity to ask questions and all were answered. The patient agreed with the plan and demonstrated an understanding of the instructions.   The patient was advised to call back or seek an in-person evaluation if the symptoms worsen or if the condition fails to improve as anticipated.  I provided 15 minutes of non-face-to-face time during this encounter.   Darrol Jump, NP

## 2023-01-08 ENCOUNTER — Ambulatory Visit: Payer: No Typology Code available for payment source | Admitting: Nurse Practitioner

## 2023-01-22 ENCOUNTER — Encounter: Payer: Self-pay | Admitting: Nurse Practitioner

## 2023-01-22 ENCOUNTER — Ambulatory Visit: Payer: No Typology Code available for payment source | Admitting: Nurse Practitioner

## 2023-01-22 VITALS — BP 116/72 | HR 100 | Temp 98.0°F | Resp 16 | Ht 73.0 in | Wt 265.0 lb

## 2023-01-22 DIAGNOSIS — E785 Hyperlipidemia, unspecified: Secondary | ICD-10-CM

## 2023-01-22 DIAGNOSIS — E1169 Type 2 diabetes mellitus with other specified complication: Secondary | ICD-10-CM

## 2023-01-22 DIAGNOSIS — T148XXA Other injury of unspecified body region, initial encounter: Secondary | ICD-10-CM

## 2023-01-22 DIAGNOSIS — I10 Essential (primary) hypertension: Secondary | ICD-10-CM

## 2023-01-22 DIAGNOSIS — Z79899 Other long term (current) drug therapy: Secondary | ICD-10-CM

## 2023-01-22 DIAGNOSIS — E1159 Type 2 diabetes mellitus with other circulatory complications: Secondary | ICD-10-CM

## 2023-01-22 MED ORDER — MELOXICAM 7.5 MG PO TABS: 7.5000 mg | ORAL_TABLET | Freq: Every day | ORAL | 0 refills | Status: DC

## 2023-01-22 NOTE — Progress Notes (Signed)
FOLLOW UP  Assessment and Plan:   Type 2 diabetes mellitus with other circulatory complication, without long-term current use of insulin (HCC) Discussed with concurrent weight loss to continue monitoring BG.  He may need to wean from Metformin and Jardiance while continuing Mounjaro. Education: Reviewed 'ABCs' of diabetes management  Discussed goals to be met and/or maintained include A1C (<7) Blood pressure (<130/80) Cholesterol (LDL <70) Continue Eye Exam yearly  Continue Dental Exam Q6 mo Discussed dietary recommendations Discussed Physical Activity recommendations Foot exam UTD Check A1C  Morbid obesity (HCC) Trending down. Continue medications. Continue lifestyle modifications.   Hypertension, unspecified type Continue Lisinopril 10 mg QD.  If BP remains >100/90 will need to stop BP medications completely. Discussed DASH (Dietary Approaches to Stop Hypertension) DASH diet is lower in sodium than a typical American diet. Cut back on foods that are high in saturated fat, cholesterol, and trans fats. Eat more whole-grain foods, fish, poultry, and nuts Remain active and exercise as tolerated daily.  Monitor BP at home-Call if greater than 130/80.  Check CMP/CBC  Hyperlipidemia associated with type 2 diabetes mellitus (HCC) Controlled. Discussed lifestyle modifications. Recommended diet heavy in fruits and veggies, omega 3's. Decrease consumption of animal meats, cheeses, and dairy products. Remain active and exercise as tolerated. Continue to monitor. Check lipids/TSH  Medication management All medications discussed and reviewed in full. All questions and concerns regarding medications addressed.    Muscle strain Start low dose muscle relaxer RICE method Alternate Ice/Heat Continue to monitor  No orders of the defined types were placed in this encounter.  Review of lab work is stable - will obtain next OV.    Notify office for further evaluation and  treatment, questions or concerns if any reported s/s fail to improve.   The patient was advised to call back or seek an in-person evaluation if any symptoms worsen or if the condition fails to improve as anticipated.   Further disposition pending results of labs. Discussed med's effects and SE's.    I discussed the assessment and treatment plan with the patient. The patient was provided an opportunity to ask questions and all were answered. The patient agreed with the plan and demonstrated an understanding of the instructions.  Discussed med's effects and SE's. Screening labs and tests as requested with regular follow-up as recommended.  I provided 25 minutes of face-to-face time during this encounter including counseling, chart review, and critical decision making was preformed.  Today's Plan of Care is based on a patient-centered health care approach known as shared decision making - the decisions, tests and treatments allow for patient preferences and values to be balanced with clinical evidence.     Future Appointments  Date Time Provider Department Center  10/07/2023  3:00 PM Raynelle Dick, NP [facility] None    ----------------------------------------------------------------------------------------------------------------------  HPI 37 y.o. male  presents for 3 month follow up on hypertension, cholesterol, diabetes, weight and vitamin D deficiency.   Overall he reports feeling well today.  He does share a recent injury, pulled back muscle from working out and lifting his children.  Unable to bend forward without pain.  Interrupted sleep d/t pain. Has tried OTC analgesic without relief.  He is continuing Mounjaro successfully for weight loss and A1C control.  He is continuing to lose weight.   He is no longer experiencing fatigue, low libido or less motivation.  This has sense resolved.     BMI is Body mass index is 34.96 kg/m., Wt Readings from Last  3 Encounters:  01/22/23  265 lb (120.2 kg)  10/03/22 274 lb 9.6 oz (124.6 kg)  05/21/22 271 lb (122.9 kg)   He has decreased his BP medication Lisinopril to 10 mg daily.  Continues to be mostly hypotensive.  BP well controlled in clinic today.  He continues to monitor BG with fasting average around 115-110.  He is continuing Metformin and Jardiance along with Mounjaro.  BMI is Body mass index is 34.96 kg/m., he has been working on diet and exercise. Wt Readings from Last 3 Encounters:  01/22/23 265 lb (120.2 kg)  10/03/22 274 lb 9.6 oz (124.6 kg)  05/21/22 271 lb (122.9 kg)    His blood pressure has been controlled at home, today their BP is BP: 116/72  He does workout. He denies chest pain, shortness of breath, dizziness.   He is on cholesterol medication Rosuvastatin and denies myalgias. His cholesterol is trending towards goal. The cholesterol last visit was:   Lab Results  Component Value Date   CHOL 178 10/03/2022   HDL 38 (L) 10/03/2022   LDLCALC 115 (H) 10/03/2022   TRIG 133 10/03/2022   CHOLHDL 4.7 10/03/2022    He has been working on diet and exercise for prediabetes, and denies hyperglycemia, hypoglycemia , paresthesia of the feet, polydipsia, and polyuria. Last A1C in the office was:  Lab Results  Component Value Date   HGBA1C 5.9 (H) 10/03/2022   Patient is on Vitamin D supplement.   Lab Results  Component Value Date   VD25OH 31 10/03/2022        Current Medications:  Current Outpatient Medications on File Prior to Visit  Medication Sig   albuterol (VENTOLIN HFA) 108 (90 Base) MCG/ACT inhaler Inhale 2 puffs into the lungs every 6 (six) hours as needed for wheezing or shortness of breath.   predniSONE (DELTASONE) 10 MG tablet 1 tab 3 x day for 2 days, then 1 tab 2 x day for 2 days, then 1 tab 1 x day for 3 days   promethazine-dextromethorphan (PROMETHAZINE-DM) 6.25-15 MG/5ML syrup Take 5 mLs by mouth 4 (four) times daily as needed for cough.   rosuvastatin (CRESTOR) 40 MG tablet TAKE  ONE TABLET BY MOUTH DAILY FOR HIGH CHOLESTEROL   tirzepatide (MOUNJARO) 15 MG/0.5ML Pen Inject 15 mg into the skin once a week.   No current facility-administered medications on file prior to visit.     Allergies:  Allergies  Allergen Reactions   Bisoprolol     Numbness, tingling, feels bad   Codeine Swelling     Medical History:  Past Medical History:  Diagnosis Date   Alcohol consumption binge drinking 07/17/2018   12-24 beers on the weekends   Diabetes mellitus without complication    Former smoker (quit 07/2018, 7.5 pack year history) 07/17/2018   History of COVID-19 11/15/2019   Hypertension    Morbid obesity    Stroke    "mini stroke"   Family history- Reviewed and unchanged Social history- Reviewed and unchanged   Review of Systems: All review systems reviewed and negative except for pertinent positives in history of present illness.   Physical Exam: BP 116/72   Pulse 100   Temp 98 F (36.7 C)   Resp 16   Ht 6\' 1"  (1.854 m)   Wt 265 lb (120.2 kg)   SpO2 97%   BMI 34.96 kg/m  Wt Readings from Last 3 Encounters:  01/22/23 265 lb (120.2 kg)  10/03/22 274 lb 9.6 oz (124.6 kg)  05/21/22 271 lb (122.9 kg)   General Appearance: Well nourished, in no apparent distress. Eyes: PERRLA, EOMs, conjunctiva no swelling or erythema Sinuses: No Frontal/maxillary tenderness ENT/Mouth: Ext aud canals clear, TMs without erythema, bulging. No erythema, swelling, or exudate on post pharynx.  Tonsils not swollen or erythematous. Hearing normal.  Neck: Supple, thyroid normal.  Respiratory: Respiratory effort normal, BS equal bilaterally without rales, rhonchi, wheezing or stridor.  Cardio: RRR with no MRGs. Brisk peripheral pulses without edema.  Abdomen: Soft, + BS.  Non tender, no guarding, rebound, hernias, masses. Lymphatics: Non tender without lymphadenopathy.  Musculoskeletal: Full ROM, 5/5 strength, Normal gait Skin: Warm, dry without rashes, lesions, ecchymosis.   Neuro: Cranial nerves intact. No cerebellar symptoms.  Psych: Awake and oriented X 3, normal affect, Insight and Judgment appropriate.    Adela Glimpse, NP 4:04 PM [facility]

## 2023-01-30 NOTE — Patient Instructions (Signed)
Muscle Strain A muscle strain, or pulled muscle, happens when a muscle is stretched beyond its normal length. This can tear some muscle fibers and cause pain. Usually, it takes 1-2 weeks to heal from a muscle strain. Full healing normally takes 5-6 weeks. What are the causes? This condition is caused when a sudden force is placed on a muscle and stretches it too far. This can happen with a fall, while lifting, or during sports. What increases the risk? You are more likely to develop a muscle strain if you are an athlete or you do a lot of physical activity. What are the signs or symptoms? Pain. Tenderness. Bruising. Swelling. Trouble using the muscle. How is this treated? This condition is first treated with PRICE therapy. This involves: Protecting your muscle from being injured again. Resting your injured muscle. Icing your injured muscle. Putting pressure (compression) on your injured muscle. This may be done with a splint or elastic bandage. Raising (elevating) your injured muscle. Your doctor may also recommend medicine for pain. Follow these instructions at home: If you have a splint that can be taken off: Wear the splint as told by your doctor. Take it off only as told by your doctor. Check the skin around the splint every day. Tell your doctor if you see problems. Loosen the splint if your fingers or toes: Tingle. Become numb. Turn cold and blue. Keep the splint clean. If the splint is not waterproof: Do not let it get wet. Cover it with a watertight covering when you take a bath or a shower. Managing pain, stiffness, and swelling  If told, put ice on your injured area. To do this: If you have a removable splint, take it off as told by your doctor. Put ice in a plastic bag. Place a towel between your skin and the bag. Leave the ice on for 20 minutes, 2-3 times a day. Take off the ice if your skin turns bright red. This is very important. If you cannot feel pain, heat,  or cold, you have a greater risk of damage to the area. Move your fingers or toes often. Raise the injured area above the level of your heart while you are sitting or lying down. Wear an elastic bandage as told by your doctor. Make sure it is not too tight. General instructions Take over-the-counter and prescription medicines only as told by your doctor. This may include: Medicines for pain and swelling that are taken by mouth or put on the skin. Medicines to help relax your muscles. Limit your activity. Rest your injured muscle as told by your doctor. Your doctor may say that gentle movements are okay. If physical therapy was prescribed, do exercises as told by your doctor. Do not put pressure on any part of the splint until it is fully hardened. This may take many hours. Do not smoke or use any products that contain nicotine or tobacco. If you need help quitting, ask your doctor. Ask your doctor when it is safe to drive if you have a splint. Keep all follow-up visits. How is this prevented? Warm up before you exercise. This helps to prevent more muscle strains. Contact a doctor if: You have more pain or swelling in the injured area. Get help right away if: You have any of these problems in your injured area: Numbness. Tingling. Less strength than normal. Summary A muscle strain is an injury that happens when a muscle is stretched beyond normal length. This condition is first treated with PRICE   therapy. This includes protecting, resting, icing, adding pressure, and raising your injury. Limit your activity. Rest your injured muscle as told by your doctor. Your doctor may say that gentle movements are okay. Warm up before you exercise. This helps to prevent more muscle strains. This information is not intended to replace advice given to you by your health care provider. Make sure you discuss any questions you have with your health care provider. Document Revised: 12/11/2020 Document  Reviewed: 12/11/2020 [employer] Patient Education  2023 [employer]  

## 2023-02-25 ENCOUNTER — Ambulatory Visit: Payer: Self-pay

## 2023-02-25 ENCOUNTER — Other Ambulatory Visit: Payer: Self-pay | Admitting: Family Medicine

## 2023-02-25 DIAGNOSIS — S43401A Unspecified sprain of right shoulder joint, initial encounter: Secondary | ICD-10-CM

## 2023-03-24 ENCOUNTER — Encounter: Payer: Self-pay | Admitting: Nurse Practitioner

## 2023-03-24 DIAGNOSIS — T148XXA Other injury of unspecified body region, initial encounter: Secondary | ICD-10-CM

## 2023-03-25 MED ORDER — MELOXICAM 7.5 MG PO TABS: 7.5000 mg | ORAL_TABLET | Freq: Every day | ORAL | 0 refills | Status: AC

## 2023-04-11 ENCOUNTER — Other Ambulatory Visit: Payer: Self-pay

## 2023-04-11 ENCOUNTER — Emergency Department (HOSPITAL_COMMUNITY): Payer: PRIVATE HEALTH INSURANCE

## 2023-04-11 ENCOUNTER — Emergency Department (HOSPITAL_COMMUNITY)
Admission: EM | Admit: 2023-04-11 | Discharge: 2023-04-11 | Disposition: A | Discharge status: Home / Self Care | Payer: PRIVATE HEALTH INSURANCE | Attending: Emergency Medicine | Admitting: Emergency Medicine

## 2023-04-11 ENCOUNTER — Encounter (HOSPITAL_COMMUNITY): Payer: Self-pay

## 2023-04-11 DIAGNOSIS — R4182 Altered mental status, unspecified: Secondary | ICD-10-CM | POA: Insufficient documentation

## 2023-04-11 DIAGNOSIS — S30861A Insect bite (nonvenomous) of abdominal wall, initial encounter: Secondary | ICD-10-CM | POA: Insufficient documentation

## 2023-04-11 DIAGNOSIS — R519 Headache, unspecified: Secondary | ICD-10-CM | POA: Insufficient documentation

## 2023-04-11 DIAGNOSIS — Z79899 Other long term (current) drug therapy: Secondary | ICD-10-CM | POA: Diagnosis not present

## 2023-04-11 DIAGNOSIS — R930 Abnormal findings on diagnostic imaging of skull and head, not elsewhere classified: Secondary | ICD-10-CM | POA: Insufficient documentation

## 2023-04-11 DIAGNOSIS — W57XXXA Bitten or stung by nonvenomous insect and other nonvenomous arthropods, initial encounter: Secondary | ICD-10-CM | POA: Insufficient documentation

## 2023-04-11 DIAGNOSIS — G939 Disorder of brain, unspecified: Secondary | ICD-10-CM | POA: Diagnosis not present

## 2023-04-11 DIAGNOSIS — S3991XA Unspecified injury of abdomen, initial encounter: Secondary | ICD-10-CM | POA: Diagnosis present

## 2023-04-11 DIAGNOSIS — E119 Type 2 diabetes mellitus without complications: Secondary | ICD-10-CM | POA: Diagnosis not present

## 2023-04-11 LAB — BASIC METABOLIC PANEL
Anion gap: 9 (ref 5–15)
BUN: 16 mg/dL (ref 6–20)
CO2: 21 mmol/L — ABNORMAL LOW (ref 22–32)
Calcium: 9.3 mg/dL (ref 8.9–10.3)
Chloride: 108 mmol/L (ref 98–111)
Creatinine, Ser: 0.96 mg/dL (ref 0.61–1.24)
GFR, Estimated: >60 mL/min (ref >60)
Glucose, Bld: 97 mg/dL (ref 70–99)
Potassium: 3.9 mmol/L (ref 3.5–5.1)
Sodium: 138 mmol/L (ref 135–145)

## 2023-04-11 LAB — I-STAT VENOUS BLOOD GAS, ED
Acid-base deficit: 1 mmol/L (ref 0.0–2.0)
Bicarbonate: 22.5 mmol/L (ref 20.0–28.0)
Calcium, Ion: 1.22 mmol/L (ref 1.15–1.40)
HCT: 41 % (ref 39.0–52.0)
Hemoglobin: 13.9 g/dL (ref 13.0–17.0)
O2 Saturation: 99 %
Potassium: 3.8 mmol/L (ref 3.5–5.1)
Sodium: 141 mmol/L (ref 135–145)
TCO2: 24 mmol/L (ref 22–32)
pCO2, Ven: 34.3 mmHg — ABNORMAL LOW (ref 44–60)
pH, Ven: 7.425 (ref 7.25–7.43)
pO2, Ven: 147 mmHg — ABNORMAL HIGH (ref 32–45)

## 2023-04-11 LAB — URINALYSIS, ROUTINE W REFLEX MICROSCOPIC
Bilirubin Urine: NEGATIVE
Glucose, UA: NEGATIVE mg/dL
Hgb urine dipstick: NEGATIVE
Ketones, ur: NEGATIVE mg/dL
Leukocytes,Ua: NEGATIVE
Nitrite: NEGATIVE
Protein, ur: NEGATIVE mg/dL
Specific Gravity, Urine: >1.03 — ABNORMAL HIGH (ref 1.005–1.030)
pH: 6 (ref 5.0–8.0)

## 2023-04-11 LAB — CBC WITH DIFFERENTIAL/PLATELET
Abs Immature Granulocytes: 0.01 10*3/uL (ref 0.00–0.07)
Basophils Absolute: 0.1 10*3/uL (ref 0.0–0.1)
Basophils Relative: 1 %
Eosinophils Absolute: 0.2 10*3/uL (ref 0.0–0.5)
Eosinophils Relative: 3 %
HCT: 43.2 % (ref 39.0–52.0)
Hemoglobin: 14.4 g/dL (ref 13.0–17.0)
Immature Granulocytes: 0 %
Lymphocytes Relative: 34 %
Lymphs Abs: 2 10*3/uL (ref 0.7–4.0)
MCH: 28.5 pg (ref 26.0–34.0)
MCHC: 33.3 g/dL (ref 30.0–36.0)
MCV: 85.5 fL (ref 80.0–100.0)
Monocytes Absolute: 0.5 10*3/uL (ref 0.1–1.0)
Monocytes Relative: 9 %
Neutro Abs: 3.1 10*3/uL (ref 1.7–7.7)
Neutrophils Relative %: 53 %
Platelets: 266 10*3/uL (ref 150–400)
RBC: 5.05 MIL/uL (ref 4.22–5.81)
RDW: 12.7 % (ref 11.5–15.5)
WBC: 5.8 10*3/uL (ref 4.0–10.5)
nRBC: 0 % (ref 0.0–0.2)

## 2023-04-11 LAB — TROPONIN I (HIGH SENSITIVITY): Troponin I (High Sensitivity): <2 ng/L (ref <18)

## 2023-04-11 LAB — CBG MONITORING, ED: Glucose-Capillary: 94 mg/dL (ref 70–99)

## 2023-04-11 NOTE — ED Notes (Signed)
Pt ambulated to restroom without incident.

## 2023-04-11 NOTE — ED Notes (Signed)
Patient transported to X-ray 

## 2023-04-11 NOTE — Discharge Instructions (Addendum)
Thank you for letting us take care of you today.  Your labs were normal. Your CT scan did not show a stroke. Your MRI scan also did not show a new stroke but we do still see similar lesions as on your previous imaging. With your symptoms, I recommend following up in the clinic with neurology for further evaluation of this. You may follow up with your previous neurologist or I have referred you to neurology for follow up if needed.  For new or worsening symptoms, return to nearest ED for re-evaluation.

## 2023-04-11 NOTE — ED Notes (Signed)
Checked patient cbg it was it was 3 notified RN of blood sugar

## 2023-04-11 NOTE — ED Notes (Signed)
Patient transported to CT 

## 2023-04-11 NOTE — ED Provider Notes (Signed)
[place] [facility] DEPARTMENT AT [facility] Provider Note   CSN: [id number] Arrival date & time: 04/11/23  1059     History   Jared PEREL is a 37 y.o. male with PMH DM, HTN who presents to ED c/o not feeling himself for the last week.  He states that he has had episodes of cognitive slowing which are unusual for him.  First episode was 2 days ago while playing a game on his phone.  He noted his glucose was around 71 at that time and had something to eat with improvement in symptoms.  He was playing cards with his father this morning when he had another similar episode with bilateral blurred vision that did not resolve with PO intake. History of type 2 diabetes diagnosed about 1 year ago.  Patient is on Mounjaro for this but states that his blood sugars have been generally well-controlled so he does not take them regularly anymore.  He denies chest pain, shortness of breath, nausea, vomiting, diarrhea, abdominal pain.  States that he feels like it is difficult to focus and he also has a slight frontal headache as well as some pressure on both sides of his neck.  No recent fever.  No recent cough or congestion.  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 No history of clots. No leg pain or swelling. Notes previous CVA/TIA with vague symptoms though not the same symptoms as today. States he is going on vacation for a week and wanted to be checked out before leaving to make sure that he is okay. Also notes he had tick bite ~1 month ago. No symptoms following it. No prophylaxis for Lyme disease taken.       Home Medications Prior to Admission medications   Medication Sig Start Date End Date Taking? Authorizing Provider  albuterol (VENTOLIN HFA) 108 (90 Base) MCG/ACT inhaler Inhale 2 puffs into the lungs every 6 (six) hours as needed for wheezing or shortness of breath. 10/23/22   Adela Glimpse, NP  meloxicam (MOBIC) 7.5 MG tablet Take 1 tablet (7.5 mg total) by  mouth daily. 03/25/23 04/24/23  Adela Glimpse, NP  predniSONE (DELTASONE) 10 MG tablet 1 tab 3 x day for 2 days, then 1 tab 2 x day for 2 days, then 1 tab 1 x day for 3 days 10/23/22   Adela Glimpse, NP  promethazine-dextromethorphan (PROMETHAZINE-DM) 6.25-15 MG/5ML syrup Take 5 mLs by mouth 4 (four) times daily as needed for cough. 10/23/22   Adela Glimpse, NP  rosuvastatin (CRESTOR) 40 MG tablet TAKE ONE TABLET BY MOUTH DAILY FOR HIGH CHOLESTEROL 05/27/22   Raynelle Dick, NP  tirzepatide [facility]) 15 MG/0.5ML Pen Inject 15 mg into the skin once a week. 10/03/22   Raynelle Dick, NP      Allergies    Bisoprolol and Codeine    Review of Systems   Review of Systems  All other systems reviewed and are negative.   Physical Exam Updated Vital Signs BP 132/77   Pulse 72   Temp 98.1 F (36.7 C) (Oral)   Resp 16   Ht 6\' 1"  (1.854 m)   Wt 111.1 kg   SpO2 99%   BMI 32.32 kg/m  Physical Exam Vitals and nursing note reviewed.  Constitutional:      General: He is not in acute distress.    Appearance: Normal appearance. He is not ill-appearing or toxic-appearing.  HENT:     Head: Normocephalic and  atraumatic.     Mouth/Throat:     Mouth: Mucous membranes are moist.  Eyes:     General: No visual field deficit.    Conjunctiva/sclera: Conjunctivae normal.  Neck:     Comments: No meningismus, mild sternocleidomastoid tenderness bilaterally Cardiovascular:     Rate and Rhythm: Normal rate and regular rhythm.     Heart sounds: No murmur heard. Pulmonary:     Effort: Pulmonary effort is normal. No respiratory distress.     Breath sounds: Normal breath sounds. No stridor. No wheezing, rhonchi or rales.  Abdominal:     General: Abdomen is flat. There is no distension.     Palpations: Abdomen is soft.     Tenderness: There is no abdominal tenderness. There is no right CVA tenderness, left CVA tenderness, guarding or rebound.  Musculoskeletal:        General: Normal range of  motion.     Cervical back: Normal range of motion and neck supple. No rigidity.     Right lower leg: No edema.     Left lower leg: No edema.     Comments: No midline CTL spinal tenderness, step-offs, or deformities, moving all 4 extremities equally and spontaneously, no calf tenderness bilaterally, negative Homan's sign  Skin:    General: Skin is warm and dry.     Capillary Refill: Capillary refill takes less than 2 seconds.     Coloration: Skin is not jaundiced or pale.     Findings: No rash.     Comments: Small papule to right abdomen, no bull's eye, no surrounding erythema or drainage  Neurological:     Mental Status: He is alert and oriented to person, place, and time.     GCS: GCS eye subscore is 4. GCS verbal subscore is 5. GCS motor subscore is 6.     Cranial Nerves: Cranial nerves 2-12 are intact. No cranial nerve deficit, dysarthria or facial asymmetry.     Sensory: Sensation is intact.     Motor: Motor function is intact. No weakness, tremor, atrophy, abnormal muscle tone or seizure activity.     Coordination: Coordination is intact.  Psychiatric:        Speech: Speech normal.        Behavior: Behavior normal. Behavior is cooperative.        Judgment: Judgment normal.     ED Results / Procedures / Treatments   Labs (all labs ordered are listed, but only abnormal results are displayed) Labs Reviewed  BASIC METABOLIC PANEL - Abnormal; Notable for the following components:      Result Value   CO2 21 (*)    All other components within normal limits  URINALYSIS, ROUTINE W REFLEX MICROSCOPIC - Abnormal; Notable for the following components:   Specific Gravity, Urine >1.030 (*)    All other components within normal limits  I-STAT VENOUS BLOOD GAS, ED - Abnormal; Notable for the following components:   pCO2, Ven 34.3 (*)    pO2, Ven 147 (*)    All other components within normal limits  CBC WITH DIFFERENTIAL/PLATELET  LYME DISEASE DNA BY PCR(BORRELIA BURG)  SPOTTED FEVER  GROUP ANTIBODIES  CBG MONITORING, ED  TROPONIN I (HIGH SENSITIVITY)    EKG EKG Interpretation Date/Time:  Friday April 11 2023 11:10:08 EDT Ventricular Rate:  68 PR Interval:  172 QRS Duration:  99 QT Interval:  386 QTC Calculation: 411 R Axis:   59  Text Interpretation: Sinus rhythm Abnormal R-wave progression, early transition No significant  change since last tracing , rate has decreased Confirmed by Alvira Monday ([id number]) on 04/11/2023 11:16:38 AM  Radiology MR BRAIN WO CONTRAST  Result Date: 04/11/2023 CLINICAL DATA:  Mental status change, unknown cause. EXAM: MRI HEAD WITHOUT CONTRAST TECHNIQUE: Multiplanar, multiecho pulse sequences of the brain and surrounding structures were obtained without intravenous contrast. COMPARISON:  MRI of the brain July 15, 1999.; Head CT July 5 20. FINDINGS: Brain: No acute infarction, hemorrhage, hydrocephalus or extra-axial collection. Small cluster of T1 hypointense/T2 hyperintense nodular lesions in the superior aspect of the right cerebellar hemisphere measuring approximately 12 mm combined, unchanged since 2017. Vascular: Normal flow voids. Skull and upper cervical spine: Normal marrow signal. Sinuses/Orbits: Trace mucosal thickening of the paranasal sinuses. The orbits are maintained. Other: None. IMPRESSION: 1. No acute intracranial abnormality. 2. 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 Electronically Signed   By: Baldemar Lenis M.D.   On: 04/11/2023 15:06   CT Head Wo Contrast  Result Date: 04/11/2023 CLINICAL DATA:  Headache EXAM: CT HEAD WITHOUT CONTRAST TECHNIQUE: Contiguous axial images were obtained from the base of the skull through the vertex without intravenous contrast. RADIATION DOSE REDUCTION: This exam was performed according to the departmental  dose-optimization program which includes automated exposure control, adjustment of the mA and/or kV according to patient size and/or use of iterative reconstruction technique. COMPARISON:  07/14/2018 FINDINGS: Brain: No evidence of acute infarction, hemorrhage, hydrocephalus, extra-axial collection or mass lesion/mass effect. Vascular: No hyperdense vessel or unexpected calcification. Skull: Normal. Negative for fracture or focal lesion. Sinuses/Orbits: No acute finding. Other: None. IMPRESSION: No acute intracranial pathology. Electronically Signed   By: Jearld Lesch M.D.   On: 04/11/2023 13:21   DG Chest 2 View  Result Date: 04/11/2023 CLINICAL DATA:  AMS EXAM: CHEST - 2 VIEW COMPARISON:  None Available. FINDINGS: The heart size and mediastinal contours are within normal limits. Both lungs are clear. The visualized skeletal structures are unremarkable. IMPRESSION: No focal airspace opacity. Electronically Signed   By: Lorenza Cambridge M.D.   On: 04/11/2023 12:45    Procedures Procedures    Medications Ordered in ED Medications - No data to display  ED Course/ Medical Decision Making/ A&P Clinical Course as of 04/11/23 1536  Fri Apr 11, 2023  1340 Discussed findings thus far with pt and father at bedside. No acute finding on CT brain. Workup largely unremarkable. Discussed with attending physician and we will order MRI for further assessment. Discussed this recommendation with pt. He is uncertain if he wants to proceed given otherwise negative workup. No focal deficits on re-exam. MRI ordered, pt to decide if he wants to proceed. [MG]  1525 Updated on MRI findings and plan. MRI with no acute findings. Cerebellar lesions similar to previous which appear chronic. Will refer to neurology for further recommendations.  [MG]    Clinical Course User Index [MG] Tonette Lederer, PA-C                             Medical Decision Making Amount and/or Complexity of Data Reviewed Labs: ordered.  Decision-making details documented in ED Course. Radiology: ordered.   Medical Decision Making:   JONATHN SANTOLI is a 37 y.o. male who presented to the ED today with AMS detailed above.    Additional history discussed with  patient's family/caregivers.  Patient's presentation is complicated by their history of DM, HLD.  Complete initial physical exam performed, notably the patient was neurologically intact. Abdomen soft and nontender. No meningismus. LCTA. No calf tenderness.    Reviewed and confirmed nursing documentation for past medical history, family history, social history.    Initial Assessment:   With the patient's presentation, differential diagnosis includes but is not limited to the differential diagnosis for AMS is extensive and includes, but is not limited to: drug overdose - opioids, alcohol, sedatives, antipsychotics, drug withdrawal, others; Metabolic: hypoxia, hypoglycemia, hyperglycemia, hypercalcemia, hypernatremia, hyponatremia, uremia, hepatic encephalopathy, hypothyroidism, hyperthyroidism, vitamin B12 or thiamine deficiency, carbon monoxide poisoning, Wilson's disease, Lactic acidosis, DKA/HHOS; Infectious: meningitis, encephalitis, bacteremia/sepsis, urinary tract infection, pneumonia, neurosyphilis; Structural: Space-occupying lesion, (brain tumor, subdural hematoma, hydrocephalus,); Vascular: stroke, subarachnoid hemorrhage, coronary ischemia, hypertensive encephalopathy, CNS vasculitis, thrombotic thrombocytopenic purpura, disseminated intravascular coagulation, hyperviscosity; Psychiatric: Schizophrenia, depression; Other: Seizure, hypothermia, heat stroke, dementia  This is most consistent with an acute complicated illness  Initial Plan:  Screening labs including CBC and Metabolic panel to evaluate for infectious or metabolic etiology of disease.  Urinalysis with reflex culture ordered to evaluate for UTI or relevant urologic/nephrologic pathology.  CXR to evaluate for  structural/infectious intrathoracic pathology.  EKG and troponin to evaluate for cardiac pathology VBG to assess for pH disturbance Lyme testing Objective evaluation as below reviewed   Initial Study Results:   Laboratory  All laboratory results reviewed without evidence of clinically relevant pathology.    EKG EKG was reviewed independently. Sinus rhythm. No acute ST-T changes. No STEMI.   Radiology:  All images reviewed independently. Agree with radiology report at this time.   MR BRAIN WO CONTRAST  Result Date: 04/11/2023 CLINICAL DATA:  Mental status change, unknown cause. EXAM: MRI HEAD WITHOUT CONTRAST TECHNIQUE: Multiplanar, multiecho pulse sequences of the brain and surrounding structures were obtained without intravenous contrast. COMPARISON:  MRI of the brain July 15, 1999.; Head CT July 5 20. FINDINGS: Brain: No acute infarction, hemorrhage, hydrocephalus or extra-axial collection. Small cluster of T1 hypointense/T2 hyperintense nodular lesions in the superior aspect of the right cerebellar hemisphere measuring approximately 12 mm combined, unchanged since 2017. Vascular: Normal flow voids. Skull and upper cervical spine: Normal marrow signal. Sinuses/Orbits: Trace mucosal thickening of the paranasal sinuses. The orbits are maintained. Other: None. IMPRESSION: 1. No acute intracranial abnormality. 2. 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 Electronically Signed   By: Baldemar Lenis M.D.   On: 04/11/2023 15:06   CT Head Wo Contrast  Result Date: 04/11/2023 CLINICAL DATA:  Headache EXAM: CT HEAD WITHOUT CONTRAST TECHNIQUE: Contiguous axial images were obtained from the base of the skull through the vertex without intravenous contrast. RADIATION DOSE REDUCTION: This exam was performed according to the  departmental dose-optimization program which includes automated exposure control, adjustment of the mA and/or kV according to patient size and/or use of iterative reconstruction technique. COMPARISON:  07/14/2018 FINDINGS: Brain: No evidence of acute infarction, hemorrhage, hydrocephalus, extra-axial collection or mass lesion/mass effect. Vascular: No hyperdense vessel or unexpected calcification. Skull: Normal. Negative for fracture or focal lesion. Sinuses/Orbits: No acute finding. Other: None. IMPRESSION: No acute intracranial pathology. Electronically Signed   By: Jearld Lesch M.D.   On: 04/11/2023 13:21   DG Chest 2 View  Result Date: 04/11/2023 CLINICAL DATA:  AMS EXAM: CHEST -  2 VIEW COMPARISON:  None Available. FINDINGS: The heart size and mediastinal contours are within normal limits. Both lungs are clear. The visualized skeletal structures are unremarkable. IMPRESSION: No focal airspace opacity. Electronically Signed   By: Lorenza Cambridge M.D.   On: 04/11/2023 12:45     Final Assessment and Plan:   37 year old male presents to the ED complaining of altered mental status.  He states that he feels cognitively delayed compared to his baseline.  He is alert and oriented, answering questions appropriately, no focal deficits identified on exam.  History of previous CVA without residual deficits.  Workup initiated as above for further assessment.  CT brain with no acute findings.  Lab work largely unremarkable.  Discussed recommendation for MRI with patient and he was eventually agreeable.  MRI shows a cluster of cerebellar lesion similar to previous MRI scan obtained in 2017.  No acute findings.  Discussed all findings with patient and father at bedside.  Patient is no longer an active patient with neurology.  Will refer him to neurology for further outpatient workup.  No acute findings or indication for admission today.  Patient expressed understanding of plan.  He will follow-up on Lyme disease and [facility] spotted fever testing on MyChart.  Strict ED return precautions given, all questions answered, and stable for discharge.   Clinical Impression:  1. Altered mental status, unspecified altered mental status type   2. Cerebellar lesion   3. Tick bite of abdominal wall, initial encounter      Discharge        Final Clinical Impression(s) / ED Diagnoses Final diagnoses:  Altered mental status, unspecified altered mental status type  Cerebellar lesion  Tick bite of abdominal wall, initial encounter    Rx / DC Orders ED Discharge Orders          Ordered    Ambulatory referral to Neurology       Comments: An appointment is requested in approximately: 2 weeks   04/11/23 1520              Richardson Dopp 04/11/23 1538    Alvira Monday, MD 04/11/23 2324

## 2023-04-11 NOTE — ED Triage Notes (Signed)
Pt states he has had a TIA in the past within the last week he felt off. Pt says since Saturday he has felt off and on Wednesday he felt bad after leaving work. Pt says he wasn't able to focus and lightheaded. Pt check his sugar 72. Pt says cognitively it is hard to process things and make connections like solving simple problems and addition. Pt says the back of neck is hurting.

## 2023-04-11 NOTE — ED Notes (Signed)
Pt has lost over 80lbs within a year and half

## 2023-04-11 NOTE — ED Notes (Signed)
Pt wanted to know if his lab work could detect [facility] Spotted Fever. RN notified EDP

## 2023-04-13 LAB — LYME DISEASE DNA BY PCR(BORRELIA BURG): Lyme Disease(B.burgdorferi)PCR: NEGATIVE

## 2023-05-01 LAB — SPOTTED FEVER GROUP ANTIBODIES
Spotted Fever Group IgG: <1:64 {titer}
Spotted Fever Group IgM: <1:64 {titer}

## 2023-05-05 ENCOUNTER — Ambulatory Visit (INDEPENDENT_AMBULATORY_CARE_PROVIDER_SITE_OTHER): Payer: No Typology Code available for payment source | Admitting: Nurse Practitioner

## 2023-05-05 ENCOUNTER — Encounter: Payer: Self-pay | Admitting: Nurse Practitioner

## 2023-05-05 VITALS — BP 130/90 | HR 80 | Temp 97.7°F | Ht 73.0 in | Wt 263.2 lb

## 2023-05-05 DIAGNOSIS — E1159 Type 2 diabetes mellitus with other circulatory complications: Secondary | ICD-10-CM

## 2023-05-05 DIAGNOSIS — G939 Disorder of brain, unspecified: Secondary | ICD-10-CM

## 2023-05-05 DIAGNOSIS — Z79899 Other long term (current) drug therapy: Secondary | ICD-10-CM

## 2023-05-05 DIAGNOSIS — E1169 Type 2 diabetes mellitus with other specified complication: Secondary | ICD-10-CM

## 2023-05-05 DIAGNOSIS — I10 Essential (primary) hypertension: Secondary | ICD-10-CM | POA: Diagnosis not present

## 2023-05-05 DIAGNOSIS — E785 Hyperlipidemia, unspecified: Secondary | ICD-10-CM

## 2023-05-05 DIAGNOSIS — E669 Obesity, unspecified: Secondary | ICD-10-CM

## 2023-05-05 DIAGNOSIS — Z8673 Personal history of transient ischemic attack (TIA), and cerebral infarction without residual deficits: Secondary | ICD-10-CM

## 2023-05-05 NOTE — Progress Notes (Signed)
FOLLOW UP  Assessment and Plan:   Type 2 diabetes mellitus with other circulatory complication, without long-term current use of insulin (HCC) Discussed with concurrent weight loss to continue monitoring BG.  He may need to wean from Metformin and Jardiance while continuing Mounjaro. Education: Reviewed 'ABCs' of diabetes management  Discussed goals to be met and/or maintained include A1C (<7) Blood pressure (<130/80) Cholesterol (LDL <70) Continue Eye Exam yearly  Continue Dental Exam Q6 mo Discussed dietary recommendations Discussed Physical Activity recommendations Foot exam UTD Check A1C  Obesity Trending down. Continue medications. Continue lifestyle modifications.   Hypertension, unspecified type Continue Lisinopril 10 mg QD.  If BP remains >100/90 will need to stop BP medications completely. Discussed DASH (Dietary Approaches to Stop Hypertension) DASH diet is lower in sodium than a typical American diet. Cut back on foods that are high in saturated fat, cholesterol, and trans fats. Eat more whole-grain foods, fish, poultry, and nuts Remain active and exercise as tolerated daily.  Monitor BP at home-Call if greater than 130/80.  Check CMP/CBC  Hyperlipidemia associated with type 2 diabetes mellitus (HCC) Controlled. Discussed lifestyle modifications. Recommended diet heavy in fruits and veggies, omega 3's. Decrease consumption of animal meats, cheeses, and dairy products. Remain active and exercise as tolerated. Continue to monitor. Check lipids/TSH  Medication management All medications discussed and reviewed in full. All questions and concerns regarding medications addressed.    Cerebellar lesion/Hx of CVA Continue to follow up with Neurology - not yet heard from ED discharge. Will place referral back to Dr. Pearlean Brownie. Report to ER for any increase in stroke like symptoms, including HA, N/V, paralysis, difficulty speaking, trouble walking, confusion, vision  changes, CP, heart palpitations, SOB, diaphoresis.  Orders Placed This Encounter  Procedures   Ambulatory referral to Neurology    Referral Priority:   Routine    Referral Type:   Consultation    Referral Reason:   Specialty Services Required    Referred to Provider:   Micki Riley, MD    Requested Specialty:   Neurology    Number of Visits Requested:   1    Notify office for further evaluation and treatment, questions or concerns if any reported s/s fail to improve.   The patient was advised to call back or seek an in-person evaluation if any symptoms worsen or if the condition fails to improve as anticipated.   Further disposition pending results of labs. Discussed med's effects and SE's.    I discussed the assessment and treatment plan with the patient. The patient was provided an opportunity to ask questions and all were answered. The patient agreed with the plan and demonstrated an understanding of the instructions.  Discussed med's effects and SE's. Screening labs and tests as requested with regular follow-up as recommended.  I provided 25 minutes of face-to-face time during this encounter including counseling, chart review, and critical decision making was preformed.  Today's Plan of Care is based on a patient-centered health care approach known as shared decision making - the decisions, tests and treatments allow for patient preferences and values to be balanced with clinical evidence.     Future Appointments  Date Time Provider Department Center  10/07/2023  3:00 PM Raynelle Dick, NP [facility] None    ----------------------------------------------------------------------------------------------------------------------  HPI 37 y.o. male  presents for 3 month follow up on hypertension, cholesterol, diabetes, weight and vitamin D deficiency.   He had a recent ER visit on 04/11/23 due to "not feeling himself" for a  week. He felt to have cognitive slowing while playing  cards with his father and also when playing a video game on his phone.  His glucose was 71 at that time. He ate something with improvement.  Work up in the ER was overall negative.  Brain CT revealed no acute finding and followed up with MRI which confirmed cerebellar lesions similar to previous which appeared chronic.  He has a hx of CVA and followed with Dr. Pearlean Brownie in the past, last seen 07/2019.    He is continuing Mounjaro successfully for weight loss and A1C control.  He is continuing to lose weight.   BMI is Body mass index is 34.73 kg/m., Wt Readings from Last 3 Encounters:  05/05/23 263 lb 3.2 oz (119.4 kg)  04/11/23 245 lb (111.1 kg)  01/22/23 265 lb (120.2 kg)    He has been working on diet and exercise for prediabetes, and denies hyperglycemia, hypoglycemia , paresthesia of the feet, polydipsia, and polyuria. Last A1C in the office was:  Lab Results  Component Value Date   HGBA1C 5.9 (H) 10/03/2022     BP remains well controlled on current medication.  He does workout and very active playing soccer. He denies chest pain, shortness of breath, dizziness. BP Readings from Last 3 Encounters:  05/05/23 (!) 130/90  04/11/23 136/76  01/22/23 116/72    He is on cholesterol medication Rosuvastatin and denies myalgias. His cholesterol is trending towards goal. The cholesterol last visit was:   Lab Results  Component Value Date   CHOL 178 10/03/2022   HDL 38 (L) 10/03/2022   LDLCALC 115 (H) 10/03/2022   TRIG 133 10/03/2022   CHOLHDL 4.7 10/03/2022   Patient is on Vitamin D supplement.   Lab Results  Component Value Date   VD25OH 31 10/03/2022        Current Medications:  Current Outpatient Medications on File Prior to Visit  Medication Sig   albuterol (VENTOLIN HFA) 108 (90 Base) MCG/ACT inhaler Inhale 2 puffs into the lungs every 6 (six) hours as needed for wheezing or shortness of breath.   predniSONE (DELTASONE) 10 MG tablet 1 tab 3 x day for 2 days, then 1 tab 2 x day  for 2 days, then 1 tab 1 x day for 3 days   promethazine-dextromethorphan (PROMETHAZINE-DM) 6.25-15 MG/5ML syrup Take 5 mLs by mouth 4 (four) times daily as needed for cough.   rosuvastatin (CRESTOR) 40 MG tablet TAKE ONE TABLET BY MOUTH DAILY FOR HIGH CHOLESTEROL   tirzepatide (MOUNJARO) 15 MG/0.5ML Pen Inject 15 mg into the skin once a week.   No current facility-administered medications on file prior to visit.     Allergies:  Allergies  Allergen Reactions   Bisoprolol     Numbness, tingling, feels bad   Codeine Swelling     Medical History:  Past Medical History:  Diagnosis Date   Alcohol consumption binge drinking 07/17/2018   12-24 beers on the weekends   Diabetes mellitus without complication (HCC)    Former smoker (quit 07/2018, 7.5 pack year history) 07/17/2018   History of COVID-19 11/15/2019   Hypertension    Morbid obesity (HCC)    Stroke [facility])    "mini stroke"   Family history- Reviewed and unchanged Social history- Reviewed and unchanged   Review of Systems: All review systems reviewed and negative except for pertinent positives in history of present illness.   Physical Exam: BP (!) 130/90   Pulse 80   Temp 97.7 F (  36.5 C)   Ht 6\' 1"  (1.854 m)   Wt 263 lb 3.2 oz (119.4 kg)   SpO2 99%   BMI 34.73 kg/m  Wt Readings from Last 3 Encounters:  05/05/23 263 lb 3.2 oz (119.4 kg)  04/11/23 245 lb (111.1 kg)  01/22/23 265 lb (120.2 kg)   General Appearance: Well nourished, in no apparent distress. Eyes: PERRLA, EOMs, conjunctiva no swelling or erythema Sinuses: No Frontal/maxillary tenderness ENT/Mouth: Ext aud canals clear, TMs without erythema, bulging. No erythema, swelling, or exudate on post pharynx.  Tonsils not swollen or erythematous. Hearing normal.  Neck: Supple, thyroid normal.  Respiratory: Respiratory effort normal, BS equal bilaterally without rales, rhonchi, wheezing or stridor.  Cardio: RRR with no MRGs. Brisk peripheral pulses without  edema.  Abdomen: Soft, + BS.  Non tender, no guarding, rebound, hernias, masses. Lymphatics: Non tender without lymphadenopathy.  Musculoskeletal: Full ROM, 5/5 strength, Normal gait Skin: Warm, dry without rashes, lesions, ecchymosis.  Neuro: Cranial nerves intact. No cerebellar symptoms.  Psych: Awake and oriented X 3, normal affect, Insight and Judgment appropriate.    Adela Glimpse, NP 10:04 PM [facility]

## 2023-05-11 NOTE — Patient Instructions (Signed)

## 2023-06-11 ENCOUNTER — Institutional Professional Consult (permissible substitution): Payer: Self-pay | Admitting: Neurology

## 2023-06-12 ENCOUNTER — Institutional Professional Consult (permissible substitution): Payer: Self-pay | Admitting: Neurology

## 2023-06-24 ENCOUNTER — Encounter: Payer: Self-pay | Admitting: Nurse Practitioner

## 2023-06-24 DIAGNOSIS — E1159 Type 2 diabetes mellitus with other circulatory complications: Secondary | ICD-10-CM

## 2023-06-25 MED ORDER — TIRZEPATIDE 15 MG/0.5ML ~~LOC~~ SOAJ
15.0000 mg | SUBCUTANEOUS | 1 refills | Status: DC
Start: 2023-06-25 — End: 2024-05-13

## 2023-07-07 ENCOUNTER — Institutional Professional Consult (permissible substitution): Payer: Self-pay | Admitting: Neurology

## 2023-07-07 ENCOUNTER — Telehealth: Payer: Self-pay | Admitting: Nurse Practitioner

## 2023-07-07 NOTE — Telephone Encounter (Signed)
Pt is requesting new form for 2024 that states he has a history of TIA. The papers are in the chart.

## 2023-07-08 NOTE — Telephone Encounter (Signed)
Forms completed and picked up by the patient.

## 2023-09-08 ENCOUNTER — Encounter: Payer: No Typology Code available for payment source | Admitting: Nurse Practitioner

## 2023-10-07 ENCOUNTER — Encounter: Payer: No Typology Code available for payment source | Admitting: Nurse Practitioner

## 2023-11-21 ENCOUNTER — Other Ambulatory Visit: Payer: Self-pay | Admitting: Infectious Diseases

## 2023-11-21 MED ORDER — MELOXICAM 15 MG PO TABS: 15.0000 mg | ORAL_TABLET | Freq: Every day | ORAL | 2 refills | Status: DC

## 2024-05-13 ENCOUNTER — Ambulatory Visit (INDEPENDENT_AMBULATORY_CARE_PROVIDER_SITE_OTHER): Payer: PRIVATE HEALTH INSURANCE | Admitting: Student

## 2024-05-13 ENCOUNTER — Encounter: Payer: Self-pay | Admitting: Student

## 2024-05-13 VITALS — BP 138/86 | HR 76 | Resp 16 | Ht 73.0 in | Wt 291.8 lb

## 2024-05-13 DIAGNOSIS — E1169 Type 2 diabetes mellitus with other specified complication: Secondary | ICD-10-CM | POA: Diagnosis not present

## 2024-05-13 DIAGNOSIS — Z6838 Body mass index (BMI) 38.0-38.9, adult: Secondary | ICD-10-CM

## 2024-05-13 DIAGNOSIS — Z7689 Persons encountering health services in other specified circumstances: Secondary | ICD-10-CM

## 2024-05-13 DIAGNOSIS — Z7985 Long-term (current) use of injectable non-insulin antidiabetic drugs: Secondary | ICD-10-CM

## 2024-05-13 DIAGNOSIS — E669 Obesity, unspecified: Secondary | ICD-10-CM

## 2024-05-13 DIAGNOSIS — R5383 Other fatigue: Secondary | ICD-10-CM | POA: Diagnosis not present

## 2024-05-13 DIAGNOSIS — I10 Essential (primary) hypertension: Secondary | ICD-10-CM | POA: Diagnosis not present

## 2024-05-13 DIAGNOSIS — Z8673 Personal history of transient ischemic attack (TIA), and cerebral infarction without residual deficits: Secondary | ICD-10-CM

## 2024-05-13 MED ORDER — TIRZEPATIDE 2.5 MG/0.5ML ~~LOC~~ SOAJ
2.5000 mg | SUBCUTANEOUS | 0 refills | Status: DC
Start: 2024-05-13 — End: 2024-06-09

## 2024-05-13 NOTE — Assessment & Plan Note (Addendum)
 Hx TIA 2017. Advise Aspirin 81 mg daily for stroke prevention and risk factor modification.

## 2024-05-13 NOTE — Assessment & Plan Note (Addendum)
 Update A1C today. Rx- restart Mounjaro  0.5 mg injection weekly.  Fair life protein shakes Eat more frequently - try not to go more than 6 hours without protein Aim for 90 grams of protein a day- 30 breakfast/30 lunch 30 dinner Try to keep net carbs less than 50 Net Carbs=Total Carbs-fiber- sugar alcohols Exercise heartrate 120-140(fat burning zone)- walking 20-30 minutes 4 days a week

## 2024-05-13 NOTE — Assessment & Plan Note (Addendum)
 BP goal >130/80. Encouraged heart healthy diet such as the DASH diet and exercise as tolerated.  Patient was previously on lisinopril , he prefers to not take medication at this time and would like to implement healthy lifestyle choices.  Encourage patient to monitor blood pressure at home.

## 2024-05-13 NOTE — Patient Instructions (Addendum)
 Thank you for choosing [facility] for your Primary Care needs. I am excited for the opportunity to partner with you to meet your health care goals. It was a pleasure meeting you today!  Information on diet, exercise, and health maintenance recommendations are listed below. This is information to help you be sure you are on track for optimal health and monitoring.   Please look over this and let us  know if you have any questions or if you have completed any of the health maintenance outside of [facility] so that we can be sure your records are up to date.  ___________________________________________________________  MyChart:  For all urgent or time sensitive needs we ask that you please call the office to avoid delays. Our number is [phone]. MyChart is not constantly monitored and due to the large volume of messages a day, replies may take up to 72 business hours.  MyChart Policy: MyChart allows for you to see your visit notes, after visit summary, provider recommendations, lab and tests results, make an appointment, request refills, and contact your provider or the office for non-urgent questions or concerns. Providers are seeing patients during normal business hours and do not have built in time to review MyChart messages.  We ask that you allow a minimum of 3 business days for responses to [employer]. For this reason, please do not send urgent requests through MyChart. Please call the office at [phone]. New and ongoing conditions may require a visit. We have virtual and in-person visits available for your convenience.  Complex MyChart concerns may require a visit. Your provider may request you schedule a virtual or in-person visit to ensure we are providing the best care possible. MyChart messages sent after 11:00 AM on Friday may not be received by the provider until Monday morning.    Lab and Test Results: You will receive your lab and test  results on MyChart as soon as they are completed and results have been sent by the lab or testing facility. Due to this service, you will receive your results BEFORE your provider.  I review lab and test results each morning prior to seeing patients. Some results require collaboration with other providers to ensure you are receiving the most appropriate care. For this reason, we ask that you please allow a minimum of 3-5 business days from the time that ALL results have been received for your provider to receive and review lab and test results and contact you about these.  Most lab and test result comments from the provider will be sent through MyChart. Your provider may recommend changes to the plan of care, follow-up visits, repeat testing, ask questions, or request an office visit to discuss these results. You may reply directly to this message or call the office to provide information for the provider or set up an appointment. In some instances, you will be called with test results and recommendations. Please let us  know if this is preferred and we will make note of this in your chart to provide this for you.    If you have not heard a response to your lab or test results in 5 business days from all results returning to MyChart, please call the office to let us  know. We ask that you please avoid calling prior to this time unless there is an emergent concern. Due to high call volumes, this can delay the resulting process.  After Hours: For all non-emergency after hours needs, please  call the office at [phone] and select the option to reach the on-call  service. On-call services are shared between multiple [place] offices and therefore it will not be possible to speak directly with your provider. On-call providers may provide medical advice and recommendations, but are unable to provide refills for maintenance medications.  For all emergency or urgent medical needs after normal business hours, we  recommend that you seek care at the closest Urgent Care or Emergency Department to ensure appropriate treatment in a timely manner.  [facility] has a 24 hour emergency room located on the ground floor for your convenience.   Urgent Concerns During the Business Day Providers are seeing patients from 8AM to 5PM with a busy schedule and are most often not able to respond to non-urgent calls until the end of the day or the next business day. If you should have URGENT concerns during the day, please call and speak to the nurse or schedule a same day appointment so that we can address your concern without delay.   Thank you, again, for choosing me as your health care partner. I appreciate your trust and look forward to learning more about you!   Melville Stade, DNP, AGNP-C  ___________________________________________________________  Health Maintenance Recommendations Screening Testing Mammogram Every 1-2 years based on history and risk factors Starting at age 54 Pap Smear Ages 21-39 every 3 years Ages 32-65 every 5 years with HPV testing More frequent testing may be required based on results and history Colon Cancer Screening Every 1-10 years based on test performed, risk factors, and history Starting at age 77 Bone Density Screening Every 2-10 years based on history Starting at age 49 for women Recommendations for men differ based on medication usage, history, and risk factors AAA Screening One time ultrasound Men 11-2 years old who have ever smoked Lung Cancer Screening Low Dose Lung CT every 12 months Age 84-80 years with a 20 pack-year smoking history who still smoke or who have quit within the last 15 years  Screening Labs Routine  Labs: Complete Blood Count (CBC), Complete Metabolic Panel (CMP), Cholesterol (Lipid Panel) Every 6-12 months based on history and medications May be recommended more frequently based on current conditions or previous results Hemoglobin  A1c Lab Every 3-12 months based on history and previous results Starting at age 65 or earlier with diagnosis of diabetes, high cholesterol, BMI >26, and/or risk factors Frequent monitoring for patients with diabetes to ensure blood sugar control Thyroid Panel  Every 6 months based on history, symptoms, and risk factors May be repeated more often if on medication HIV One time testing for all patients 88 and older May be repeated more frequently for patients with increased risk factors or exposure Hepatitis C One time testing for all patients 29 and older May be repeated more frequently for patients with increased risk factors or exposure Gonorrhea, Chlamydia Every 12 months for all sexually active persons 13-24 years Additional monitoring may be recommended for those who are considered high risk or who have symptoms PSA Men 16-24 years old with risk factors Additional screening may be recommended from age 36-69 based on risk factors, symptoms, and history  Vaccine Recommendations Tetanus Booster All adults every 10 years Flu Vaccine All patients 6 months and older every year COVID Vaccine All patients 12 years and older Initial dosing with booster May recommend additional booster based on age and health history HPV Vaccine 2 doses all patients age 17-26 Dosing may be considered for  patients over 26 Shingles Vaccine (Shingrix) 2 doses all adults 50 years and older Pneumonia (Pneumovax 23) All adults 65 years and older May recommend earlier dosing based on health history Pneumonia (Prevnar 77) All adults 65 years and older Dosed 1 year after Pneumovax 23 Pneumonia (Prevnar 20) All adults 65 years and older (adults 19-64 with certain conditions or risk factors) 1 dose  For those who have not received Prevnar 13 vaccine previously   Additional Screening, Testing, and Vaccinations may be recommended on an individualized basis based on family history, health history, risk  factors, and/or exposure.  __________________________________________________________  Diet Recommendations for All Patients  I recommend that all patients maintain a diet low in saturated fats, carbohydrates, and cholesterol. While this can be challenging at first, it is not impossible and small changes can make big differences.  Things to try: Decreasing the amount of soda, sweet tea, and/or juice to one or less per day and replace with water While water is always the first choice, if you do not like water you may consider adding a water additive without sugar to improve the taste other sugar free drinks Replace potatoes with a brightly colored vegetable  Use healthy oils, such as canola oil or olive oil, instead of butter or hard margarine Limit your bread intake to two pieces or less a day Replace regular pasta with low carb pasta options Bake, broil, or grill foods instead of frying Monitor portion sizes  Eat smaller, more frequent meals throughout the day instead of large meals  An important thing to remember is, if you love foods that are not great for your health, you don't have to give them up completely. Instead, allow these foods to be a reward when you have done well. Allowing yourself to still have special treats every once in a while is a nice way to tell yourself thank you for working hard to keep yourself healthy.   Also remember that every day is a new day. If you have a bad day and "fall off the wagon", you can still climb right back up and keep moving along on your journey!  We have resources available to help you!  Some websites that may be helpful include: www.http://www.wall-moore.info/  Www.VeryWellFit.com _____________________________________________________________  Activity Recommendations for All Patients  I recommend that all adults get at least 30 minutes of moderate physical activity that elevates your heart rate at least 5 days out of the week.  Some examples  include: Walking or jogging at a pace that allows you to carry on a conversation Cycling (stationary bike or outdoors) Water aerobics Yoga Weight lifting Dancing If physical limitations prevent you from putting stress on your joints, exercise in a pool or seated in a chair are excellent options.  Do determine your MAXIMUM heart rate for activity: 220 - YOUR AGE = MAX Heart Rate   Remember! Do not push yourself too hard.  Start slowly and build up your pace, speed, weight, time in exercise, etc.  Allow your body to rest between exercise and get good sleep. You will need more water than normal when you are exerting yourself. Do not wait until you are thirsty to drink. Drink with a purpose of getting in at least 8, 8 ounce glasses of water a day plus more depending on how much you exercise and sweat.    If you begin to develop dizziness, chest pain, abdominal pain, jaw pain, shortness of breath, headache, vision changes, lightheadedness, or other concerning symptoms, stop  the activity and allow your body to rest. If your symptoms are severe, seek emergency evaluation immediately. If your symptoms are concerning, but not severe, please let us  know so that we can recommend further evaluation.

## 2024-05-13 NOTE — Progress Notes (Signed)
 Subjective:     Patient ID: Jared Ramsey, male    DOB: 1986-08-04, 38 y.o.   MRN: [id number]  Chief Complaint  Patient presents with   New Patient (Initial Visit)    Patient presents today for a new patient appointment.    HPI Jared Ramsey is a 38 year old male presents to establish care. 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  Driving. Married, 2 children- Isabella and [place]. Highest degree- Education administrator. PMHx- DM II, HTN, Hx CVA/ tia 2017.  EtOH-yes, former smoker.  Patient desires to lose weight with goals to minimize medications as much as possible and implement healthy lifestyle choices. Additional complaints of fatigue.  Unsure if this is related to situational/home factors but is interested in checking testosterone  levels.  Records Obtained: Yes   Prior PCP: Dr. Vana   DM II No medications currently, he was previously on 15 mg Mounjaro  and was successful but due to changes in insurance   HTN-  not on medication currently, previously on Lisinopril   Asthma Albuterol  PRN  Patient denies fever, chills, SOB, CP, palpitations, dyspnea, edema, HA, vision changes, N/V/D, abdominal pain, urinary symptoms, rash, weight changes, and recent illness or hospitalizations.    History of Present Illness              Health Maintenance Due  Topic Date Due   COVID-19 Vaccine (1) Never done   OPHTHALMOLOGY EXAM  Never done   Hepatitis C Screening  Never done   HPV VACCINES (1 - 3-dose SCDM series) Never done   FOOT EXAM  07/31/2022   HEMOGLOBIN A1C  04/04/2023   Diabetic kidney evaluation - Urine ACR  10/04/2023   Diabetic kidney evaluation - eGFR measurement  04/10/2024   INFLUENZA VACCINE  05/07/2024    Past Medical History:  Diagnosis Date   Alcohol consumption binge drinking 07/17/2018   12-24 beers on the weekends   Diabetes mellitus without complication (HCC)    Former smoker (quit 07/2018, 7.5 pack  year history) 07/17/2018   History of COVID-19 11/15/2019   Hypertension    Morbid obesity (HCC)    Stroke (HCC)    mini stroke    Past Surgical History:  Procedure Laterality Date   PLANTAR'S WART EXCISION Left 2003   TONSILLECTOMY      Family History  Problem Relation Age of Onset   Diabetes Mother        resolved post gastric bypass   Lymphoma Mother    Hypertension Mother    Hyperlipidemia Father    Hypertension Father    Diabetes Sister    Hypertension Sister    Epilepsy Sister    Alzheimer's disease Maternal Grandmother    Diabetes Maternal Grandmother    Hypertension Maternal Grandmother    Alzheimer's disease Paternal Grandmother    Emphysema Paternal Grandmother        smoker   Hypertension Paternal Grandfather    Hyperlipidemia Paternal Grandfather     Social History   Socioeconomic History   Marital status: Married    Spouse name: Not on file   Number of children: 1   Years of education: Not on file   Highest education level: Associate degree: occupational, Scientist, product/process development, or vocational program  Occupational History   Occupation: Airline pilot  Tobacco Use   Smoking status: Former    Current packs/day: 0.00    Average packs/day: 0.5 packs/day for 15.0 years (7.5 ttl pk-yrs)  Types: Cigarettes    Start date: 07/15/2003    Quit date: 07/14/2018    Years since quitting: 5.8   Smokeless tobacco: Never  Vaping Use   Vaping status: Former  Substance and Sexual Activity   Alcohol use: Yes    Alcohol/week: 6.0 standard drinks of alcohol    Types: 6 Standard drinks or equivalent per week   Drug use: Never   Sexual activity: Yes    Partners: Female  Other Topics Concern   Not on file  Social History Narrative   Not on file   Social Drivers of Health   Financial Resource Strain: Patient Declined (05/12/2024)   Overall Financial Resource Strain (CARDIA)    Difficulty of Paying Living Expenses: Patient declined  Food Insecurity: Patient Declined  (05/12/2024)   Hunger Vital Sign    Worried About Running Out of Food in the Last Year: Patient declined    Ran Out of Food in the Last Year: Patient declined  Transportation Needs: Patient Declined (05/12/2024)   PRAPARE - Administrator, Civil Service (Medical): Patient declined    Lack of Transportation (Non-Medical): Patient declined  Physical Activity: Sufficiently Active (05/12/2024)   Exercise Vital Sign    Days of Exercise per Week: 5 days    Minutes of Exercise per Session: 60 min  Stress: No Stress Concern Present (05/12/2024)   [employer] of Occupational Health - Occupational Stress Questionnaire    Feeling of Stress: Not at all  Social Connections: Moderately Integrated (05/12/2024)   Social Connection and Isolation Panel    Frequency of Communication with Friends and Family: More than three times a week    Frequency of Social Gatherings with Friends and Family: More than three times a week    Attends Religious Services: 1 to 4 times per year    Active Member of [employer] or Organizations: No    Attends Engineer, structural: Not on file    Marital Status: Married  Catering manager Violence: Not on file    Outpatient Medications Prior to Visit  Medication Sig Dispense Refill   albuterol  (VENTOLIN  HFA) 108 (90 Base) MCG/ACT inhaler Inhale 2 puffs into the lungs every 6 (six) hours as needed for wheezing or shortness of breath. 8 g 2   meloxicam  (MOBIC ) 15 MG tablet Take 1 tablet (15 mg total) by mouth daily. 30 tablet 2   tirzepatide  (MOUNJARO ) 15 MG/0.5ML Pen Inject 15 mg into the skin once a week. (Patient not taking: Reported on 05/13/2024) 6 mL 1   No facility-administered medications prior to visit.    Allergies  Allergen Reactions   Bisoprolol      Numbness, tingling, feels bad   Codeine Swelling    ROS See HPI    Objective:    Physical Exam  General: No acute distress. Awake and conversant. +obese  Eyes: Normal conjunctiva, anicteric. Round  symmetric pupils.  Respiratory: CTAB. Respirations are non-labored. No wheezing.  Skin: Warm. No rashes or ulcers.  Psych: Alert and oriented. Cooperative, Appropriate mood and affect, CV: RRR. No murmur. No lower extremity edema.  MSK: Normal ambulation. No clubbing or cyanosis.  Neuro:  CN II-XII grossly normal.    BP 138/86   Pulse 76   Resp 16   Ht 6' 1 (1.854 m)   Wt 291 lb 12.8 oz (132.4 kg)   SpO2 98%   BMI 38.50 kg/m  Wt Readings from Last 3 Encounters:  05/13/24 291 lb 12.8 oz (132.4 kg)  05/05/23 263 lb 3.2 oz (119.4 kg)  04/11/23 245 lb (111.1 kg)       Assessment & Plan:   Problem List Items Addressed This Visit     Fatigue   Check testosterone .  Consider urology referral if low.      Relevant Orders   Testosterone    History of CVA (cerebrovascular accident)   Hx TIA 2017. Advise Aspirin 81 mg daily for stroke prevention and risk factor modification.      Hypertension   BP goal >130/80. Encouraged heart healthy diet such as the DASH diet and exercise as tolerated.  Patient was previously on lisinopril , he prefers to not take medication at this time and would like to implement healthy lifestyle choices.  Encourage patient to monitor blood pressure at home.      Type 2 diabetes mellitus with obesity (HCC)   Update A1C today. Rx- restart Mounjaro  0.5 mg injection weekly.  Fair life protein shakes Eat more frequently - try not to go more than 6 hours without protein Aim for 90 grams of protein a day- 30 breakfast/30 lunch 30 dinner Try to keep net carbs less than 50 Net Carbs=Total Carbs-fiber- sugar alcohols Exercise heartrate 120-140(fat burning zone)- walking 20-30 minutes 4 days a week        Relevant Medications   tirzepatide  (MOUNJARO ) 2.5 MG/0.5ML Pen   Other Relevant Orders   Hemoglobin A1c   Other Visit Diagnoses       Encounter to establish care    -  Primary      FU 4 weeks CPE  Portions of this note were dictated using DRAGON voice  recognition software. Please disregard any errors in transcription.     I have discontinued Johntae Broxterman. Schwertner's tirzepatide . I am also having him start on tirzepatide . Additionally, I am having him maintain his albuterol  and meloxicam .  Meds ordered this encounter  Medications   tirzepatide  (MOUNJARO ) 2.5 MG/0.5ML Pen    Sig: Inject 2.5 mg into the skin once a week.    Dispense:  2 mL    Refill:  0    Supervising Provider:   DOMENICA BLACKBIRD A [[id number]]

## 2024-05-13 NOTE — Assessment & Plan Note (Addendum)
 Check testosterone .  Consider urology referral if low.

## 2024-05-14 ENCOUNTER — Telehealth: Payer: Self-pay

## 2024-05-14 ENCOUNTER — Encounter: Payer: Self-pay | Admitting: *Deleted

## 2024-05-14 ENCOUNTER — Other Ambulatory Visit (HOSPITAL_COMMUNITY): Payer: Self-pay

## 2024-05-14 LAB — HEMOGLOBIN A1C: Hgb A1c MFr Bld: 7.1 % — ABNORMAL HIGH (ref 4.6–6.5)

## 2024-05-14 LAB — TESTOSTERONE: Testosterone: 367.6 ng/dL (ref 300.00–890.00)

## 2024-05-14 NOTE — Telephone Encounter (Signed)
 Mychart message sent to patient.

## 2024-05-14 NOTE — Telephone Encounter (Signed)
 Pharmacy Patient Advocate Encounter   Received notification from Pt Calls Messages that prior authorization for Mounjaro  2.5mg /0.70ml is required/requested.   Insurance verification completed.   The patient is insured through [facility] .   Per test claim: PA required; PA submitted to above mentioned insurance via CoverMyMeds Key/confirmation #/EOC [id number] Status is pending

## 2024-05-14 NOTE — Telephone Encounter (Signed)
 Copied from CRM [id number]. Topic: Referral - Prior Authorization Question >> May 13, 2024  4:02 PM Paige D wrote: Reason for CRM: PT is stating pharmacy stated he needs a prior auth for his Monjaro 2.5 MG. Pt is asking if this can be taken care of. Please reach out to pt in regards to this when there is a status

## 2024-05-14 NOTE — Telephone Encounter (Signed)
 Pharmacy Patient Advocate Encounter  Received notification from [employer] that Prior Authorization for Mounjaro  2.5mg /0.39ml has been APPROVED from 05/14/24 to 05/14/25. Ran test claim, Copay is $35. This test claim was processed through [facility]- copay amounts may vary at other pharmacies due to pharmacy/plan contracts, or as the patient moves through the different stages of their insurance plan.   PA #/Case ID/Reference #: [id number]  Placed a call to [facility] to notify of the approval

## 2024-05-16 ENCOUNTER — Ambulatory Visit: Payer: Self-pay | Admitting: Student

## 2024-06-08 ENCOUNTER — Encounter: Payer: Self-pay | Admitting: Student

## 2024-06-09 MED ORDER — TIRZEPATIDE 5 MG/0.5ML ~~LOC~~ SOAJ: 5.0000 mg | SUBCUTANEOUS | 1 refills | Status: DC

## 2024-06-14 ENCOUNTER — Encounter: Payer: Self-pay | Admitting: Student

## 2024-06-14 DIAGNOSIS — Z0000 Encounter for general adult medical examination without abnormal findings: Secondary | ICD-10-CM | POA: Insufficient documentation

## 2024-06-14 NOTE — Progress Notes (Unsigned)
 Subjective:     Patient ID: Jared Ramsey, male    DOB: 1986-03-03, 38 y.o.   MRN: [id number]  No chief complaint on file.   HPI  FU 3 months Mounjaro   *DM eye exam?   DM II 5 mg Mounjaro  injection weekly   HTN-  not on medication currently, previously on Lisinopril    Asthma Albuterol  PRN   Patient denies fever, chills, SOB, CP, palpitations, dyspnea, edema, HA, vision changes, N/V/D, abdominal pain, urinary symptoms, rash, weight changes, and recent illness or hospitalizations.   History of Present Illness              Health Maintenance Due  Topic Date Due   COVID-19 Vaccine (1) Never done   OPHTHALMOLOGY EXAM  Never done   Hepatitis C Screening  Never done   HPV VACCINES (1 - 3-dose SCDM series) Never done   FOOT EXAM  07/31/2022   Diabetic kidney evaluation - Urine ACR  10/04/2023   Diabetic kidney evaluation - eGFR measurement  04/10/2024   Influenza Vaccine  05/07/2024    Past Medical History:  Diagnosis Date   Alcohol consumption binge drinking 07/17/2018   12-24 beers on the weekends   Diabetes mellitus without complication (HCC)    Former smoker (quit 07/2018, 7.5 pack year history) 07/17/2018   History of COVID-19 11/15/2019   Hypertension    Morbid obesity (HCC)    Stroke (HCC)    mini stroke    Past Surgical History:  Procedure Laterality Date   PLANTAR'S WART EXCISION Left 2003   TONSILLECTOMY      Family History  Problem Relation Age of Onset   Diabetes Mother        resolved post gastric bypass   Lymphoma Mother    Hypertension Mother    Hyperlipidemia Father    Hypertension Father    Diabetes Sister    Hypertension Sister    Epilepsy Sister    Alzheimer's disease Maternal Grandmother    Diabetes Maternal Grandmother    Hypertension Maternal Grandmother    Alzheimer's disease Paternal Grandmother    Emphysema Paternal Grandmother        smoker   Hypertension Paternal Grandfather    Hyperlipidemia Paternal  Grandfather     Social History   Socioeconomic History   Marital status: Married    Spouse name: Not on file   Number of children: 1   Years of education: Not on file   Highest education level: Associate degree: occupational, Scientist, product/process development, or vocational program  Occupational History   Occupation: Airline pilot  Tobacco Use   Smoking status: Former    Current packs/day: 0.00    Average packs/day: 0.5 packs/day for 15.0 years (7.5 ttl pk-yrs)    Types: Cigarettes    Start date: 07/15/2003    Quit date: 07/14/2018    Years since quitting: 5.9   Smokeless tobacco: Never  Vaping Use   Vaping status: Former  Substance and Sexual Activity   Alcohol use: Yes    Alcohol/week: 6.0 standard drinks of alcohol    Types: 6 Standard drinks or equivalent per week   Drug use: Never   Sexual activity: Yes    Partners: Female  Other Topics Concern   Not on file  Social History Narrative   Not on file   Social Drivers of Health   Financial Resource Strain: Patient Declined (05/12/2024)   Overall Financial Resource Strain (CARDIA)    Difficulty of Paying Living Expenses: Patient declined  Food Insecurity: Patient Declined (05/12/2024)   Hunger Vital Sign    Worried About Running Out of Food in the Last Year: Patient declined    Ran Out of Food in the Last Year: Patient declined  Transportation Needs: Patient Declined (05/12/2024)   PRAPARE - Administrator, Civil Service (Medical): Patient declined    Lack of Transportation (Non-Medical): Patient declined  Physical Activity: Sufficiently Active (05/12/2024)   Exercise Vital Sign    Days of Exercise per Week: 5 days    Minutes of Exercise per Session: 60 min  Stress: No Stress Concern Present (05/12/2024)   [employer] of Occupational Health - Occupational Stress Questionnaire    Feeling of Stress: Not at all  Social Connections: Moderately Integrated (05/12/2024)   Social Connection and Isolation Panel    Frequency of  Communication with Friends and Family: More than three times a week    Frequency of Social Gatherings with Friends and Family: More than three times a week    Attends Religious Services: 1 to 4 times per year    Active Member of [employer] or Organizations: No    Attends Engineer, structural: Not on file    Marital Status: Married  Catering manager Violence: Not on file    Outpatient Medications Prior to Visit  Medication Sig Dispense Refill   albuterol  (VENTOLIN  HFA) 108 (90 Base) MCG/ACT inhaler Inhale 2 puffs into the lungs every 6 (six) hours as needed for wheezing or shortness of breath. 8 g 2   meloxicam  (MOBIC ) 15 MG tablet Take 1 tablet (15 mg total) by mouth daily. 30 tablet 2   tirzepatide  (MOUNJARO ) 5 MG/0.5ML Pen Inject 5 mg into the skin once a week. 2 mL 1   No facility-administered medications prior to visit.    Allergies  Allergen Reactions   Bisoprolol      Numbness, tingling, feels bad   Codeine Swelling    ROS     Objective:    Physical Exam Constitutional:      General: He is not in acute distress.    Appearance: He is obese. He is not ill-appearing, toxic-appearing or diaphoretic.  HENT:     Head: Normocephalic and atraumatic.     Right Ear: Tympanic membrane, ear canal and external ear normal.     Left Ear: Tympanic membrane, ear canal and external ear normal.     Nose: Nose normal. No congestion.     Mouth/Throat:     Mouth: Mucous membranes are moist.     Pharynx: Oropharynx is clear.  Eyes:     Extraocular Movements: Extraocular movements intact.     Right eye: Normal extraocular motion.     Left eye: Normal extraocular motion.     Conjunctiva/sclera: Conjunctivae normal.     Pupils: Pupils are equal, round, and reactive to light.  Neck:     Thyroid : No thyroid  mass or thyromegaly.     Vascular: No carotid bruit or JVD.  Cardiovascular:     Rate and Rhythm: Normal rate and regular rhythm.     Pulses: Normal pulses.          Radial  pulses are 2+ on the right side and 2+ on the left side.       Dorsalis pedis pulses are 2+ on the right side and 2+ on the left side.     Heart sounds: Normal heart sounds, S1 normal and S2 normal. No murmur heard.    No friction rub.  No gallop.  Pulmonary:     Effort: Pulmonary effort is normal. No respiratory distress.     Breath sounds: Normal breath sounds.  Abdominal:     General: Bowel sounds are normal. There is no distension.     Palpations: Abdomen is soft.     Tenderness: There is no abdominal tenderness. There is no guarding.  Musculoskeletal:        General: Normal range of motion.     Cervical back: Full passive range of motion without pain and normal range of motion. No edema or erythema.     Right lower leg: No edema.     Left lower leg: No edema.  Lymphadenopathy:     Cervical: No cervical adenopathy.  Skin:    General: Skin is warm and dry.     Capillary Refill: Capillary refill takes less than 2 seconds.  Neurological:     General: No focal deficit present.     Mental Status: He is alert and oriented to person, place, and time.     Cranial Nerves: No cranial nerve deficit.     Motor: No weakness.     Coordination: Coordination normal.     Gait: Gait normal.     Deep Tendon Reflexes: Reflexes normal.  Psychiatric:        Mood and Affect: Mood normal.        Behavior: Behavior normal.        Thought Content: Thought content normal.       There were no vitals taken for this visit. Wt Readings from Last 3 Encounters:  05/13/24 291 lb 12.8 oz (132.4 kg)  05/05/23 263 lb 3.2 oz (119.4 kg)  04/11/23 245 lb (111.1 kg)       Assessment & Plan:   Problem List Items Addressed This Visit     Annual visit for general adult medical examination without abnormal findings   Patient encouraged to maintain heart healthy diet, regular exercise, adequate sleep. Consider daily probiotics. Take medications as prescribed.        Hyperlipidemia associated with type 2  diabetes mellitus (HCC) - Primary   Encourage heart healthy diet such as MIND or DASH diet, increase exercise, avoid trans fats, simple carbohydrates and processed foods, consider a krill or fish or flaxseed oil cap daily.        Type 2 diabetes mellitus with obesity (HCC)   Stable on Mounjaro  5 mg weekly injection Minimize simple carbs. Increase exercise as tolerated. Continue current meds  Encouraged DASH or MIND diet, decrease po intake and increase exercise as tolerated. Needs 7-8 hours of sleep nightly. Avoid trans fats, eat small, frequent meals every 4-5 hours with lean proteins, complex carbs and healthy fats. Minimize simple carbs, high fat foods and processed foods         I am having Storm D. Papadakis maintain his albuterol , meloxicam , and tirzepatide .  No orders of the defined types were placed in this encounter.

## 2024-06-14 NOTE — Assessment & Plan Note (Signed)
 Patient encouraged to maintain heart healthy diet, regular exercise, adequate sleep. Consider daily probiotics. Take medications as prescribed

## 2024-06-14 NOTE — Assessment & Plan Note (Signed)
 Encourage heart healthy diet such as MIND or DASH diet, increase exercise, avoid trans fats, simple carbohydrates and processed foods, consider a krill or fish or flaxseed oil cap daily.

## 2024-06-14 NOTE — Assessment & Plan Note (Signed)
 Stable on Mounjaro  5 mg weekly injection Minimize simple carbs. Increase exercise as tolerated. Continue current meds  Encouraged DASH or MIND diet, decrease po intake and increase exercise as tolerated. Needs 7-8 hours of sleep nightly. Avoid trans fats, eat small, frequent meals every 4-5 hours with lean proteins, complex carbs and healthy fats. Minimize simple carbs, high fat foods and processed foods

## 2024-06-16 ENCOUNTER — Encounter: Payer: Self-pay | Admitting: Student

## 2024-06-16 ENCOUNTER — Ambulatory Visit (INDEPENDENT_AMBULATORY_CARE_PROVIDER_SITE_OTHER): Payer: PRIVATE HEALTH INSURANCE | Admitting: Student

## 2024-06-16 VITALS — BP 124/78 | HR 84 | Resp 16 | Ht 73.0 in | Wt 287.6 lb

## 2024-06-16 DIAGNOSIS — Z7985 Long-term (current) use of injectable non-insulin antidiabetic drugs: Secondary | ICD-10-CM

## 2024-06-16 DIAGNOSIS — M79671 Pain in right foot: Secondary | ICD-10-CM

## 2024-06-16 DIAGNOSIS — E349 Endocrine disorder, unspecified: Secondary | ICD-10-CM | POA: Diagnosis not present

## 2024-06-16 DIAGNOSIS — Z0000 Encounter for general adult medical examination without abnormal findings: Secondary | ICD-10-CM

## 2024-06-16 DIAGNOSIS — G8929 Other chronic pain: Secondary | ICD-10-CM

## 2024-06-16 DIAGNOSIS — Z0001 Encounter for general adult medical examination with abnormal findings: Secondary | ICD-10-CM

## 2024-06-16 DIAGNOSIS — Z6837 Body mass index (BMI) 37.0-37.9, adult: Secondary | ICD-10-CM

## 2024-06-16 DIAGNOSIS — E785 Hyperlipidemia, unspecified: Secondary | ICD-10-CM | POA: Diagnosis not present

## 2024-06-16 DIAGNOSIS — Z8673 Personal history of transient ischemic attack (TIA), and cerebral infarction without residual deficits: Secondary | ICD-10-CM

## 2024-06-16 DIAGNOSIS — E1169 Type 2 diabetes mellitus with other specified complication: Secondary | ICD-10-CM | POA: Diagnosis not present

## 2024-06-16 DIAGNOSIS — E559 Vitamin D deficiency, unspecified: Secondary | ICD-10-CM | POA: Diagnosis not present

## 2024-06-16 DIAGNOSIS — E669 Obesity, unspecified: Secondary | ICD-10-CM

## 2024-06-16 NOTE — Assessment & Plan Note (Signed)
 Rest & activity modification: Avoid prolonged standing, running, or high-impact activities. Stretching exercises: Daily calf and plantar fascia stretches can relieve tension. Supportive footwear: Wear shoes with good arch support and cushioning; avoid walking barefoot. Orthotics or heel inserts: Help reduce strain on the fascia.

## 2024-06-16 NOTE — Assessment & Plan Note (Signed)
 Referral sent to urology.  Patient also desires to have vasectomy and advised to discuss testosterone  supplementation and vasectomy with urology.

## 2024-06-16 NOTE — Assessment & Plan Note (Signed)
 Hx TIA 2017. Advise Aspirin 81 mg daily for stroke prevention and risk factor modification.  Educated on preventative healthcare measures including managing cholesterol effectively, limiting trans and saturated fats, increasing physical activity.

## 2024-06-17 LAB — CBC WITH DIFFERENTIAL/PLATELET
Basophils Absolute: 0.1 K/uL (ref 0.0–0.1)
Basophils Relative: 1 % (ref 0.0–3.0)
Eosinophils Absolute: 0.2 K/uL (ref 0.0–0.7)
Eosinophils Relative: 2.3 % (ref 0.0–5.0)
HCT: 45.5 % (ref 39.0–52.0)
Hemoglobin: 15.2 g/dL (ref 13.0–17.0)
Lymphocytes Relative: 30.7 % (ref 12.0–46.0)
Lymphs Abs: 2.4 K/uL (ref 0.7–4.0)
MCHC: 33.4 g/dL (ref 30.0–36.0)
MCV: 89.1 fl (ref 78.0–100.0)
Monocytes Absolute: 0.6 K/uL (ref 0.1–1.0)
Monocytes Relative: 8 % (ref 3.0–12.0)
Neutro Abs: 4.6 K/uL (ref 1.4–7.7)
Neutrophils Relative %: 58 % (ref 43.0–77.0)
Platelets: 301 K/uL (ref 150.0–400.0)
RBC: 5.1 Mil/uL (ref 4.22–5.81)
RDW: 13.1 % (ref 11.5–15.5)
WBC: 7.9 K/uL (ref 4.0–10.5)

## 2024-06-17 LAB — MICROALBUMIN / CREATININE URINE RATIO
Creatinine,U: 117.1 mg/dL
Microalb Creat Ratio: 6.6 mg/g (ref 0.0–30.0)
Microalb, Ur: 0.8 mg/dL (ref 0.0–1.9)

## 2024-06-17 LAB — COMPREHENSIVE METABOLIC PANEL WITH GFR
ALT: 31 U/L (ref 0–53)
AST: 19 U/L (ref 0–37)
Albumin: 5.1 g/dL (ref 3.5–5.2)
Alkaline Phosphatase: 49 U/L (ref 39–117)
BUN: 15 mg/dL (ref 6–23)
CO2: 26 meq/L (ref 19–32)
Calcium: 10.6 mg/dL — ABNORMAL HIGH (ref 8.4–10.5)
Chloride: 103 meq/L (ref 96–112)
Creatinine, Ser: 1.08 mg/dL (ref 0.40–1.50)
GFR: 86.67 mL/min (ref >60.00)
Glucose, Bld: 88 mg/dL (ref 70–99)
Potassium: 4.9 meq/L (ref 3.5–5.1)
Sodium: 140 meq/L (ref 135–145)
Total Bilirubin: 1.1 mg/dL (ref 0.2–1.2)
Total Protein: 7.7 g/dL (ref 6.0–8.3)

## 2024-06-17 LAB — LIPID PANEL
Cholesterol: 265 mg/dL — ABNORMAL HIGH (ref 0–200)
HDL: 49.7 mg/dL (ref >39.00)
LDL Cholesterol: 194 mg/dL — ABNORMAL HIGH (ref 0–99)
NonHDL: 214.93
Total CHOL/HDL Ratio: 5
Triglycerides: 103 mg/dL (ref 0.0–149.0)
VLDL: 20.6 mg/dL (ref 0.0–40.0)

## 2024-06-17 LAB — VITAMIN D 25 HYDROXY (VIT D DEFICIENCY, FRACTURES): VITD: 39.35 ng/mL (ref 30.00–100.00)

## 2024-06-18 ENCOUNTER — Ambulatory Visit: Payer: Self-pay | Admitting: Student

## 2024-06-18 DIAGNOSIS — E7849 Other hyperlipidemia: Secondary | ICD-10-CM

## 2024-06-18 DIAGNOSIS — E1169 Type 2 diabetes mellitus with other specified complication: Secondary | ICD-10-CM

## 2024-06-18 MED ORDER — ROSUVASTATIN CALCIUM 20 MG PO TABS: 20.0000 mg | ORAL_TABLET | Freq: Every day | ORAL | 1 refills | Status: DC

## 2024-07-07 ENCOUNTER — Ambulatory Visit: Admitting: Urology

## 2024-07-07 ENCOUNTER — Encounter: Payer: Self-pay | Admitting: Student

## 2024-07-07 ENCOUNTER — Encounter: Payer: Self-pay | Admitting: Urology

## 2024-07-07 VITALS — BP 151/95 | HR 65 | Ht 73.0 in | Wt 280.0 lb

## 2024-07-07 DIAGNOSIS — Z3009 Encounter for other general counseling and advice on contraception: Secondary | ICD-10-CM | POA: Diagnosis not present

## 2024-07-07 DIAGNOSIS — R7989 Other specified abnormal findings of blood chemistry: Secondary | ICD-10-CM | POA: Diagnosis not present

## 2024-07-07 DIAGNOSIS — E1169 Type 2 diabetes mellitus with other specified complication: Secondary | ICD-10-CM

## 2024-07-07 MED ORDER — TIRZEPATIDE 7.5 MG/0.5ML ~~LOC~~ SOAJ: 7.5000 mg | SUBCUTANEOUS | 1 refills | Status: DC

## 2024-07-07 NOTE — Patient Instructions (Signed)
 Vasectomy is a safe, simple, and effective office based procedure that provides men with permanent sterility.  The no scalpel technique has been a refinement but often results in less swelling and pain than the traditional vasectomy method.  Prior to a vasectomy, it is important to make a decision that you are interested in permanent sterility and that you and your partner must be completely sure that you do not want children in the future.  To prepare for the procedure, stop taking any aspirin or blood thinners for 1 week prior to the procedure.  The day of your procedure take a shower and thoroughly clean your scrotum.  It is not necessary to shave prior to the procedure as any shaving that is needed will be performed in the office.  Bring a pair of tight underwear such as briefs, boxer briefs, or athletic shorts with you for the day of the procedure.  You may eat a light meal prior to the procedure.  During the procedure, the scrotal skin will be sterilized completely.  Anesthesia will be provided by injection of a local anesthetic into the scrotum which will provide anesthesia for 2-3 hours after the procedure.  Once the local anesthetic takes effect, a tiny puncture is made in the middle of the front of the scrotum through which both vasa deferens tubes can be partially removed and the ends of the tubes cauterized.  A small absorbable suture is placed in the skin incision.  Antibiotic ointment and sterile gauze dressings are applied and are held in place with the undergarment.  Prescriptions for an antibiotic and pain medication will be sent to your pharmacy.  The antibiotic should be taken to completion.  The pain medication can be taken as needed every 4-6 hours.  If a narcotic pain medication is too strong, over-the-counter analgesics such as Tylenol or ibuprofen may be taken instead.  After the procedure, it is important to take it easy for a couple of days and apply the ice pack to the scrotal area.   The ice pack goes on top of the undergarment and should be used for 10-15 minutes at a time while you are awake.  After the first 2 days, you can gradually increase your activity.  During the first week, it is important to avoid strenuous activity or activity that puts pressure in the scrotal area.  It is also advisable to restrain from heavy lifting or long distance running during this time period.  Often, supportive underwear is helpful to reduce pain during the first week.  You should also avoid sexual activity for 10 days.  You can resume normal activities after 1 week and sexual relations in 10 days.  One of the most important considerations after vasectomy is that you are still fertile after the procedure.  It generally takes at least 20 ejaculations and 3 months time before you are considered sterile.  Even 3 months after the procedure, some men will have a few persistent sperm present.  To be considered sterile, you will need to produce a semen sample that shows no sperm.  You will receive instructions to bring in your first semen specimen to the office 3 months after the procedure.  It is very important that you continue to use your current method of birth control during this time as it is possible to achieve a pregnancy until you become sterile.  Potential complications of vasectomy include bleeding (less than 1% risk of serious bleeding), infection (less than 1%), reconnection of  the vas deferens (10/998 chance), pregnancy (10/1998 chance), shrinkage of the testicle (10/4998 chance), and chronic pain (2-4%).  Other potential consequences of vasectomy include sperm granuloma (a small round area of scar tissue in the area of the procedure is performed), and congestion of the epididymis (a fullness of the tubes where sperm are stored).  Sperm will continue to be produced by the testicles, but the sperm will eventually die and be absorbed by the body.  The amount of semen that is produced will not change.   The main difference is that the semen will not contain sperm after a man has become sterile.  The procedure does not affect your urination, sex drive, or erections.  In summary, vasectomy provides permanent birth control for men.  It is an office-based procedure which is safe, effective, and economical.  After the procedure, it takes time to become sterile so proper precautions must be taken until sterility is achieved.    Taking care of yourself after a VASECTOMY                                              Patient Information Sheet        The following information will reinforce some of the instructions that your doctor has given you.  Day of Procedure: 1) Wear the scrotal supporter and gauze pad 2) Use an ice pack on the scrotum for 15 minutes every hour for 48 hours to help reduce discomfort, swelling and bruising (do NOT place ice directly on your skin, but place on top of the supporter) 3) Expect some clear to pinkish drainage at the surgical site for the first 24-48 hours 4) If needed, use pain medications provided or ibuprofen 800 mg every 8 hours for discomfort 5) Avoid strenuous activities like mowing, lifting, jogging and exercising for 1 week.  Take it easy! 6) If you develop a fever over 101 F or sudden onset of significant swelling within the first 12 hours, please call to report this to your doctor as soon as possible.     Day Two and Three: 1) You may take a shower, but avoid tubs, pools or hot tubs. 2) Continue to wear the scrotal supporter as needed for comfort and change or remove the gauze pad if desired 3) Keep taking it easy!  Avoid strenuous activities like mowing, lifting, jogging and exercising.   4) Continue to watch for signs or symptoms of fever or significant swelling 5) Apply a small amount of antibiotic ointment to incision 1-2 times/day  The rest of the week: 1) Gradually return to normal physical activities after one week.  A return of soreness might  mean you are        "doing too much too soon". 2) Avoid sexual activity for 10 days after the procedure 3) Continue to take a shower, but avoid tubs, pools or hot tubs 4) Wearing the scrotal supporter is optional based on your comfort.     Remember to use an alternate form of contraception for 3 months until you have been checked and CLEARED by your urologist!  62-Month lab appointment:  1) The lab technician will need to look at a semen sample under a microscope  2) Use the specimen cup provided to collect the sample AT HOME 1 hour before the appointment  3) DO NOT refrigerate the specimen, but keep  at room or body temperature  4) Avoid ejaculation for 2-5 days before collecting the specimen  5) Collect the entire specimen by masturbation using NO lubricant  6) Make sure your name, MR number, date and time of collection are on the cup

## 2024-07-07 NOTE — Progress Notes (Signed)
 Assessment: 1. Encounter for vasectomy assessment   2. Low testosterone      Plan: I personally reviewed the patient's chart including provider notes, and lab results. Labs today:  testosterone , albumin, SHBG, estradiol - will call with results Schedule for vasectomy per patient request He declined rx for alprazolam   Chief Complaint:  Chief Complaint  Patient presents with   VAS Consult   Hypogonadism    History of Present Illness:  Jared Ramsey is a 37 y.o. male who is seen in consultation from Yacopino, Jessica L, NP for evaluation of possible low testosterone  and vasectomy evaluation. He reports fatigue, lack of energy, decreased strength and difficulty with weight loss.  No decreased libido.  He has noted some erectile dysfunction.  Testosterone  levels: 2/22 180 10/22 159 8/25 357.6 - drawn in PM   He is married with 2 children.  No history of scrotal trauma or infection.  Past Medical History:  Past Medical History:  Diagnosis Date   Alcohol consumption binge drinking 07/17/2018   12-24 beers on the weekends   Diabetes mellitus without complication [facility])    Former smoker (quit 07/2018, 7.5 pack year history) 07/17/2018   History of COVID-19 11/15/2019   History of CVA (cerebrovascular accident) 07/16/2018   left hemispheric TIA as well as previous episode of likely right cerebellar infarct due to cerebral reversible vasoconstriction syndrome.  Both episodes appear to be triggered by using phentermine and Sudafed.  Continue aspirin for stroke prevention and aggressive risk factor modification. Check polysomnogram for sleep apnea and transcranial Doppler bubble study for PFO.  Check lipid profile, hemo   Hypertension    Morbid obesity (HCC)    Stroke [facility])    mini stroke   Vitamin D  deficiency 03/08/2021    Past Surgical History:  Past Surgical History:  Procedure Laterality Date   PLANTAR'S WART EXCISION Left 2003   TONSILLECTOMY      Allergies:   Allergies  Allergen Reactions   Bisoprolol      Numbness, tingling, feels bad   Codeine Swelling    Family History:  Family History  Problem Relation Age of Onset   Diabetes Mother        resolved post gastric bypass   Lymphoma Mother    Hypertension Mother    Hyperlipidemia Father    Hypertension Father    Diabetes Sister    Hypertension Sister    Epilepsy Sister    Alzheimer's disease Maternal Grandmother    Diabetes Maternal Grandmother    Hypertension Maternal Grandmother    Alzheimer's disease Paternal Grandmother    Emphysema Paternal Grandmother        smoker   Hypertension Paternal Grandfather    Hyperlipidemia Paternal Grandfather     Social History:  Social History   Tobacco Use   Smoking status: Former    Current packs/day: 0.00    Average packs/day: 0.5 packs/day for 15.0 years (7.5 ttl pk-yrs)    Types: Cigarettes    Start date: 07/15/2003    Quit date: 07/14/2018    Years since quitting: 5.9   Smokeless tobacco: Never  Vaping Use   Vaping status: Former  Substance Use Topics   Alcohol use: Yes    Alcohol/week: 6.0 standard drinks of alcohol    Types: 6 Standard drinks or equivalent per week   Drug use: Never    Review of symptoms:  Constitutional:  Negative for unexplained weight loss, night sweats, fever, chills ENT:  Negative for nose bleeds, sinus  pain, painful swallowing CV:  Negative for chest pain, shortness of breath, exercise intolerance, palpitations, loss of consciousness Resp:  Negative for cough, wheezing, shortness of breath GI:  Negative for nausea, vomiting, diarrhea, bloody stools GU:  Positives noted in HPI; otherwise negative for gross hematuria, dysuria, urinary incontinence Neuro:  Negative for seizures, poor balance, limb weakness, slurred speech Psych:  Negative for lack of energy, depression, anxiety Endocrine:  Negative for polydipsia, polyuria, symptoms of hypoglycemia (dizziness, hunger, sweating) Hematologic:   Negative for anemia, purpura, petechia, prolonged or excessive bleeding, use of anticoagulants  Allergic:  Negative for difficulty breathing or choking as a result of exposure to anything; no shellfish allergy; no allergic response (rash/itch) to materials, foods  Physical exam: BP (!) 151/95   Pulse 65   Ht 6' 1 (1.854 m)   Wt 280 lb (127 kg)   BMI 36.94 kg/m  GENERAL APPEARANCE:  Well appearing, well developed, well nourished, NAD HEENT:  Atraumatic, normocephalic, oropharynx clear NECK:  Supple without lymphadenopathy or thyromegaly ABDOMEN:  Soft, non-tender, no masses EXTREMITIES:  Moves all extremities well, without clubbing, cyanosis, or edema NEUROLOGIC:  Alert and oriented x 3, normal gait, CN II-XII grossly intact MENTAL STATUS:  appropriate BACK:  Non-tender to palpation, No CVAT SKIN:  Warm, dry, and intact GU: Penis:  circumcised Meatus: Normal Scrotum: Vas palpated bilaterally Testis: normal without masses bilateral Epididymis: normal   Results: None   VASECTOMY CONSULTATION  Jared Ramsey presents for vasectomy consultation today.  He is a 38 y.o. male, Married with 2 children.  He and his wife have discussed the issues regarding long-term fertility and are comfortable with this decision.  He presents for consideration for vasectomy.  I discussed the issues in detail with him today and he expressed no reservations.  As to the procedure, no scalpel technique vasectomy is explained and reviewed in detail.  Generalized risks including but not limited to bleeding, infection, orchalgia, testicular atrophy, epididymitis, scrotal hematoma, and chronic pain are discussed.   Additionally, he understands that the possibility of vas recanalization following vasectomy is possible although rare.  Most importantly, the patient understands that he is not sterile initially and will need a semen analysis check to confirm sterility such that no sperm are seen.  He is advised to  avoid ejaculation for 10 days following the procedure.  The initial semen analysis will be checked in approximately 12 weeks and in some patients, several months may be required for clearance of all sperm.  He reports a clear understanding of the need for continued birth control until sterility is confirmed.  Otherwise, general issues regarding local anesthesia, prep, alprazolam are discussed and he reports a clear understanding.

## 2024-07-08 ENCOUNTER — Ambulatory Visit: Payer: Self-pay | Admitting: Urology

## 2024-07-08 LAB — SEX HORMONE BINDING GLOBULIN: Sex Hormone Binding: 17.5 nmol/L (ref 16.5–55.9)

## 2024-07-08 LAB — ALBUMIN: Albumin: 5.1 g/dL (ref 4.1–5.1)

## 2024-07-08 LAB — TESTOSTERONE: Testosterone: 395 ng/dL (ref 264–916)

## 2024-07-08 LAB — ESTRADIOL: Estradiol: 27.4 pg/mL (ref 7.6–42.6)

## 2024-08-05 ENCOUNTER — Encounter: Payer: Self-pay | Admitting: Student

## 2024-08-05 DIAGNOSIS — E1169 Type 2 diabetes mellitus with other specified complication: Secondary | ICD-10-CM

## 2024-09-08 ENCOUNTER — Other Ambulatory Visit (INDEPENDENT_AMBULATORY_CARE_PROVIDER_SITE_OTHER)

## 2024-09-08 DIAGNOSIS — E1169 Type 2 diabetes mellitus with other specified complication: Secondary | ICD-10-CM

## 2024-09-08 DIAGNOSIS — E7849 Other hyperlipidemia: Secondary | ICD-10-CM

## 2024-09-08 LAB — LIPID PANEL
Cholesterol: 248 mg/dL — ABNORMAL HIGH (ref 0–200)
HDL: 50.7 mg/dL (ref >39.00)
LDL Cholesterol: 138 mg/dL — ABNORMAL HIGH (ref 0–99)
NonHDL: 197.18
Total CHOL/HDL Ratio: 5
Triglycerides: 298 mg/dL — ABNORMAL HIGH (ref 0.0–149.0)
VLDL: 59.6 mg/dL — ABNORMAL HIGH (ref 0.0–40.0)

## 2024-09-08 LAB — HEMOGLOBIN A1C: Hgb A1c MFr Bld: 6.2 % (ref 4.6–6.5)

## 2024-09-09 ENCOUNTER — Ambulatory Visit: Payer: Self-pay | Admitting: Student

## 2024-09-09 ENCOUNTER — Encounter: Payer: Self-pay | Admitting: Student

## 2024-09-10 ENCOUNTER — Encounter: Admitting: Urology

## 2024-09-13 NOTE — Assessment & Plan Note (Signed)
 Hx TIA 2017. Advise Aspirin 81 mg daily, Pt agreeable to this.  On statin Educated on preventative healthcare measures including managing cholesterol effectively, limiting trans and saturated fats, increasing physical activity.

## 2024-09-13 NOTE — Assessment & Plan Note (Signed)
 Managed with Mounjaro . Increase to 10 mg inj weekly. Start ace-I, lisinopril  2.5 mg daily, Recheck BMP in 2 weeks On statin hgba1c acceptable, minimize simple carbs. Increase exercise as tolerated. Continue current meds.

## 2024-09-13 NOTE — Assessment & Plan Note (Signed)
 Per US  10/2020. Recommendations from US - was to follow up with non emergent MRI of the liver with and without contrast. His most recent AST and ALT were within normal limits, and he is currently asymptomatic. He is actively working on weight loss and increasing physical activity. After discussion, the patient elected to defer MRI at this time. Monitor hepatic panel q 6 months

## 2024-09-13 NOTE — Progress Notes (Unsigned)
 Subjective:     Patient ID: Jared Ramsey, male    DOB: 1986/01/25, 38 y.o.   MRN: [id number]  No chief complaint on file.   HPI  Jared Ramsey is a 38 year old male with DM, Obesity, Hx TIA presents for follow up  Add ASA daily? Hx of stroke?? *increase crestor  to 40 mg-- d/t stroke and DM*** History prostate cancer family? If not add to negative Hx  2022- US  abdomen reccomends Recommend non emergent MRI of the liver with and without contrast. -fatty liver?   DM II 7.5 mg Mounjaro  injection weekly Wt Readings from Last 3 Encounters:  07/07/24 280 lb (127 kg)  06/16/24 287 lb 9.6 oz (130.5 kg)  05/13/24 291 lb 12.8 oz (132.4 kg)    HTN-  Well controlled, BP controlled BP Readings from Last 3 Encounters:  07/07/24 (!) 151/95  06/16/24 124/78  05/13/24 138/86     HLD- rosuvastatin  20 mg    Asthma Albuterol  PRN, no recent exacerbations      Patient is taking medication as prescribed and reports no adverse side effects.   Patient denies fever, chills, SOB, CP, palpitations, dyspnea, edema, HA, vision changes, N/V/D, abdominal pain, urinary symptoms, rash, weight changes, and recent illness or hospitalizations.     History of Present Illness              Health Maintenance Due  Topic Date Due   OPHTHALMOLOGY EXAM  Never done   Hepatitis C Screening  Never done   HPV VACCINES (1 - 3-dose SCDM series) Never done   Influenza Vaccine  05/07/2024    Past Medical History:  Diagnosis Date   Alcohol consumption binge drinking 07/17/2018   12-24 beers on the weekends   Diabetes mellitus without complication (HCC)    Former smoker (quit 07/2018, 7.5 pack year history) 07/17/2018   History of COVID-19 11/15/2019   History of CVA (cerebrovascular accident) 07/16/2018   left hemispheric TIA as well as previous episode of likely right cerebellar infarct due to cerebral reversible vasoconstriction syndrome.  Both episodes appear to be triggered by using  phentermine and Sudafed.  Continue aspirin for stroke prevention and aggressive risk factor modification. Check polysomnogram for sleep apnea and transcranial Doppler bubble study for PFO.  Check lipid profile, hemo   Hypertension    Morbid obesity (HCC)    Stroke (HCC)    mini stroke   Vitamin D  deficiency 03/08/2021    Past Surgical History:  Procedure Laterality Date   PLANTAR'S WART EXCISION Left 2003   TONSILLECTOMY      Family History  Problem Relation Age of Onset   Diabetes Mother        resolved post gastric bypass   Lymphoma Mother    Hypertension Mother    Hyperlipidemia Father    Hypertension Father    Diabetes Sister    Hypertension Sister    Epilepsy Sister    Alzheimer's disease Maternal Grandmother    Diabetes Maternal Grandmother    Hypertension Maternal Grandmother    Alzheimer's disease Paternal Grandmother    Emphysema Paternal Grandmother        smoker   Hypertension Paternal Grandfather    Hyperlipidemia Paternal Grandfather     Social History   Socioeconomic History   Marital status: Married    Spouse name: Not on file   Number of children: 1   Years of education: Not on file   Highest education level: Associate degree: occupational, scientist, product/process development,  or vocational program  Occupational History   Occupation: airline pilot  Tobacco Use   Smoking status: Former    Current packs/day: 0.00    Average packs/day: 0.5 packs/day for 15.0 years (7.5 ttl pk-yrs)    Types: Cigarettes    Start date: 07/15/2003    Quit date: 07/14/2018    Years since quitting: 6.1   Smokeless tobacco: Never  Vaping Use   Vaping status: Former  Substance and Sexual Activity   Alcohol use: Yes    Alcohol/week: 6.0 standard drinks of alcohol    Types: 6 Standard drinks or equivalent per week   Drug use: Never   Sexual activity: Yes    Partners: Female  Other Topics Concern   Not on file  Social History Narrative   Not on file   Social Drivers of Health    Financial Resource Strain: Patient Declined (05/12/2024)   Overall Financial Resource Strain (CARDIA)    Difficulty of Paying Living Expenses: Patient declined  Food Insecurity: Patient Declined (05/12/2024)   Hunger Vital Sign    Worried About Running Out of Food in the Last Year: Patient declined    Ran Out of Food in the Last Year: Patient declined  Transportation Needs: Patient Declined (05/12/2024)   PRAPARE - Administrator, Civil Service (Medical): Patient declined    Lack of Transportation (Non-Medical): Patient declined  Physical Activity: Sufficiently Active (05/12/2024)   Exercise Vital Sign    Days of Exercise per Week: 5 days    Minutes of Exercise per Session: 60 min  Stress: No Stress Concern Present (05/12/2024)   [employer] of Occupational Health - Occupational Stress Questionnaire    Feeling of Stress: Not at all  Social Connections: Moderately Integrated (05/12/2024)   Social Connection and Isolation Panel    Frequency of Communication with Friends and Family: More than three times a week    Frequency of Social Gatherings with Friends and Family: More than three times a week    Attends Religious Services: 1 to 4 times per year    Active Member of [employer] or Organizations: No    Attends Engineer, Structural: Not on file    Marital Status: Married  Catering Manager Violence: Not on file    Outpatient Medications Prior to Visit  Medication Sig Dispense Refill   albuterol  (VENTOLIN  HFA) 108 (90 Base) MCG/ACT inhaler Inhale 2 puffs into the lungs every 6 (six) hours as needed for wheezing or shortness of breath. 8 g 2   meloxicam  (MOBIC ) 15 MG tablet Take 1 tablet (15 mg total) by mouth daily. 30 tablet 2   rosuvastatin  (CRESTOR ) 20 MG tablet Take 1 tablet (20 mg total) by mouth daily. 90 tablet 1   tirzepatide  (MOUNJARO ) 7.5 MG/0.5ML Pen Inject 7.5 mg into the skin once a week. 2 mL 1   No facility-administered medications prior to visit.     Allergies  Allergen Reactions   Bisoprolol      Numbness, tingling, feels bad   Codeine Swelling    ROS     Objective:    Physical Exam Constitutional:      General: He is not in acute distress.    Appearance: He is obese. He is not ill-appearing, toxic-appearing or diaphoretic.  HENT:     Head: Normocephalic and atraumatic.     Right Ear: Tympanic membrane, ear canal and external ear normal.     Left Ear: Tympanic membrane, ear canal and external ear normal.  Nose: Nose normal. No congestion.     Mouth/Throat:     Mouth: Mucous membranes are moist.     Pharynx: Oropharynx is clear.  Eyes:     Extraocular Movements: Extraocular movements intact.     Right eye: Normal extraocular motion.     Left eye: Normal extraocular motion.     Conjunctiva/sclera: Conjunctivae normal.     Pupils: Pupils are equal, round, and reactive to light.  Neck:     Thyroid : No thyroid  mass or thyromegaly.     Vascular: No carotid bruit or JVD.  Cardiovascular:     Rate and Rhythm: Normal rate and regular rhythm.     Pulses: Normal pulses.          Radial pulses are 2+ on the right side and 2+ on the left side.       Dorsalis pedis pulses are 2+ on the right side and 2+ on the left side.     Heart sounds: Normal heart sounds, S1 normal and S2 normal. No murmur heard.    No friction rub. No gallop.  Pulmonary:     Effort: Pulmonary effort is normal. No respiratory distress.     Breath sounds: Normal breath sounds.  Abdominal:     General: Bowel sounds are normal. There is no distension.     Palpations: Abdomen is soft.     Tenderness: There is no abdominal tenderness. There is no guarding.  Musculoskeletal:        General: Normal range of motion.     Cervical back: Full passive range of motion without pain and normal range of motion. No edema or erythema.     Right lower leg: No edema.     Left lower leg: No edema.  Lymphadenopathy:     Cervical: No cervical adenopathy.  Skin:     General: Skin is warm and dry.     Capillary Refill: Capillary refill takes less than 2 seconds.     Comments: Lipomas b/l LE- lateral  Neurological:     General: No focal deficit present.     Mental Status: He is alert and oriented to person, place, and time.     Cranial Nerves: No cranial nerve deficit.     Motor: No weakness.     Coordination: Coordination normal.     Gait: Gait normal.     Deep Tendon Reflexes: Reflexes normal.  Psychiatric:        Mood and Affect: Mood normal.        Behavior: Behavior normal.        Thought Content: Thought content normal.       There were no vitals taken for this visit. Wt Readings from Last 3 Encounters:  07/07/24 280 lb (127 kg)  06/16/24 287 lb 9.6 oz (130.5 kg)  05/13/24 291 lb 12.8 oz (132.4 kg)       Assessment & Plan:   Problem List Items Addressed This Visit     Diabetes mellitus (HCC) - Primary   Managed with Mounjaro  On statin hgba1c acceptable, minimize simple carbs. Increase exercise as tolerated. Continue current meds.      Fatty liver   Per US  10/2020. Recommendations from US - was to follow up with non emergent MRI of the liver with and without contrast. Discussed with patient he would like to ????????????? at this time      History of CVA (cerebrovascular accident)   Hx TIA 2017. Advise Aspirin 81 mg daily for stroke prevention  and risk factor modification.  Educated on preventative healthcare measures including managing cholesterol effectively, limiting trans and saturated fats, increasing physical activity.      Hyperlipidemia associated with type 2 diabetes mellitus (HCC)   Goal LDL >70 Tolerating statin. Increase crestor  to 40 mg daily. Encourage heart healthy diet such as MIND or DASH diet, increase exercise, avoid trans fats, simple carbohydrates and processed foods, consider a krill or fish or flaxseed oil cap daily.   Repeat lipid panel in 3 months.      Hypertension       Right plantar  fasciitis Chronic right plantar fasciitis, occurring over the past 6 months with morning heel pain and intermittent pain. Conservative treatment partially effective. Discussed daily stretching exercises and tennis ball massage. Referral to Sports Medicine may be necessary if symptoms persist despite conservative management.  - Recommend daily stretching exercises and use of a tennis ball for massage. - Continue ice application and ibuprofen as needed. - Consider referral to Sports Medicine if symptoms persist.   I am having Garnette CHARM Daubs maintain his albuterol , meloxicam , rosuvastatin , and tirzepatide .  No orders of the defined types were placed in this encounter.

## 2024-09-13 NOTE — Assessment & Plan Note (Addendum)
 Goal LDL >70 Tolerating statin. Increase crestor  to 40 mg daily. Encourage heart healthy diet such as MIND or DASH diet, increase exercise, avoid trans fats, simple carbohydrates and processed foods, consider a krill or fish or flaxseed oil cap daily.   Repeat lipid panel in 3 months.

## 2024-09-15 ENCOUNTER — Encounter: Payer: Self-pay | Admitting: Student

## 2024-09-15 ENCOUNTER — Ambulatory Visit: Admitting: Student

## 2024-09-15 VITALS — BP 123/74 | HR 78 | Ht 73.0 in | Wt 294.4 lb

## 2024-09-15 DIAGNOSIS — I10 Essential (primary) hypertension: Secondary | ICD-10-CM

## 2024-09-15 DIAGNOSIS — Z8673 Personal history of transient ischemic attack (TIA), and cerebral infarction without residual deficits: Secondary | ICD-10-CM

## 2024-09-15 DIAGNOSIS — Z7985 Long-term (current) use of injectable non-insulin antidiabetic drugs: Secondary | ICD-10-CM

## 2024-09-15 DIAGNOSIS — E785 Hyperlipidemia, unspecified: Secondary | ICD-10-CM

## 2024-09-15 DIAGNOSIS — M546 Pain in thoracic spine: Secondary | ICD-10-CM | POA: Insufficient documentation

## 2024-09-15 DIAGNOSIS — E1169 Type 2 diabetes mellitus with other specified complication: Secondary | ICD-10-CM | POA: Diagnosis not present

## 2024-09-15 DIAGNOSIS — Z23 Encounter for immunization: Secondary | ICD-10-CM | POA: Diagnosis not present

## 2024-09-15 DIAGNOSIS — K760 Fatty (change of) liver, not elsewhere classified: Secondary | ICD-10-CM | POA: Diagnosis not present

## 2024-09-15 DIAGNOSIS — K921 Melena: Secondary | ICD-10-CM | POA: Insufficient documentation

## 2024-09-15 DIAGNOSIS — Z8042 Family history of malignant neoplasm of prostate: Secondary | ICD-10-CM | POA: Diagnosis not present

## 2024-09-15 DIAGNOSIS — E559 Vitamin D deficiency, unspecified: Secondary | ICD-10-CM | POA: Diagnosis not present

## 2024-09-15 DIAGNOSIS — Z125 Encounter for screening for malignant neoplasm of prostate: Secondary | ICD-10-CM

## 2024-09-15 MED ORDER — LISINOPRIL 2.5 MG PO TABS: 2.5000 mg | ORAL_TABLET | Freq: Every day | ORAL | 1 refills | Status: DC

## 2024-09-15 MED ORDER — MELOXICAM 7.5 MG PO TABS: 7.5000 mg | ORAL_TABLET | Freq: Every day | ORAL | 0 refills | Status: AC

## 2024-09-15 MED ORDER — LISINOPRIL 2.5 MG PO TABS: 2.5000 mg | ORAL_TABLET | Freq: Every day | ORAL | 1 refills | Status: AC

## 2024-09-15 MED ORDER — ASPIRIN 81 MG PO TBEC: 81.0000 mg | DELAYED_RELEASE_TABLET | Freq: Every day | ORAL | Status: AC

## 2024-09-15 MED ORDER — ROSUVASTATIN CALCIUM 40 MG PO TABS: 40.0000 mg | ORAL_TABLET | Freq: Every day | ORAL | 3 refills | Status: AC

## 2024-09-15 MED ORDER — TIRZEPATIDE 10 MG/0.5ML ~~LOC~~ SOAJ: 10.0000 mg | SUBCUTANEOUS | 1 refills | Status: AC

## 2024-09-15 NOTE — Assessment & Plan Note (Signed)
 Intermittent bleeding with BM, Pt deniesexternal hemorrhoids or significant pain. Consider GI referral if bleeding persists. CBC stable. - Ordered FOBT - Advised use daily probiotics -Encourage adequate hydration with water, increase fiber in the diet.    Intermittent bleeding likely due to dietary habits. No external hemorrhoids or significant pain. Discussed potential GI referral if bleeding persists. - Ordered fecal blood test. - Advised use of stool softeners and daily probiotics. - Recommended increased water intake and use of a Squatty Potty. - Instructed to monitor bleeding and report if it persists.

## 2024-09-15 NOTE — Assessment & Plan Note (Signed)
 Supplement and Monitor

## 2024-09-15 NOTE — Assessment & Plan Note (Signed)
 Likely muscular strain,  - Rx- meloxicam  7.5 mg prn. Medication and common side effects reviewed with the patient; patient voiced understanding and had no further questions at this time.  - May use ice/heat, lidocaine patches as needed

## 2024-09-15 NOTE — Assessment & Plan Note (Addendum)
 Check PSA. ?

## 2024-10-04 ENCOUNTER — Encounter: Payer: No Typology Code available for payment source | Admitting: Nurse Practitioner

## 2024-12-03 ENCOUNTER — Encounter: Admitting: Urology

## 2024-12-15 ENCOUNTER — Ambulatory Visit: Admitting: Student
